# Patient Record
Sex: Female | Born: 1945 | Race: White | Hispanic: No | Marital: Married | Smoking: Former smoker
Health system: Southern US, Community
[De-identification: ages and names within clinical notes are randomized; demographics above are authoritative.]

## PROBLEM LIST (undated history)

## (undated) DIAGNOSIS — R079 Chest pain, unspecified: Secondary | ICD-10-CM

## (undated) DIAGNOSIS — Z86010 Personal history of colonic polyps: Secondary | ICD-10-CM

## (undated) DIAGNOSIS — G629 Polyneuropathy, unspecified: Secondary | ICD-10-CM

## (undated) DIAGNOSIS — E7800 Pure hypercholesterolemia, unspecified: Secondary | ICD-10-CM

## (undated) DIAGNOSIS — R0609 Other forms of dyspnea: Secondary | ICD-10-CM

## (undated) DIAGNOSIS — R0989 Other specified symptoms and signs involving the circulatory and respiratory systems: Secondary | ICD-10-CM

## (undated) DIAGNOSIS — H9319 Tinnitus, unspecified ear: Secondary | ICD-10-CM

## (undated) DIAGNOSIS — K5790 Diverticulosis of intestine, part unspecified, without perforation or abscess without bleeding: Secondary | ICD-10-CM

## (undated) DIAGNOSIS — N959 Unspecified menopausal and perimenopausal disorder: Secondary | ICD-10-CM

## (undated) DIAGNOSIS — G9009 Other idiopathic peripheral autonomic neuropathy: Secondary | ICD-10-CM

## (undated) HISTORY — DX: Other forms of dyspnea: R06.09

## (undated) HISTORY — PX: ABDOMINAL HYSTERECTOMY: SHX81

## (undated) HISTORY — PX: LUMBAR LAMINECTOMY: SHX95

## (undated) HISTORY — DX: Diverticulosis of intestine, part unspecified, without perforation or abscess without bleeding: K57.90

## (undated) HISTORY — DX: Pure hypercholesterolemia, unspecified: E78.00

## (undated) HISTORY — DX: Chest pain, unspecified: R07.9

## (undated) HISTORY — PX: NASAL SINUS SURGERY: SHX719

## (undated) HISTORY — DX: Other specified symptoms and signs involving the circulatory and respiratory systems: R09.89

## (undated) HISTORY — DX: Unspecified menopausal and perimenopausal disorder: N95.9

## (undated) HISTORY — DX: Tinnitus, unspecified ear: H93.19

## (undated) HISTORY — DX: Personal history of colonic polyps: Z86.010

## (undated) HISTORY — PX: SHOULDER SURGERY: SHX246

## (undated) HISTORY — DX: Other idiopathic peripheral autonomic neuropathy: G90.09

## (undated) HISTORY — DX: Polyneuropathy, unspecified: G62.9

---

## 1999-12-13 ENCOUNTER — Encounter: Payer: Self-pay | Admitting: Neurosurgery

## 1999-12-13 ENCOUNTER — Ambulatory Visit (HOSPITAL_COMMUNITY): Admission: RE | Admit: 1999-12-13 | Discharge: 1999-12-13 | Payer: Self-pay | Admitting: Neurosurgery

## 2000-01-03 ENCOUNTER — Encounter: Payer: Self-pay | Admitting: Neurosurgery

## 2000-01-07 ENCOUNTER — Encounter: Payer: Self-pay | Admitting: Neurosurgery

## 2000-01-07 ENCOUNTER — Inpatient Hospital Stay (HOSPITAL_COMMUNITY): Admission: RE | Admit: 2000-01-07 | Discharge: 2000-01-08 | Payer: Self-pay | Admitting: Neurosurgery

## 2005-01-02 ENCOUNTER — Ambulatory Visit: Payer: Self-pay | Admitting: Internal Medicine

## 2005-01-31 ENCOUNTER — Ambulatory Visit: Payer: Self-pay | Admitting: Internal Medicine

## 2005-08-01 ENCOUNTER — Ambulatory Visit: Payer: Self-pay | Admitting: Internal Medicine

## 2006-02-05 ENCOUNTER — Ambulatory Visit: Payer: Self-pay | Admitting: Internal Medicine

## 2006-11-20 ENCOUNTER — Ambulatory Visit: Payer: Self-pay | Admitting: Internal Medicine

## 2006-11-20 LAB — CONVERTED CEMR LAB
ALT: 19 units/L (ref 0–40)
AST: 23 units/L (ref 0–37)
Albumin: 3.6 g/dL (ref 3.5–5.2)
Alkaline Phosphatase: 59 units/L (ref 39–117)
BUN: 16 mg/dL (ref 6–23)
Basophils Absolute: 0.1 10*3/uL (ref 0.0–0.1)
Basophils Relative: 1.2 % — ABNORMAL HIGH (ref 0.0–1.0)
Bilirubin, Direct: 0.1 mg/dL (ref 0.0–0.3)
CO2: 28 meq/L (ref 19–32)
Calcium: 10.9 mg/dL — ABNORMAL HIGH (ref 8.4–10.5)
Chloride: 104 meq/L (ref 96–112)
Cholesterol: 241 mg/dL (ref 0–200)
Creatinine, Ser: 0.7 mg/dL (ref 0.4–1.2)
Direct LDL: 152.7 mg/dL
Eosinophils Absolute: 0.2 10*3/uL (ref 0.0–0.6)
Eosinophils Relative: 3.6 % (ref 0.0–5.0)
GFR calc Af Amer: 109 mL/min
GFR calc non Af Amer: 90 mL/min
Glucose, Bld: 98 mg/dL (ref 70–99)
HCT: 34.9 % — ABNORMAL LOW (ref 36.0–46.0)
HDL: 59.4 mg/dL (ref >39.0)
Hemoglobin: 12.2 g/dL (ref 12.0–15.0)
Lymphocytes Relative: 36.5 % (ref 12.0–46.0)
MCHC: 34.9 g/dL (ref 30.0–36.0)
MCV: 91.1 fL (ref 78.0–100.0)
Monocytes Absolute: 0.6 10*3/uL (ref 0.2–0.7)
Monocytes Relative: 9.8 % (ref 3.0–11.0)
Neutro Abs: 3.2 10*3/uL (ref 1.4–7.7)
Neutrophils Relative %: 48.9 % (ref 43.0–77.0)
Platelets: 235 10*3/uL (ref 150–400)
Potassium: 4.8 meq/L (ref 3.5–5.1)
RBC: 3.83 M/uL — ABNORMAL LOW (ref 3.87–5.11)
RDW: 12.9 % (ref 11.5–14.6)
Sodium: 144 meq/L (ref 135–145)
TSH: 3.59 microintl units/mL (ref 0.35–5.50)
Total Bilirubin: 1.1 mg/dL (ref 0.3–1.2)
Total CHOL/HDL Ratio: 4.1
Total Protein: 7.1 g/dL (ref 6.0–8.3)
Triglycerides: 213 mg/dL (ref 0–149)
VLDL: 43 mg/dL — ABNORMAL HIGH (ref 0–40)
WBC: 6.4 10*3/uL (ref 4.5–10.5)

## 2006-11-27 ENCOUNTER — Ambulatory Visit: Payer: Self-pay | Admitting: Internal Medicine

## 2006-12-15 ENCOUNTER — Ambulatory Visit: Payer: Self-pay | Admitting: Internal Medicine

## 2006-12-15 LAB — CONVERTED CEMR LAB
Gamma Globulin: 15.9 % (ref 11.1–18.8)
Hgb A1c MFr Bld: 5.8 % (ref 4.6–6.0)
Vitamin B-12: 528 pg/mL (ref 211–911)

## 2006-12-17 ENCOUNTER — Encounter: Payer: Self-pay | Admitting: Internal Medicine

## 2007-01-15 ENCOUNTER — Ambulatory Visit: Payer: Self-pay | Admitting: Internal Medicine

## 2007-05-24 DIAGNOSIS — Z860100 Personal history of colon polyps, unspecified: Secondary | ICD-10-CM | POA: Insufficient documentation

## 2007-05-24 HISTORY — DX: Personal history of colonic polyps: Z86.010

## 2007-05-24 HISTORY — DX: Personal history of colon polyps, unspecified: Z86.0100

## 2007-06-06 ENCOUNTER — Emergency Department (HOSPITAL_COMMUNITY): Admission: EM | Admit: 2007-06-06 | Discharge: 2007-06-06 | Payer: Self-pay | Admitting: Family Medicine

## 2007-06-21 ENCOUNTER — Telehealth: Payer: Self-pay | Admitting: Internal Medicine

## 2007-06-28 ENCOUNTER — Telehealth: Payer: Self-pay | Admitting: Internal Medicine

## 2007-08-27 ENCOUNTER — Telehealth: Payer: Self-pay | Admitting: Internal Medicine

## 2007-11-08 ENCOUNTER — Telehealth: Payer: Self-pay | Admitting: Internal Medicine

## 2007-11-17 ENCOUNTER — Ambulatory Visit: Payer: Self-pay | Admitting: Internal Medicine

## 2007-11-17 ENCOUNTER — Telehealth: Payer: Self-pay | Admitting: Internal Medicine

## 2007-11-17 DIAGNOSIS — G9009 Other idiopathic peripheral autonomic neuropathy: Secondary | ICD-10-CM | POA: Insufficient documentation

## 2007-11-17 HISTORY — DX: Other idiopathic peripheral autonomic neuropathy: G90.09

## 2007-12-24 ENCOUNTER — Ambulatory Visit: Payer: Self-pay | Admitting: Internal Medicine

## 2008-04-12 ENCOUNTER — Ambulatory Visit: Payer: Self-pay | Admitting: Internal Medicine

## 2008-04-12 DIAGNOSIS — N959 Unspecified menopausal and perimenopausal disorder: Secondary | ICD-10-CM | POA: Insufficient documentation

## 2008-04-12 HISTORY — DX: Unspecified menopausal and perimenopausal disorder: N95.9

## 2008-04-13 ENCOUNTER — Telehealth: Payer: Self-pay | Admitting: Internal Medicine

## 2008-06-22 ENCOUNTER — Telehealth: Payer: Self-pay | Admitting: Internal Medicine

## 2008-09-06 ENCOUNTER — Telehealth: Payer: Self-pay | Admitting: Internal Medicine

## 2008-09-18 ENCOUNTER — Ambulatory Visit: Payer: Self-pay | Admitting: Internal Medicine

## 2008-09-18 DIAGNOSIS — E7800 Pure hypercholesterolemia, unspecified: Secondary | ICD-10-CM | POA: Insufficient documentation

## 2008-09-18 HISTORY — DX: Pure hypercholesterolemia, unspecified: E78.00

## 2008-09-18 LAB — CONVERTED CEMR LAB: AST: 21 units/L (ref 0–37)

## 2008-11-16 ENCOUNTER — Emergency Department (HOSPITAL_COMMUNITY): Admission: EM | Admit: 2008-11-16 | Discharge: 2008-11-16 | Payer: Self-pay | Admitting: Emergency Medicine

## 2008-11-20 ENCOUNTER — Ambulatory Visit: Payer: Self-pay | Admitting: Internal Medicine

## 2008-11-20 DIAGNOSIS — R079 Chest pain, unspecified: Secondary | ICD-10-CM | POA: Insufficient documentation

## 2008-11-20 HISTORY — DX: Chest pain, unspecified: R07.9

## 2008-11-22 ENCOUNTER — Telehealth: Payer: Self-pay | Admitting: Internal Medicine

## 2008-11-23 ENCOUNTER — Ambulatory Visit: Payer: Self-pay | Admitting: Internal Medicine

## 2008-11-27 ENCOUNTER — Telehealth: Payer: Self-pay | Admitting: Internal Medicine

## 2008-11-30 ENCOUNTER — Ambulatory Visit: Payer: Self-pay | Admitting: Internal Medicine

## 2008-11-30 DIAGNOSIS — H9319 Tinnitus, unspecified ear: Secondary | ICD-10-CM | POA: Insufficient documentation

## 2008-11-30 HISTORY — DX: Tinnitus, unspecified ear: H93.19

## 2008-12-06 ENCOUNTER — Telehealth: Payer: Self-pay | Admitting: Internal Medicine

## 2008-12-11 ENCOUNTER — Telehealth: Payer: Self-pay | Admitting: Internal Medicine

## 2008-12-12 ENCOUNTER — Telehealth (INDEPENDENT_AMBULATORY_CARE_PROVIDER_SITE_OTHER): Payer: Self-pay | Admitting: *Deleted

## 2008-12-19 ENCOUNTER — Ambulatory Visit: Payer: Self-pay | Admitting: Internal Medicine

## 2008-12-19 DIAGNOSIS — R0609 Other forms of dyspnea: Secondary | ICD-10-CM

## 2008-12-19 DIAGNOSIS — R0600 Dyspnea, unspecified: Secondary | ICD-10-CM | POA: Insufficient documentation

## 2008-12-19 DIAGNOSIS — R0989 Other specified symptoms and signs involving the circulatory and respiratory systems: Secondary | ICD-10-CM

## 2008-12-19 DIAGNOSIS — R0689 Other abnormalities of breathing: Secondary | ICD-10-CM | POA: Insufficient documentation

## 2008-12-19 HISTORY — DX: Other specified symptoms and signs involving the circulatory and respiratory systems: R09.89

## 2008-12-19 HISTORY — DX: Other specified symptoms and signs involving the circulatory and respiratory systems: R06.09

## 2008-12-20 ENCOUNTER — Encounter: Payer: Self-pay | Admitting: Internal Medicine

## 2009-01-05 ENCOUNTER — Encounter: Payer: Self-pay | Admitting: Internal Medicine

## 2009-01-05 ENCOUNTER — Telehealth: Payer: Self-pay | Admitting: Internal Medicine

## 2009-01-08 ENCOUNTER — Telehealth: Payer: Self-pay | Admitting: Internal Medicine

## 2009-01-16 ENCOUNTER — Encounter: Payer: Self-pay | Admitting: Internal Medicine

## 2009-02-14 ENCOUNTER — Telehealth: Payer: Self-pay | Admitting: Internal Medicine

## 2009-02-16 ENCOUNTER — Encounter: Payer: Self-pay | Admitting: Internal Medicine

## 2009-03-08 ENCOUNTER — Encounter: Payer: Self-pay | Admitting: Internal Medicine

## 2009-04-03 ENCOUNTER — Encounter: Admission: RE | Admit: 2009-04-03 | Discharge: 2009-04-03 | Payer: Self-pay | Admitting: Neurology

## 2009-04-10 ENCOUNTER — Ambulatory Visit: Payer: Self-pay | Admitting: Internal Medicine

## 2009-04-10 LAB — CONVERTED CEMR LAB
ALT: 23 units/L (ref 0–35)
AST: 25 units/L (ref 0–37)
Alkaline Phosphatase: 71 units/L (ref 39–117)
Basophils Relative: 0.9 % (ref 0.0–3.0)
Bilirubin, Direct: 0.2 mg/dL (ref 0.0–0.3)
Calcium: 10.5 mg/dL (ref 8.4–10.5)
Creatinine, Ser: 0.7 mg/dL (ref 0.4–1.2)
Eosinophils Relative: 5.7 % — ABNORMAL HIGH (ref 0.0–5.0)
HDL: 51.9 mg/dL (ref >39.00)
Hemoglobin: 12 g/dL (ref 12.0–15.0)
LDL Cholesterol: 96 mg/dL (ref 0–99)
Lymphocytes Relative: 32.3 % (ref 12.0–46.0)
Monocytes Relative: 14 % — ABNORMAL HIGH (ref 3.0–12.0)
Neutro Abs: 2.6 10*3/uL (ref 1.4–7.7)
Neutrophils Relative %: 47.1 % (ref 43.0–77.0)
RBC: 3.82 M/uL — ABNORMAL LOW (ref 3.87–5.11)
Sodium: 145 meq/L (ref 135–145)
Total CHOL/HDL Ratio: 3
Total Protein: 6.9 g/dL (ref 6.0–8.3)
Triglycerides: 117 mg/dL (ref 0.0–149.0)
WBC: 5.6 10*3/uL (ref 4.5–10.5)

## 2009-04-18 ENCOUNTER — Encounter: Payer: Self-pay | Admitting: Internal Medicine

## 2009-04-19 ENCOUNTER — Ambulatory Visit: Payer: Self-pay | Admitting: Internal Medicine

## 2009-05-23 ENCOUNTER — Telehealth (INDEPENDENT_AMBULATORY_CARE_PROVIDER_SITE_OTHER): Payer: Self-pay | Admitting: *Deleted

## 2009-05-25 ENCOUNTER — Telehealth: Payer: Self-pay | Admitting: Internal Medicine

## 2009-07-30 ENCOUNTER — Telehealth: Payer: Self-pay | Admitting: Internal Medicine

## 2009-08-20 ENCOUNTER — Telehealth: Payer: Self-pay | Admitting: Internal Medicine

## 2009-11-02 ENCOUNTER — Ambulatory Visit: Payer: Self-pay | Admitting: Internal Medicine

## 2009-11-02 DIAGNOSIS — J069 Acute upper respiratory infection, unspecified: Secondary | ICD-10-CM | POA: Insufficient documentation

## 2010-02-26 ENCOUNTER — Encounter (INDEPENDENT_AMBULATORY_CARE_PROVIDER_SITE_OTHER): Payer: Self-pay | Admitting: *Deleted

## 2010-03-29 ENCOUNTER — Encounter (INDEPENDENT_AMBULATORY_CARE_PROVIDER_SITE_OTHER): Payer: Self-pay | Admitting: *Deleted

## 2010-04-02 ENCOUNTER — Ambulatory Visit: Payer: Self-pay | Admitting: Gastroenterology

## 2010-04-23 ENCOUNTER — Ambulatory Visit: Payer: Self-pay | Admitting: Gastroenterology

## 2010-04-24 ENCOUNTER — Encounter: Payer: Self-pay | Admitting: Gastroenterology

## 2010-05-02 LAB — HM COLONOSCOPY

## 2010-05-17 ENCOUNTER — Ambulatory Visit: Payer: Self-pay | Admitting: Internal Medicine

## 2010-05-17 DIAGNOSIS — T6391XA Toxic effect of contact with unspecified venomous animal, accidental (unintentional), initial encounter: Secondary | ICD-10-CM | POA: Insufficient documentation

## 2010-06-13 ENCOUNTER — Ambulatory Visit: Payer: Self-pay | Admitting: Internal Medicine

## 2010-06-13 LAB — CONVERTED CEMR LAB
ALT: 16 units/L (ref 0–35)
Alkaline Phosphatase: 58 units/L (ref 39–117)
Basophils Relative: 1.4 % (ref 0.0–3.0)
Bilirubin, Direct: 0.2 mg/dL (ref 0.0–0.3)
Chloride: 104 meq/L (ref 96–112)
Creatinine, Ser: 0.8 mg/dL (ref 0.4–1.2)
Eosinophils Relative: 3.4 % (ref 0.0–5.0)
Hemoglobin: 12.4 g/dL (ref 12.0–15.0)
LDL Cholesterol: 99 mg/dL (ref 0–99)
Lymphocytes Relative: 39.9 % (ref 12.0–46.0)
MCV: 91.2 fL (ref 78.0–100.0)
Monocytes Absolute: 0.5 10*3/uL (ref 0.1–1.0)
Neutrophils Relative %: 45.3 % (ref 43.0–77.0)
RBC: 3.98 M/uL (ref 3.87–5.11)
Sodium: 141 meq/L (ref 135–145)
Total CHOL/HDL Ratio: 3
Total Protein: 7.1 g/dL (ref 6.0–8.3)
Triglycerides: 168 mg/dL — ABNORMAL HIGH (ref 0.0–149.0)
WBC: 5.1 10*3/uL (ref 4.5–10.5)

## 2010-07-01 ENCOUNTER — Telehealth: Payer: Self-pay | Admitting: Internal Medicine

## 2010-07-04 ENCOUNTER — Encounter: Payer: Self-pay | Admitting: Internal Medicine

## 2010-07-09 ENCOUNTER — Encounter: Payer: Self-pay | Admitting: Internal Medicine

## 2010-07-10 ENCOUNTER — Encounter: Payer: Self-pay | Admitting: Internal Medicine

## 2010-07-10 ENCOUNTER — Telehealth: Payer: Self-pay | Admitting: Internal Medicine

## 2010-11-10 LAB — CONVERTED CEMR LAB
ALT: 22 units/L (ref 0–35)
Albumin: 3.6 g/dL (ref 3.5–5.2)
Alkaline Phosphatase: 47 units/L (ref 39–117)
BUN: 9 mg/dL (ref 6–23)
Bilirubin, Direct: 0.1 mg/dL (ref 0.0–0.3)
CO2: 30 meq/L (ref 19–32)
Calcium: 10.2 mg/dL (ref 8.4–10.5)
Cholesterol: 253 mg/dL (ref 0–200)
Eosinophils Relative: 3.5 % (ref 0.0–5.0)
GFR calc Af Amer: 109 mL/min
Glucose, Bld: 94 mg/dL (ref 70–99)
HDL: 51 mg/dL (ref >39.0)
MCHC: 34.6 g/dL (ref 30.0–36.0)
MCV: 92.9 fL (ref 78.0–100.0)
Monocytes Absolute: 0.6 10*3/uL (ref 0.1–1.0)
Neutro Abs: 2.7 10*3/uL (ref 1.4–7.7)
Neutrophils Relative %: 45.9 % (ref 43.0–77.0)
Platelets: 221 10*3/uL (ref 150–400)
RDW: 12.6 % (ref 11.5–14.6)
Sodium: 141 meq/L (ref 135–145)
TSH: 2.22 microintl units/mL (ref 0.35–5.50)
Total Protein: 7 g/dL (ref 6.0–8.3)
Triglycerides: 202 mg/dL (ref 0–149)

## 2010-11-12 NOTE — Assessment & Plan Note (Signed)
Summary: cpx/cjr/pt req to come in fasting/cjr   Vital Signs:  Patient profile:   65 year old female Height:      62 inches Weight:      154 pounds BMI:     28.27 Temp:     98.1 degrees F oral BP sitting:   110 / 70  (right arm) Cuff size:   regular  Vitals Entered By: Duard Brady LPN (June 13, 2010 8:43 AM) CC: cpx - doing well Is Patient Diabetic? No   CC:  cpx - doing well.  History of Present Illness: 65 year old patient who is seen today for a wellness exam.  She has a history of complex diverticular disease and is status post recent colonoscopy.  She has a history menopausal syndrome and also a history of idiopathic peripheral autonomic neuropathy.  She has chronic tinnitus.  Here for Medicare AWV:  1.   Risk factors based on Past M, S, F history:  risk factors include hypercholesterolemia.  She denies any cardiopulmonary complaints 2.   Physical Activities: remains active, walks daily,does  outdoor gardening 3.   Depression/mood: no history of depression or mood disorder 4.   Hearing: mild impairment.  She states this is being fitted for hearing aids.  She does have chronic tinnitus 5.   ADL's: independent in all aspects of daily living 6.   Fall Risk: low 7.   Home Safety: no problems identified 8.   Height, weight, &visual acuity:height and weight are stable with glasses, and no change in her visual acuity 9.   Counseling: exercise discussed and encouraged 10.   Labs ordered based on risk factors: laboratory  profile, including lipid panel will be reviewed 11.           Referral Coordination- mammogram, encouraged 12.           Care Plan- will continue calcium and vitamin D supplementation, more regular exercise regimen encouraged 13.            Cognitive Assessment- alert and oriented, with normal affect.  No difficulty handling all executive functions.  No memory deficit   Preventive Screening-Counseling & Management  Alcohol-Tobacco     Smoking  Status: never  Allergies: 1)  ! Sulfa  Past History:  Past Medical History: Reviewed history from 11/02/2009 and no changes required. Colonic polyps, hx of Mild Hypercholesterolemia complex diverticular disease peripheral neuropathy Tinnitus remote history of asthma  Past Surgical History: Hysterectomy 96 Lumbar laminectomy 01 Nasal sx Right shoulder sx Colonoscopy-09/07/2004, 2011 1996 history of diverticular abscess  Family History: Reviewed history from 04/19/2009 and no changes required. Fam hx MI Family History Diabetes 1st degree relative Family History Lung cancer Fam hx COPD  father died of an MI at age 88.  History of COPD mother died of lung cancer at 94  One brother, COPD, bladder  Ca (deceased)  Three sisters, one died at 3 weeks of age one sister died at 30 complications of diabetes  Social History: Reviewed history from 04/12/2008 and no changes required. Married discontinue smoking 13 years agoSmoking Status:  never  Review of Systems  The patient denies anorexia, fever, weight loss, weight gain, vision loss, decreased hearing, hoarseness, chest pain, syncope, dyspnea on exertion, peripheral edema, prolonged cough, headaches, hemoptysis, abdominal pain, melena, hematochezia, severe indigestion/heartburn, hematuria, incontinence, genital sores, muscle weakness, suspicious skin lesions, transient blindness, difficulty walking, depression, unusual weight change, abnormal bleeding, enlarged lymph nodes, angioedema, and breast masses.    Physical Exam  General:  Well-developed,well-nourished,in no acute distress; alert,appropriate and cooperative throughout examination Head:  Normocephalic and atraumatic without obvious abnormalities. No apparent alopecia or balding. Eyes:  No corneal or conjunctival inflammation noted. EOMI. Perrla. Funduscopic exam benign, without hemorrhages, exudates or papilledema. Vision grossly normal. Ears:  External ear exam  shows no significant lesions or deformities.  Otoscopic examination reveals clear canals, tympanic membranes are intact bilaterally without bulging, retraction, inflammation or discharge. Hearing is grossly normal bilaterally. Nose:  External nasal examination shows no deformity or inflammation. Nasal mucosa are pink and moist without lesions or exudates. Mouth:  Oral mucosa and oropharynx without lesions or exudates.  Teeth in good repair. Neck:  No deformities, masses, or tenderness noted. Chest Wall:  No deformities, masses, or tenderness noted. Breasts:  No mass, nodules, thickening, tenderness, bulging, retraction, inflamation, nipple discharge or skin changes noted.   Lungs:  Normal respiratory effort, chest expands symmetrically. Lungs are clear to auscultation, no crackles or wheezes. Heart:  Normal rate and regular rhythm. S1 and S2 normal without gallop, murmur, click, rub or other extra sounds. Abdomen:  Bowel sounds positive,abdomen soft and non-tender without masses, organomegaly or hernias noted. Msk:  No deformity or scoliosis noted of thoracic or lumbar spine.   Pulses:  dorsalis pedis pulses, full, posterior tibia pulses faint Extremities:  No clubbing, cyanosis, edema, or deformity noted with normal full range of motion of all joints.   Neurologic:  No cranial nerve deficits noted. Station and gait are normal. Plantar reflexes are down-going bilaterally. DTRs are symmetrical throughout. Sensory, motor and coordinative functions appear intact. Skin:  Intact without suspicious lesions or rashes Cervical Nodes:  No lymphadenopathy noted Axillary Nodes:  No palpable lymphadenopathy Inguinal Nodes:  No significant adenopathy Psych:  Cognition and judgment appear intact. Alert and cooperative with normal attention span and concentration. No apparent delusions, illusions, hallucinations   Impression & Recommendations:  Problem # 1:  PREVENTIVE HEALTH CARE  (ICD-V70.0)  Orders: Medicare -1st Annual Wellness Visit [phone]) TLB-BMP (Basic Metabolic Panel-BMET) (80048-METABOL) TLB-CBC Platelet - w/Differential (85025-CBCD) TLB-Hepatic/Liver Function Pnl (80076-HEPATIC) Specimen Handling (95621)  Complete Medication List: 1)  Simvastatin 40 Mg Tabs (Simvastatin) .Marland Kitchen.. 1 once daily 2)  Tramadol Hcl 50 Mg Tabs (Tramadol hcl) .... One every 8 hours as needed for pain 3)  Ventolin Hfa 108 (90 Base) Mcg/act Aers (Albuterol sulfate) .... Two puffs every 6 hours as needed for wheezing or shortness of breath 4)  Caltrate 600+d 600-400 Mg-unit Tabs (Calcium carbonate-vitamin d) .... 2 by mouth qd  Other Orders: Venipuncture (30865) TLB-Lipid Panel (80061-LIPID) TLB-TSH (Thyroid Stimulating Hormone) (78469-GEX)  Patient Instructions: 1)  Please schedule a follow-up appointment in 1 year. 2)  Limit your Sodium (Salt) to less than 2 grams a day(slightly less than 1/2 a teaspoon) to prevent fluid retention, swelling, or worsening of symptoms. 3)  It is important that you exercise regularly at least 20 minutes 5 times a week. If you develop chest pain, have severe difficulty breathing, or feel very tired , stop exercising immediately and seek medical attention. Prescriptions: VENTOLIN HFA 108 (90 BASE) MCG/ACT AERS (ALBUTEROL SULFATE) two puffs every 6 hours as needed for wheezing or shortness of breath  #1 x 4   Entered and Authorized by:   Gordy Savers  MD   Signed by:   Gordy Savers  MD on 06/13/2010   Method used:   Electronically to        [facility] [phone]* (retail)  [address]       [place], [place]  [address]       Ph: [phone]       Fax: [phone]   RxID:   [id number] TRAMADOL HCL 50 MG TABS (TRAMADOL HCL) one every 8 hours as needed for pain  #180 x 6   Entered and Authorized by:   Gordy Savers  MD   Signed by:   Gordy Savers  MD on 06/13/2010   Method used:   Electronically to         [facility] [phone]* (retail)       [address]       [place], [place]  [address]       Ph: [phone]       Fax: [phone]   RxID:   [id number] SIMVASTATIN 40 MG  TABS (SIMVASTATIN) 1 once daily  #90 x 6   Entered and Authorized by:   Gordy Savers  MD   Signed by:   Gordy Savers  MD on 06/13/2010   Method used:   Electronically to        [facility] [phone]* (retail)       [address]       [place], [place]  [address]       Ph: [phone]       Fax: [phone]   RxID:   [id number]

## 2010-11-12 NOTE — Procedures (Signed)
Summary: Colonoscopy  Patient: [name] Note: All result statuses are Final unless otherwise noted.  Tests: (1) Colonoscopy (COL)   COL Colonoscopy           DONE     [facility]     [address]     [place], [place]  [address]           COLONOSCOPY PROCEDURE REPORT           PATIENT:  Susan, Collins  MR#:  [id number]     BIRTHDATE:  1946-10-05, 63 yrs. old  GENDER:  female     ENDOSCOPIST:  Judie Petit T. Russella Dar, MD, [facility]           PROCEDURE DATE:  04/23/2010     PROCEDURE:  Colonoscopy with snare polypectomy     ASA CLASS:  Class II     INDICATIONS:  1) Routine Risk Screening     MEDICATIONS:   Fentanyl 100 mcg IV, Versed 10 mg IV     DESCRIPTION OF PROCEDURE:   After the risks benefits and     alternatives of the procedure were thoroughly explained, informed     consent was obtained.  Digital rectal exam was performed and     revealed moderate external hemorrhoids. The LB [id number]-[id number]     endoscope was introduced through the anus and advanced to the     cecum, which was identified by both the appendix and ileocecal     valve, without limitations.  The quality of the prep was     excellent, using MoviPrep.  The instrument was then slowly     withdrawn as the colon was fully examined.     <<PROCEDUREIMAGES>>     FINDINGS:  A sessile polyp was found in the sigmoid colon. It was     4 mm in size. Polyp was snared without cautery. Retrieval was     successful.  A normal appearing cecum, ileocecal valve, and     appendiceal orifice were identified. The ascending, hepatic     flexure, transverse, splenic flexure, descending colon, and rectum     appeared unremarkable. Retroflexed views in the rectum revealed no     abnormalities. The time to cecum =  8.75  minutes. The scope was     then withdrawn (time =  9.67  min) from the patient and the     procedure completed.           COMPLICATIONS:  None           ENDOSCOPIC IMPRESSION:     1) 4 mm sessile polyp in the  sigmoid colon     2) External hemorrhoids           RECOMMENDATIONS:     1) Await pathology results     2) If the polyp removed today is adenomatous (pre-cancerous),     you will need a repeat colonoscopy in 5 years. Otherwise you     should continue to follow colorectal cancer screening guidelines     for "routine risk" patients with colonoscopy in 10 years.     Susan Lick. Russella Dar, MD, [facility]           CC: Gordy Savers, MD           n.     Susan DoctorVenita Lick. Susan Collins at 04/23/2010 12:16 PM           Jaymes Graff, [id number]  Note: An exclamation  mark (!) indicates a result that was not dispersed into the flowsheet. Document Creation Date: 04/23/2010 12:18 PM _______________________________________________________________________  (1) Order result status: Final Collection or observation date-time: 04/23/2010 12:13 Requested date-time:  Receipt date-time:  Reported date-time:  Referring Physician:   Ordering Physician: Claudette Head [phone]) Specimen Source:  Source: Launa Grill Order Number: [id number] Lab site:   Appended Document: Colonoscopy     Procedures Next Due Date:    Colonoscopy: 04/2020

## 2010-11-12 NOTE — Assessment & Plan Note (Signed)
Summary: chest congestion/cough/dm   Vital Signs:  Patient profile:   65 year old female Weight:      152 pounds O2 Sat:      96 % on Room air Temp:     97.7 degrees F oral Pulse rate:   80 / minute Pulse rhythm:   regular BP sitting:   110 / 60  (left arm) Cuff size:   regular  Vitals Entered By: Raechel Ache, RN (November 02, 2009 3:52 PM)  O2 Flow:  Room air CC: C/o SOB, wheezing, headache and dry cough   CC:  C/o SOB, wheezing, and headache and dry cough.  History of Present Illness: 65 year old patient has remote history of asthma.  For the past few days, she has developed URI symptoms of increasing shortness of breath and wheezing.  She has noted a dry, nonproductive cough.  Denies any fever or sputum production.  She has been using albuterol metered-dose inhaler with some benefit.  She has noted some palpitations associated with its use.  Allergies: No Known Drug Allergies  Past History:  Past Medical History: Colonic polyps, hx of Mild Hypercholesterolemia complex diverticular disease peripheral neuropathy Tinnitus remote history of asthma  Review of Systems       The patient complains of anorexia, hoarseness, and prolonged cough.  The patient denies fever, weight loss, weight gain, vision loss, decreased hearing, chest pain, syncope, dyspnea on exertion, peripheral edema, headaches, hemoptysis, abdominal pain, melena, hematochezia, severe indigestion/heartburn, hematuria, incontinence, genital sores, muscle weakness, suspicious skin lesions, transient blindness, difficulty walking, depression, unusual weight change, abnormal bleeding, enlarged lymph nodes, angioedema, and breast masses.    Physical Exam  General:  Well-developed,well-nourished,in no acute distress; alert,appropriate and cooperative throughout examination Head:  Normocephalic and atraumatic without obvious abnormalities. No apparent alopecia or balding. Eyes:  No corneal or conjunctival  inflammation noted. EOMI. Perrla. Funduscopic exam benign, without hemorrhages, exudates or papilledema. Vision grossly normal. Mouth:  Oral mucosa and oropharynx without lesions or exudates.  Teeth in good repair. Neck:  No deformities, masses, or tenderness noted. Lungs:  faint expiratory wheezesnormal respiratory effort, no intercostal retractions, and no accessory muscle use. oxygen  saturation 96%normal respiratory effort, no intercostal retractions, and no accessory muscle use.   Heart:  Normal rate and regular rhythm. S1 and S2 normal without gallop, murmur, click, rub or other extra sounds.  tachycardia Abdomen:  Bowel sounds positive,abdomen soft and non-tender without masses, organomegaly or hernias noted. Extremities:  no edema   Impression & Recommendations:  Problem # 1:  URI (ICD-465.9)  The following medications were removed from the medication list:    Celebrex 200 Mg Caps (Celecoxib) ..... One capsule once or twice daily Her updated medication list for this problem includes:    Meloxicam 15 Mg Tabs (Meloxicam) ..... One daily    Hydrocodone-homatropine 5-1.5 Mg/36ml Syrp (Hydrocodone-homatropine) .Marland Kitchen... 1 teaspoon every 6 hours as needed for cough patient has viral associated asthmatic bronchitis and will treat with a nebulizer treatment and refill her Ventolin metered-dose inhaler.  He was given an injection of Depo-Medrol.  Will place on expectorant and clinically observed  Orders: Depo- Medrol 80mg  (J1040) Admin of Therapeutic Inj  intramuscular or subcutaneous ([address])  Problem # 2:  DYSPNEA ON EXERTION (ICD-786.09)  Her updated medication list for this problem includes:    Ventolin Hfa 108 (90 Base) Mcg/act Aers (Albuterol sulfate) .Marland Kitchen..Marland Kitchen Two puffs every 6 hours as needed for wheezing or shortness of breath  Her updated medication  list for this problem includes:    Ventolin Hfa 108 (90 Base) Mcg/act Aers (Albuterol sulfate) .Marland Kitchen..Marland Kitchen Two puffs every 6 hours as needed for  wheezing or shortness of breath  Complete Medication List: 1)  Meloxicam 15 Mg Tabs (Meloxicam) .... One daily 2)  Simvastatin 40 Mg Tabs (Simvastatin) .Marland Kitchen.. 1 once daily 3)  Tramadol Hcl 50 Mg Tabs (Tramadol hcl) .... One every 8 hours as needed for pain 4)  Ventolin Hfa 108 (90 Base) Mcg/act Aers (Albuterol sulfate) .... Two puffs every 6 hours as needed for wheezing or shortness of breath 5)  Hydrocodone-homatropine 5-1.5 Mg/14ml Syrp (Hydrocodone-homatropine) .Marland Kitchen.. 1 teaspoon every 6 hours as needed for cough  Patient Instructions: 1)  Get plenty of rest, drink lots of clear liquids, and use  Mucinex twice daily 2)  ventolin 2 puffs every 6 hours 3)  Please schedule a follow-up appointment as needed. Prescriptions: HYDROCODONE-HOMATROPINE 5-1.5 MG/5ML SYRP (HYDROCODONE-HOMATROPINE) 1 teaspoon every 6 hours as needed for cough  #6 oz x 0   Entered and Authorized by:   Gordy Savers  MD   Signed by:   Gordy Savers  MD on 11/02/2009   Method used:   Print then Give to Patient   RxID:   [id number] VENTOLIN HFA 108 (90 BASE) MCG/ACT AERS (ALBUTEROL SULFATE) two puffs every 6 hours as needed for wheezing or shortness of breath  #1 x 4   Entered and Authorized by:   Gordy Savers  MD   Signed by:   Gordy Savers  MD on 11/02/2009   Method used:   Print then Give to Patient   RxID:   [id number]    Medication Administration  Injection # 1:    Medication: Depo- Medrol 80mg     Diagnosis: URI (ICD-465.9)    Route: IM    Site: L deltoid    Exp Date: 10/11    Lot #: [id number]    Mfr: teva    Patient tolerated injection without complications    Given by: Raechel Ache, RN (November 02, 2009 4:53 PM)  Orders Added: 1)  Est. Patient Level III [284[address]] 2)  Depo- Medrol 80mg  [J1040] 3)  Admin of Therapeutic Inj  intramuscular or subcutaneous [[address]]

## 2010-11-12 NOTE — Assessment & Plan Note (Signed)
Summary: INFECTED BUG STING/PS   Vital Signs:  Patient profile:   65 year old female Weight:      154 pounds Temp:     98.0 degrees F oral BP sitting:   120 / 64  (left arm) Cuff size:   regular  Vitals Entered By: Duard Brady LPN (May 17, 2010 2:24 PM) CC: insect bite to (R) upper arm - red and swelling Is Patient Diabetic? No   CC:  insect bite to (R) upper arm - red and swelling.  History of Present Illness: 65 year old patient who is seen 24 hours after an apparent bee sting involving her right upper arm.  There is been some persistent pain and localized soft tissue swelling.  There's been no fever, shortness of breath, wheezing, or any other systemic complaints. she has a history of treated hypercholesterolemia, which has been stable.  She has been on simvastatin 40 mg daily, which she continues to tolerate well  Allergies: 1)  ! Sulfa  Physical Exam  General:  Well-developed,well-nourished,in no acute distress; alert,appropriate and cooperative throughout examination Lungs:  Normal respiratory effort, chest expands symmetrically. Lungs are clear to auscultation, no crackles or wheezes. Skin:  small puncture wound involving the right upper inner arm with some surrounding erythema and soft tissue swelling   Impression & Recommendations:  Problem # 1:  BEE STING REACTION, LOCAL (ICD-989.5)  Problem # 2:  HYPERCHOLESTEROLEMIA (ICD-272.0)  Her updated medication list for this problem includes:    Simvastatin 40 Mg Tabs (Simvastatin) .Marland Kitchen... 1 once daily  Complete Medication List: 1)  Simvastatin 40 Mg Tabs (Simvastatin) .Marland Kitchen.. 1 once daily 2)  Tramadol Hcl 50 Mg Tabs (Tramadol hcl) .... One every 8 hours as needed for pain 3)  Ventolin Hfa 108 (90 Base) Mcg/act Aers (Albuterol sulfate) .... Two puffs every 6 hours as needed for wheezing or shortness of breath  Patient Instructions: 1)  apply ice to the area until bedtime tonight 2)  Take 400-600mg  of Ibuprofen  (Advil, Motrin) with food every 4-6 hours as needed for relief of pain or comfort of fever. 3)  tramadol  okay to take for the discomfort

## 2010-11-12 NOTE — Letter (Signed)
Summary: Patient Notice-Hyperplastic Polyps  [facility]  [address] [place], [place] [address]   Phone: [phone]  Fax: [phone]        April 24, 2010 MRN: [id number]    [facility] [address] [place], [place]  [address]    Dear Susan Collins,  I am pleased to inform you that the colon polyp removed during your recent colonoscopy was (were) found to be hyperplastic. These types of polyps are NOT pre-cancerous.  It is therefore my recommendation that you have a repeat colonoscopy examination in 10 years for routine colorectal cancer screening.  Should you develop new or worsening symptoms of abdominal pain, bowel habit changes or bleeding from the rectum or bowels, please schedule an evaluation with either your primary care physician or with me.  Continue treatment plan as outlined the day of your exam.  Please call us if you are having persistent problems or have questions about your condition that have not been fully answered at this time.  Sincerely,  Meryl Dare MD [facility]  This letter has been electronically signed by your physician.  Appended Document: Patient Notice-Hyperplastic Polyps letter mailed.

## 2010-11-12 NOTE — Miscellaneous (Signed)
Summary: REC COL...AS.  Clinical Lists Changes  Medications: Added new medication of MOVIPREP 100 GM  SOLR (PEG-KCL-NACL-NASULF-NA ASC-C) As per prep instructions. - Signed Rx of MOVIPREP 100 GM  SOLR (PEG-KCL-NACL-NASULF-NA ASC-C) As per prep instructions.;  #1 x 0;  Signed;  Entered by: Sherren Kerns RN;  Authorized by: Meryl Dare MD [facility];  Method used: Electronically to [facility] [phone]*, [address] [place], [place]  [address], Ph: [phone], Fax: [phone] Allergies: Added new allergy or adverse reaction of SULFA Observations: Added new observation of NKA: F (04/02/2010 8:50)    Prescriptions: MOVIPREP 100 GM  SOLR (PEG-KCL-NACL-NASULF-NA ASC-C) As per prep instructions.  #1 x 0   Entered by:   Sherren Kerns RN   Authorized by:   Meryl Dare MD [facility]   Signed by:   Sherren Kerns RN on 04/02/2010   Method used:   Electronically to        [facility] [phone]* (retail)       [address]       [place], [place]  [address]       Ph: [phone]       Fax: [phone]   RxID:   [id number]

## 2010-11-12 NOTE — Progress Notes (Signed)
Summary: wants Dr. Kirtland Bouchard to call her  Call back at Home Phone [phone]   Caller: vm Reason for Call: Talk to Nurse Summary of Call: Wants to Dr. Kirtland Bouchard to call her tomorrow about breast mammogram.   LMTCB with her question.  Rudy Jew, RN  July 10, 2010 4:49 PM Patient called back and said she'd like to talk to Dr. Kirtland Bouchard about mammogram she had today on her left breast.  Rudy Jew, RN  July 10, 2010 5:05 PM   Initial call taken by: Rudy Jew, RN,  July 10, 2010 1:26 PM    called

## 2010-11-12 NOTE — Letter (Signed)
Summary: [facility] Instructions  [facility]  [address] [place], [place] [address]   Phone: [phone]  Fax: [phone]       VIRGINA DEAKINS    1945-12-17    MRN: [id number]        Procedure Day Dorna Bloom: Jake Shark  04/23/10     Arrival Time:  10:30am     Procedure Time:  11:30am     Location of Procedure:                    _ X_  [facility] (4th Floor) _                       PREPARATION FOR COLONOSCOPY WITH MOVIPREP   Starting 5 days prior to your procedure  THURSDAY 07/07 do not eat nuts, seeds, popcorn, corn, beans, peas,  salads, or any raw vegetables.  Do not take any fiber supplements (e.g. Metamucil, Citrucel, and Benefiber).  THE DAY BEFORE YOUR PROCEDURE         DATE:  MONDAY  07/11  1.  Drink clear liquids the entire day-NO SOLID FOOD  2.  Do not drink anything colored red or purple.  Avoid juices with pulp.  No orange juice.  3.  Drink at least 64 oz. (8 glasses) of fluid/clear liquids during the day to prevent dehydration and help the prep work efficiently.  CLEAR LIQUIDS INCLUDE: Water Jello Ice Popsicles Tea (sugar ok, no milk/cream) Powdered fruit flavored drinks Coffee (sugar ok, no milk/cream) Gatorade Juice: apple, white grape, white cranberry  Lemonade Clear bullion, consomm, broth Carbonated beverages (any kind) Strained chicken noodle soup Hard Candy                             4.  In the morning, mix first dose of MoviPrep solution:    Empty 1 Pouch A and 1 Pouch B into the disposable container    Add lukewarm drinking water to the top line of the container. Mix to dissolve    Refrigerate (mixed solution should be used within 24 hrs)  5.  Begin drinking the prep at 5:00 p.m. The MoviPrep container is divided by 4 marks.   Every 15 minutes drink the solution down to the next mark (approximately 8 oz) until the full liter is complete.   6.  Follow completed prep with 16 oz of clear liquid of your choice  (Nothing red or purple).  Continue to drink clear liquids until bedtime.  7.  Before going to bed, mix second dose of MoviPrep solution:    Empty 1 Pouch A and 1 Pouch B into the disposable container    Add lukewarm drinking water to the top line of the container. Mix to dissolve    Refrigerate  THE DAY OF YOUR PROCEDURE      DATE:  TUESDAY  07/12  Beginning at  6:30 a.m. (5 hours before procedure):         1. Every 15 minutes, drink the solution down to the next mark (approx 8 oz) until the full liter is complete.  2. Follow completed prep with 16 oz. of clear liquid of your choice.    3. You may drink clear liquids until  9:30am (2 HOURS BEFORE PROCEDURE).   MEDICATION INSTRUCTIONS  Unless otherwise instructed, you should take regular prescription medications with a small sip of water   as early  as possible the morning of your procedure.   Additional medication instructions: n/a         OTHER INSTRUCTIONS  You will need a responsible adult at least 67 years of age to accompany you and drive you home.   This person must remain in the waiting room during your procedure.  Wear loose fitting clothing that is easily removed.  Leave jewelry and other valuables at home.  However, you may wish to bring a book to read or  an iPod/MP3 player to listen to music as you wait for your procedure to start.  Remove all body piercing jewelry and leave at home.  Total time from sign-in until discharge is approximately 2-3 hours.  You should go home directly after your procedure and rest.  You can resume normal activities the  day after your procedure.  The day of your procedure you should not:   Drive   Make legal decisions   Operate machinery   Drink alcohol   Return to work  You will receive specific instructions about eating, activities and medications before you leave.    The above instructions have been reviewed and explained to me by   Sherren Kerns RN  April 02, 2010 9:39 AM     I fully understand and can verbalize these instructions _____________________________ Date _________

## 2010-11-12 NOTE — Progress Notes (Signed)
Summary: simvastatin  Phone Note Call from Patient Call back at [facility] Phone [phone]   Summary of Call: Does not want to take Vytorin.  Does not like the possible side effects of joint pain muscle pain, depression.  Wants to stay on simvastatin.  [employer] [facility].   Initial call taken by: Rudy Jew, RN,  July 01, 2010 8:07 AM  Follow-up for Phone Call        continue simvastatin Follow-up by: Gordy Savers  MD,  July 01, 2010 12:40 PM  Additional Follow-up for Phone Call Additional follow up Details #1::        LMTCB.  Husband says everything is squared away on this.   Additional Follow-up by: Rudy Jew, RN,  July 01, 2010 3:18 PM

## 2010-11-12 NOTE — Letter (Signed)
Summary: Colonoscopy Letter  [facility]  [address] [place], [place] [address]   Phone: [phone]  Fax: [phone]      Feb 26, 2010 MRN: [id number]   [facility] [address] [place], [place]  [address]   Dear Ms. DEITER,   According to your medical record, it is time for you to schedule a Colonoscopy. The American Cancer Society recommends this procedure as a method to detect early colon cancer. Patients with a family history of colon cancer, or a personal history of colon polyps or inflammatory bowel disease are at increased risk.  This letter has beeen generated based on the recommendations made at the time of your procedure. If you feel that in your particular situation this may no longer apply, please contact our office.  Please call our office at [phone] to schedule this appointment or to update your records at your earliest convenience.  Thank you for cooperating with [place] to provide you with the very best care possible.   Sincerely,  Judie Petit T. Russella Dar, M.D.  [facility] [phone]

## 2011-01-15 ENCOUNTER — Encounter: Payer: Self-pay | Admitting: Internal Medicine

## 2011-02-26 ENCOUNTER — Telehealth: Payer: Self-pay | Admitting: *Deleted

## 2011-02-26 NOTE — Telephone Encounter (Signed)
Pt. Has been advised to have cataract surgery, and does not want to do this.  Is asking if she could take vitamins that would help such as Vit E and Vit C.?

## 2011-02-27 NOTE — Telephone Encounter (Signed)
Voice mail is full  

## 2011-02-27 NOTE — Telephone Encounter (Signed)
Vitamins OK but no data to suggest any benefit;  OK to postpone surgery until patient feels that she needs it

## 2011-02-28 NOTE — Assessment & Plan Note (Signed)
[facility] OFFICE NOTE   Susan Collins, Susan Collins                        MRN:          [id number]  DATE:11/27/2006                            DOB:          01-Dec-1945    DATE OF VISIT:  November 27, 2006   HISTORY AND REASON FOR VISIT:  The patient is a 65 year old female seen  today for an annual exam.  She has a history of mild  hypercholesterolemia, diet controlled, colonic polyps.  She has had  complicated diverticular disease in 1996, status post surgery for  diverticular abscess with total hysterectomy at that time.  She has had  nasal and shoulder surgery as well as remote laminectomy.  She is a  gravida 4, para 4, abortus 0.  Complaints today include a greater than  one year history of tingling and diminished sensation in her lower  extremities.   REVIEW OF SYSTEMS:  Otherwise negative.  Her last colonoscopy was in  2001.   FAMILY HISTORY:  Unchanged.   PHYSICAL EXAMINATION:  GENERAL APPEARANCE:  Exam reveals a well-  developed female in no acute distress.  VITAL SIGNS:  Blood pressure is well controlled.  SKIN:  Negative.  HEENT:  Fundi, ears, nose and throat are clear.  Patient is edentulous.  NECK:  Reveals a soft right carotid bruit.  CHEST:  Clear.  BREASTS:  Negative.  CARDIOVASCULAR:  Normal heart sounds, no murmurs.  ABDOMEN:  Benign.  Lower midline scar.  PELVIC:  Exam reveals absent uterus.  No masses. Stool was Hemoccult  negative.  EXTREMITIES:  Reveal intact peripheral pulses.  NEUROLOGICAL:  Did reveal some diminished vibratory sensation and also  to monofilament testing on the left.   IMPRESSION:  1. Colonic polyps.  2. Menopausal syndrome.  3. Mild hypercholesterolemia.  4. Rule out peripheral neuropathy.   DISPOSITION:  Will set up for nerve conduction studies and a  colonoscopy.  Medical regimen unchanged.    Gordy Savers, MD  Electronically Signed   PFK/MedQ  DD:  11/27/2006  DT: 11/27/2006  Job #: [id number]

## 2011-02-28 NOTE — H&P (Signed)
[place]  Patient:    Susan Collins, Susan Collins                          MRN: [id number] Adm. Date:  01/07/00 Attending:  Danae Orleans. Venetia Maxon, M.D.                         History and Physical  CHIEF COMPLAINT:  Herniated lumbar disk and right S1 radiculopathy.  HISTORY OF PRESENT ILLNESS:  Ms. 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 Lacretia Nicks. Frederico Hamman, Montez Hageman. for a neurosurgical consultation regarding low back and right lower extremity pain.  I initially saw Ms. Susan Collins on November 11, 1999. She says she has had low back pain all her life.  She said that at times she has had severe flares of back pain, including one which occurred last Easter.  She lso notes pain into her right leg, which she describes as stinging, tingling, and burning.  She says it is quite aggravating now.  She notes tingling into her foot, including the bottom of her right foot in the second and third toes, as well as the calf.  She describes a tight feeling in her leg and foot.  She does not feel that she has any weakness.  She denies any bowel or bladder dysfunction, other than ome fecal urgency, secondary to multiple hemorrhoid surgeries.  She denies any left  lower extremity pain.  She has noted some improvement with Vioxx.  She says that at times she cries when the pain keeps her up.  She says that she wants to exercise, but has been unable to, because of her pain.  She says it is worse with the cold, and at times she has to prop herself up because it is hurting so badly in her leg. She underwent three epidural steroid injections.  She said these did not help her a lot.  She did not initially undergo any physical therapy.  REVIEW OF SYSTEMS:  A detailed review of systems was reviewed with the patient.  The pertinent positives are musculoskeletal:  She notes back pain, leg  pain, and arthritis, otherwise all other systems are negative.  PAST MEDICAL HISTORY:  Current medical condition, a history of asthma.  PAST SURGICAL HISTORY/HOSPITALIZATIONS: 1. In 1993, surgery on her right arm by Dr. Madelon Lips. 2. In 1996, a complete hysterectomy by Dr. Janeece Riggers. [place]. 3. In 1997, surgery for hemorrhoids by Dr. Zigmund Daniel, and again    later in 1997. 4. She has had three epidural steroid injections by Dr. Oneita Kras.  CURRENT MEDICATIONS: 1. Premarin once q.d. 2. Calcium once q.d.  ALLERGIES:  No known drug allergies.  Height is 5 feet 4 inches, weight 167 pounds.  FAMILY HISTORY:  Father died at age 11 of a heart attack, with a history of emphysema.  Mother died at age 38 of lung cancer.  There is a family history of  diabetes mellitus and heart disease.  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  She has been restricted from work because of her back and leg pain, and would like to be able to return to work.  She is a nonsmoker, nondrinker.  DIAGNOSTIC STUDIES:  Ms. Susan Collins presents with a lumbar MRI which was obtained on March 29, 1999, at [facility], which shows lumbar facet degenerative arthropathy with degenerative disk disease at L5-S1, with a small right paracentral disk herniation at L5-S1 with some mass affect on the right S1 nerve root.  PHYSICAL EXAMINATION:  GENERAL:  Ms. Susan Collins is a pleasant cooperative middle-aged woman, in mild discomfort.  VITAL SIGNS:  Pulse 80, blood pressure 140/80.  HEENT:  She is edentulous.  Normocephalic, atraumatic.  Pupils equal, round, reactive to light.  Extraocular movements intact.  Sclerae white.  Conjunctivae  pink.  Oropharynx benign.  Uvula midline.  NECK:  No masses, meningismus, deformities, tracheal deviation, jugular venous distention, or carotid bruits.  There is a normal cervical range of motion. Spurlings test is negative without reproducible radicular  pain on turning the patients head to either side.  Lhermittes sign is not present with axial compression.  LUNGS:  Normal respiratory effort with good intercostal function.  The lungs are clear to auscultation.  There are no rales, rhonchi, or wheezes.  CARDIOVASCULAR:  The heart has a regular rate and rhythm to auscultation.  No murmurs are appreciated.  EXTREMITIES:  No cyanosis, clubbing, or edema.  There are palpable pedal pulses.  ABDOMEN:  Soft, nontender.  No hepatosplenomegaly appreciated or masses.  There are active bowel sounds.  No guarding or rebound.  MUSCULOSKELETAL:  Ms. Susan Collins has tenderness at the lumbosacral junction to palpation with some right-sided sciatic notch discomfort.  She walks with an antalgic gait, favoring her right lower extremity, which she is able to heel and toe walk.  She is able to bend to touch her toes.  She has 20 degrees of extension and 15 degrees of lateral bending to each side.  NEUROLOGIC:  The patient is oriented to time, person, and place.  She has good recall of both recent and remote memory, with normal attention span and concentration.  The patient speaks with clear and fluent speech and exhibits normal language function and appropriate fund of knowledge.  Cranial nerve examination: Pupils equal, round, reactive to light.  Extraocular movements are full.  The visual fields are full to confrontational testing.  The facial sensation and facial motor are intact and symmetric.  Hearing is intact to finger rub.  Palate is upgoing.  Shoulder shrug is symmetric.  Tongue protrudes in the midline.  Motor  examination:  The motor strength is 5/5 in the bilateral deltoids, biceps, triceps, hand grips, wrist extensors, and interosseous.  In the lower extremities the motor strength is 5/5 in the hip flexion, extension, quadriceps, hamstrings, plantar flexion, dorsiflexion, and EHL.  Sensory examination:  Ms. Susan Collins notes tingling  in her right foot.  The S1 and to a slight degree the L5 distribution. Hypesthesia to pinprick in the right lower extremity.  The deep tendon reflexes are 2 in the  biceps bilaterally and triceps and brachial radialis, 2 at the knees, absent at  both ankles.  Toes are downgoing to plantar stimulation.  No Hoffmans sign. Straight leg raise is positive at 45 degrees on the right, negative on the left. Negative Patricks test.  Cerebellar examination:  Normal coordination in the upper and lower extremities.  Normal rapid alternating movements.  Romberg test is negative.  IMPRESSION:  Ms. Susan Collins is a 65 year old woman with low back and right lower extremity pain, with a degenerated disk at the L5-S1 level, with a right paracentral disk herniation.  She had epidural steroid injections which were not terribly helpful to her.  She had initially tried Vioxx and Neurontin.  Did not get a great deal of relief with this.  She went ahead with some physical therapy, but said that she had increased severity of her pain.  We tried additional medications, but she said her pain was much worse.  She subsequently returned to see me on December 20, 1999.  She was continuing to have right leg pain.  A repeat magnetic resonance imaging was obtained.  This demonstrates a free fragment disk herniation at L5-S1 on the right, with a fragment which is lodged behind the S1 body, directly beneath the S1 nerve root.  Of note, she has a persistent S1-2 disk space, so the effected level is the second to the lowest disk space.  RECOMMENDATIONS:  I have recommended that based on her severe pain and failure o improve with any conservative measures, that she undergo surgery, as she is continuing to limp, and says that her leg is numb and her foot is numb.  She continues to have significant pain.  She wished to discuss this with her husband, and then get back to me about a date when she wished to proceed.  I  reviewed the studies with the patient and went over her physical examination.  I reviewed the surgical models and discussed the typical hospital course and operative and postoperative course, and the potential risks and benefits of surgery.  The risks of surgery were discussed in detail, and these include, but are not limited to, the risks of anesthesia, blood loss, possibility of hemorrhage, infection, damage to nerves, damage to blood vessels, injury to the lumbar nerve roots, causing either temporary or permanent leg pain, numbness, or weakness.  There is potential for  spinal fluid leak from dural tear.  There is potential for post-laminectomy spondylolisthesis, recurrent disk herniation quoted as 10%, failure to relieve he pain, worsening of the pain, and need for further surgery. DD:  01/07/00 TD:  01/07/00 Job: [id number]

## 2011-02-28 NOTE — Assessment & Plan Note (Signed)
[facility] OFFICE NOTE   Susan Collins, Susan Collins                        MRN:          [id number]  DATE:01/15/2007                            DOB:          1946-03-20    The patient is seen today for followup of her peripheral neuropathy.  She was given a trial last visit of Lyrica.  She stopped after she felt  this was aggravating headaches.  She was evaluated with a sedimentation  rate that was borderline elevated at 31.  Serum protein electrophoresis,  ANA, B12 leve, and hemoglobin A1c were all normal.  A TSH was also  normal.  The patient states that she is very reluctant to consider  antidepressants, and does not wish to try amitriptyline or Cymbalta.  Today, she states her neuropathy is much better with only some mild foot  pain.   DISPOSITION:  Options were discussed.  She has elected to re-challenge  with the Lyrica.  If this is not effective or not well tolerated, we  will try Neurontin.  If further trials are needed, will refer to  neurology.     Gordy Savers, MD  Electronically Signed    PFK/MedQ  DD: 01/15/2007  DT: 01/15/2007  Job #: [id number]

## 2011-02-28 NOTE — Discharge Summary (Signed)
[place]  Patient:    Susan Collins, Susan Collins                        MRN: [id number] Adm. Date:  [id number] Disc. Date: [id number] Attending:  Josie Saunders                           Discharge Summary  REASON FOR ADMISSION. Lumbar disk herniation with lumbosacral degenerative disk disease and lumbosacral spondylosis.  HISTORY OF PRESENT ILLNESS: Susan Collins is a 65 year old woman with an S1 radiculopathy on the right, with a herniated lumbar disk at L5-S1 on the right.  She did not improve with conservative management including steroid injections and complained of significant persistent right lower extremity pain.  It was therefore elected to take her to surgery for lumbar microdiskectomy.  HOSPITAL COURSE: The patient was admitted on a same-day procedure basis and underwent right L5-S1 microdiskectomy with removal of free fragment on the right.  She postoperatively had full lower extremity strength and was doing well on January 08, 2000, and was discharged home in stable and satisfactory condition, having tolerated the operation and hospitalization well.  DISCHARGE MEDICATIONS:  1. Hydrocodone 5/500 1 or 2 q.4h to q.6h as-needed for pain.  2. Stool softener as-needed.  DISCHARGE ACTIVITY: No lifting, bending, twisting, or driving.  WOUND CARE: Okay to shower, but not to soak her incision.  FOLLOW-UP: To follow up in two weeks with Dr. Venetia Maxon.  Call today for an appointment. DD:  01/28/00 TD:  01/28/00 Job: [id number]

## 2011-02-28 NOTE — Telephone Encounter (Signed)
Voice mail is full  

## 2011-07-03 ENCOUNTER — Telehealth: Payer: Self-pay | Admitting: Internal Medicine

## 2011-07-03 MED ORDER — LORAZEPAM 0.5 MG PO TABS: 0.5000 mg | ORAL_TABLET | Freq: Two times a day (BID) | ORAL | Status: DC | PRN

## 2011-07-03 NOTE — Telephone Encounter (Signed)
Spoke with pt - aware of new rx called in

## 2011-07-03 NOTE — Telephone Encounter (Signed)
Pt called and said that they have had 4 deaths in their family in 6 wks. Pt is req to get a mild anti anxiety med for 1 week. Pt is very distraught. Pls call in to [facility] on [address]

## 2011-07-03 NOTE — Telephone Encounter (Signed)
Lorazepam 0.5 #50 one twice daily as needed

## 2011-07-03 NOTE — Telephone Encounter (Signed)
Please advise 

## 2011-07-08 ENCOUNTER — Other Ambulatory Visit: Payer: Self-pay | Admitting: Internal Medicine

## 2011-07-09 LAB — HM MAMMOGRAPHY: HM Mammogram: NEGATIVE

## 2011-07-10 ENCOUNTER — Encounter: Payer: Self-pay | Admitting: Internal Medicine

## 2011-08-15 ENCOUNTER — Encounter: Payer: Self-pay | Admitting: Internal Medicine

## 2011-08-22 ENCOUNTER — Encounter: Payer: Self-pay | Admitting: Internal Medicine

## 2011-08-22 ENCOUNTER — Ambulatory Visit (INDEPENDENT_AMBULATORY_CARE_PROVIDER_SITE_OTHER): Payer: 59 | Admitting: Internal Medicine

## 2011-08-22 DIAGNOSIS — J45909 Unspecified asthma, uncomplicated: Secondary | ICD-10-CM

## 2011-08-22 DIAGNOSIS — R0609 Other forms of dyspnea: Secondary | ICD-10-CM

## 2011-08-22 DIAGNOSIS — F411 Generalized anxiety disorder: Secondary | ICD-10-CM

## 2011-08-22 DIAGNOSIS — E7800 Pure hypercholesterolemia, unspecified: Secondary | ICD-10-CM

## 2011-08-22 DIAGNOSIS — R0989 Other specified symptoms and signs involving the circulatory and respiratory systems: Secondary | ICD-10-CM

## 2011-08-22 DIAGNOSIS — F419 Anxiety disorder, unspecified: Secondary | ICD-10-CM

## 2011-08-22 MED ORDER — METHYLPREDNISOLONE ACETATE 80 MG/ML IJ SUSP
80.0000 mg | Freq: Once | INTRAMUSCULAR | Status: AC
  Administered 2011-08-22: 80 mg via INTRAMUSCULAR

## 2011-08-22 MED ORDER — LORAZEPAM 0.5 MG PO TABS: 0.5000 mg | ORAL_TABLET | Freq: Two times a day (BID) | ORAL | Status: DC | PRN

## 2011-08-22 MED ORDER — MOMETASONE FURO-FORMOTEROL FUM 200-5 MCG/ACT IN AERO: INHALATION_SPRAY | RESPIRATORY_TRACT | Status: DC

## 2011-08-22 MED ORDER — ALBUTEROL SULFATE HFA 108 (90 BASE) MCG/ACT IN AERS: 2.0000 | INHALATION_SPRAY | Freq: Four times a day (QID) | RESPIRATORY_TRACT | Status: DC | PRN

## 2011-08-22 NOTE — Patient Instructions (Signed)
Return in 3 weeks for follow up.

## 2011-08-22 NOTE — Progress Notes (Signed)
  Subjective:    [name], female    DOB: [birth year], 65 y.o.   MRN: [id number]  HPI  65 year old patient who has remote history of asthma. 25 years ago she received immunotherapy on a regular basis for asthma. At the present time she is on albuterol when necessary only. She presents today with a chief complaint of worsening dyspnea on exertion since July. She describes a wheezing symptoms are relieved by her albuterol MDI. She denies any chest pain. She has been under considerable situational stress since July before family deaths over the past 3 months. She has been placed on anxiolytic medication with benefit    Review of Systems  Constitutional: Negative.   HENT: Negative for hearing loss, congestion, sore throat, rhinorrhea, dental problem, sinus pressure and tinnitus.   Eyes: Negative for pain, discharge and visual disturbance.  Respiratory: Positive for shortness of breath. Negative for cough.   Cardiovascular: Negative for chest pain, palpitations and leg swelling.  Gastrointestinal: Negative for nausea, vomiting, abdominal pain, diarrhea, constipation, blood in stool and abdominal distention.  Genitourinary: Negative for dysuria, urgency, frequency, hematuria, flank pain, vaginal bleeding, vaginal discharge, difficulty urinating, vaginal pain and pelvic pain.  Musculoskeletal: Negative for joint swelling, arthralgias and gait problem.  Skin: Negative for rash.  Neurological: Negative for dizziness, syncope, speech difficulty, weakness, numbness and headaches.  Hematological: Negative for adenopathy.  Psychiatric/Behavioral: Negative for behavioral problems, dysphoric mood and agitation. The patient is nervous/anxious.        Objective:   Physical Exam  Constitutional: She is oriented to person, place, and time. She appears well-developed and well-nourished. No distress.       Anxious. Blood pressure 124/70  HENT:  Head: Normocephalic.  Right Ear: External ear  normal.  Left Ear: External ear normal.  Mouth/Throat: Oropharynx is clear and moist.  Eyes: Conjunctivae and EOM are normal. Pupils are equal, round, and reactive to light.  Neck: Normal range of motion. Neck supple. No thyromegaly present.  Cardiovascular: Normal rate, regular rhythm, normal heart sounds and intact distal pulses.   Pulmonary/Chest: Effort normal. She has wheezes.       A few scattered wheezes noted anteriorly O2 saturation 97% Pulse rate 90  Abdominal: Soft. Bowel sounds are normal. She exhibits no mass. There is no tenderness.  Musculoskeletal: Normal range of motion.  Lymphadenopathy:    She has no cervical adenopathy.  Neurological: She is alert and oriented to person, place, and time.  Skin: Skin is warm and dry. No rash noted.  Psychiatric: She has a normal mood and affect. Her behavior is normal.          Assessment & Plan:   Dyspnea on exertion. It appears related to bronchospastic disease. We'll refill albuterol which has been quite helpful will also be placed on maintenance Dulera 1 puff twice daily. She will return when necessary if not improved and for routine followup in 3 weeks  Situational anxiety. We'll continue lorazepam

## 2011-09-10 ENCOUNTER — Other Ambulatory Visit: Payer: Self-pay | Admitting: Internal Medicine

## 2011-11-28 ENCOUNTER — Telehealth: Payer: Self-pay | Admitting: *Deleted

## 2011-11-28 MED ORDER — HYDROCODONE-HOMATROPINE 5-1.5 MG/5ML PO SYRP: 5.0000 mL | ORAL_SOLUTION | Freq: Three times a day (TID) | ORAL | Status: AC | PRN

## 2011-11-28 NOTE — Telephone Encounter (Signed)
Wrong number.

## 2011-11-28 NOTE — Telephone Encounter (Signed)
Hydromet 6 ounces 1 teaspoon  every 6 hours as needed for cough and congestion 

## 2011-11-28 NOTE — Telephone Encounter (Signed)
Pt is asking for Rx for runny nose and cough. No fever. Non Productive cough x several days.

## 2012-02-10 ENCOUNTER — Other Ambulatory Visit: Payer: Self-pay | Admitting: Internal Medicine

## 2012-02-10 MED ORDER — SIMVASTATIN 40 MG PO TABS: 40.0000 mg | ORAL_TABLET | Freq: Every day | ORAL | Status: DC

## 2012-02-10 NOTE — Telephone Encounter (Signed)
Pt requesting refill on simvastatin (ZOCOR) 40 MG tablet  [facility] - [place] ([place] [place] - [address]  Pt requesting to be contacted when rx is called in

## 2012-07-12 ENCOUNTER — Encounter: Payer: Self-pay | Admitting: Internal Medicine

## 2012-07-12 ENCOUNTER — Ambulatory Visit (INDEPENDENT_AMBULATORY_CARE_PROVIDER_SITE_OTHER): Payer: Medicare Other | Admitting: Internal Medicine

## 2012-07-12 VITALS — BP 118/80 | HR 74 | Temp 98.2°F | Resp 18 | Ht 63.0 in | Wt 157.0 lb

## 2012-07-12 DIAGNOSIS — Z0000 Encounter for general adult medical examination without abnormal findings: Secondary | ICD-10-CM

## 2012-07-12 DIAGNOSIS — E7800 Pure hypercholesterolemia, unspecified: Secondary | ICD-10-CM

## 2012-07-12 DIAGNOSIS — J45909 Unspecified asthma, uncomplicated: Secondary | ICD-10-CM

## 2012-07-12 DIAGNOSIS — Z86010 Personal history of colonic polyps: Secondary | ICD-10-CM

## 2012-07-12 DIAGNOSIS — H9319 Tinnitus, unspecified ear: Secondary | ICD-10-CM

## 2012-07-12 LAB — CBC WITH DIFFERENTIAL/PLATELET
Basophils Absolute: 0.1 10*3/uL (ref 0.0–0.1)
Basophils Relative: 1 % (ref 0.0–3.0)
Eosinophils Absolute: 0.2 10*3/uL (ref 0.0–0.7)
Lymphocytes Relative: 34.8 % (ref 12.0–46.0)
MCHC: 33.1 g/dL (ref 30.0–36.0)
MCV: 91.2 fl (ref 78.0–100.0)
Monocytes Absolute: 0.7 10*3/uL (ref 0.1–1.0)
Neutrophils Relative %: 50.5 % (ref 43.0–77.0)
Platelets: 215 10*3/uL (ref 150.0–400.0)
RBC: 4.28 Mil/uL (ref 3.87–5.11)
RDW: 13.8 % (ref 11.5–14.6)

## 2012-07-12 LAB — COMPREHENSIVE METABOLIC PANEL
ALT: 17 U/L (ref 0–35)
AST: 18 U/L (ref 0–37)
Albumin: 4.1 g/dL (ref 3.5–5.2)
Alkaline Phosphatase: 62 U/L (ref 39–117)
BUN: 16 mg/dL (ref 6–23)
Calcium: 10.6 mg/dL — ABNORMAL HIGH (ref 8.4–10.5)
Chloride: 104 mEq/L (ref 96–112)
Potassium: 4.7 mEq/L (ref 3.5–5.1)
Sodium: 139 mEq/L (ref 135–145)
Total Protein: 7.5 g/dL (ref 6.0–8.3)

## 2012-07-12 LAB — LDL CHOLESTEROL, DIRECT: Direct LDL: 117.5 mg/dL

## 2012-07-12 LAB — LIPID PANEL
HDL: 53.9 mg/dL (ref >39.00)
Total CHOL/HDL Ratio: 4
VLDL: 31.4 mg/dL (ref 0.0–40.0)

## 2012-07-12 LAB — TSH: TSH: 1.77 u[IU]/mL (ref 0.35–5.50)

## 2012-07-12 MED ORDER — LORAZEPAM 0.5 MG PO TABS: 0.5000 mg | ORAL_TABLET | Freq: Two times a day (BID) | ORAL | Status: AC | PRN

## 2012-07-12 MED ORDER — TRAMADOL HCL 50 MG PO TABS: 50.0000 mg | ORAL_TABLET | Freq: Four times a day (QID) | ORAL | Status: DC | PRN

## 2012-07-12 MED ORDER — SIMVASTATIN 40 MG PO TABS: 40.0000 mg | ORAL_TABLET | Freq: Every day | ORAL | Status: DC

## 2012-07-12 MED ORDER — ALBUTEROL SULFATE HFA 108 (90 BASE) MCG/ACT IN AERS: 2.0000 | INHALATION_SPRAY | Freq: Four times a day (QID) | RESPIRATORY_TRACT | Status: DC | PRN

## 2012-07-12 NOTE — Patient Instructions (Signed)
Limit your sodium (Salt) intake    It is important that you exercise regularly, at least 20 minutes 3 to 4 times per week.  If you develop chest pain or shortness of breath seek  medical attention.  Return in one year for follow-up  Take a calcium supplement, plus 800-1200 units of vitamin D 
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
## 2012-07-26 ENCOUNTER — Encounter: Payer: Self-pay | Admitting: Internal Medicine

## 2012-10-20 ENCOUNTER — Other Ambulatory Visit: Payer: Self-pay | Admitting: Internal Medicine

## 2012-11-02 ENCOUNTER — Ambulatory Visit: Payer: Self-pay | Admitting: Internal Medicine

## 2012-11-02 ENCOUNTER — Telehealth: Payer: Self-pay | Admitting: Internal Medicine

## 2012-11-02 NOTE — Telephone Encounter (Signed)
Patient Information:  Caller Name: Zayden  Phone: [phone]  Patient: Susan Collins, Susan Collins  Gender: Female  DOB: 1945-12-07  Age: 67 Years  PCP: Eleonore Chiquito [facility])  Office Follow Up:  Does the office need to follow up with this patient?: No  Instructions For The Office: N/A  RN Note:  pt is also reporting some shortness of breath  Symptoms  Reason For Call & Symptoms: chest cold symptoms, head congestion, ear stopped up  Reviewed Health History In EMR: Yes  Reviewed Medications In EMR: Yes  Reviewed Allergies In EMR: Yes  Reviewed Surgeries / Procedures: Yes  Date of Onset of Symptoms: 10/31/2012  Treatments Tried: Tylenol Cold and Flu, Robitussin  Treatments Tried Worked: No  Guideline(s) Used:  Colds  Disposition Per Guideline:   See Today or Tomorrow in Office  Reason For Disposition Reached:   Patient wants to be seen  Advice Given:  Call Back If:  Difficulty breathing occurs  You become worse  Appointment Scheduled:  11/02/2012 14:30:00 Appointment Scheduled Provider:  Eleonore Chiquito (Family Practice)

## 2012-11-09 ENCOUNTER — Telehealth: Payer: Self-pay | Admitting: Internal Medicine

## 2012-11-09 NOTE — Telephone Encounter (Signed)
Patient Information:  Caller Name: Demetra  Phone: [phone]  Patient: Susan Collins, Susan Collins  Gender: Female  DOB: [birth year]  Age: 67 Years  PCP: Eleonore Chiquito [facility])  Office Follow Up:  Does the office need to follow up with this patient?: No  Instructions For The Office: N/A   Symptoms  Reason For Call & Symptoms: Started with runny nose then cough, congestion longer than a week.  Dry cough worse in the mornings and during the night  Reviewed Health History In EMR: Yes  Reviewed Medications In EMR: Yes  Reviewed Allergies In EMR: Yes  Reviewed Surgeries / Procedures: Yes  Date of Onset of Symptoms: 10/30/2012  Guideline(s) Used:  Cough  Disposition Per Guideline:   See Within 3 Days in Office  Reason For Disposition Reached:   Cough has been present for > 10 days  Advice Given:  Reassurance  You can get a dry hacking cough after a chest cold. Sometimes this type of cough can last 1-3 weeks, and be worse at night.  Cough Medicines:  OTC Cough Syrups: The most common cough suppressant in OTC cough medications is dextromethorphan. Often the letters "DM" appear in the name.  OTC Cough Drops: Cough drops can help a lot, especially for mild coughs. They reduce coughing by soothing your irritated throat and removing that tickle sensation in the back of the throat. Cough drops also have the advantage of portability - you can carry them with you.  Coughing Spasms:  Drink warm fluids. Inhale warm mist (Reason: both relax the airway and loosen up the phlegm).  Suck on cough drops or hard candy to coat the irritated throat.  Prevent Dehydration:  Drink adequate liquids.  Expected Course:   Nasal discharge 7-14 days  Cough up to 2-3 weeks.  Call Back If:  Difficulty breathing occurs  Cough lasts more than 3 weeks  You become worse  Just calling to see if something needs to be called in

## 2013-01-18 ENCOUNTER — Other Ambulatory Visit: Payer: Self-pay | Admitting: Internal Medicine

## 2013-04-12 ENCOUNTER — Telehealth: Payer: Self-pay | Admitting: Internal Medicine

## 2013-04-12 NOTE — Telephone Encounter (Signed)
Pt would like to increase her tramadol 50 mg to twice a day as needed for pain  Pt only request #150 for 90 day supply call into [facility] [address]

## 2013-04-12 NOTE — Telephone Encounter (Signed)
See message, Molli Knock to increase Tramadol?

## 2013-04-12 NOTE — Telephone Encounter (Signed)
ok 

## 2013-04-13 MED ORDER — TRAMADOL HCL 50 MG PO TABS: 50.0000 mg | ORAL_TABLET | Freq: Two times a day (BID) | ORAL | Status: DC | PRN

## 2013-04-13 NOTE — Telephone Encounter (Signed)
Tried to contact pt number is disconnected, will try again later. Rx sent to pharmacy.

## 2013-07-20 ENCOUNTER — Ambulatory Visit (INDEPENDENT_AMBULATORY_CARE_PROVIDER_SITE_OTHER): Payer: No Typology Code available for payment source | Admitting: Internal Medicine

## 2013-07-20 ENCOUNTER — Encounter: Payer: Self-pay | Admitting: Internal Medicine

## 2013-07-20 VITALS — BP 130/60 | HR 69 | Temp 97.9°F | Resp 20 | Ht 62.25 in | Wt 154.0 lb

## 2013-07-20 DIAGNOSIS — Z86010 Personal history of colonic polyps: Secondary | ICD-10-CM

## 2013-07-20 DIAGNOSIS — Z23 Encounter for immunization: Secondary | ICD-10-CM

## 2013-07-20 DIAGNOSIS — Z0000 Encounter for general adult medical examination without abnormal findings: Secondary | ICD-10-CM

## 2013-07-20 DIAGNOSIS — E7800 Pure hypercholesterolemia, unspecified: Secondary | ICD-10-CM

## 2013-07-20 DIAGNOSIS — J45909 Unspecified asthma, uncomplicated: Secondary | ICD-10-CM

## 2013-07-20 LAB — COMPREHENSIVE METABOLIC PANEL
ALT: 18 U/L (ref 0–35)
AST: 19 U/L (ref 0–37)
Chloride: 103 mEq/L (ref 96–112)
Creatinine, Ser: 0.8 mg/dL (ref 0.4–1.2)
Sodium: 142 mEq/L (ref 135–145)
Total Bilirubin: 1.1 mg/dL (ref 0.3–1.2)
Total Protein: 7.6 g/dL (ref 6.0–8.3)

## 2013-07-20 LAB — CBC WITH DIFFERENTIAL/PLATELET
Basophils Absolute: 0.1 10*3/uL (ref 0.0–0.1)
Eosinophils Absolute: 0.3 10*3/uL (ref 0.0–0.7)
HCT: 38 % (ref 36.0–46.0)
Hemoglobin: 12.7 g/dL (ref 12.0–15.0)
Lymphs Abs: 2.6 10*3/uL (ref 0.7–4.0)
MCHC: 33.4 g/dL (ref 30.0–36.0)
Monocytes Absolute: 0.7 10*3/uL (ref 0.1–1.0)
Neutro Abs: 4.9 10*3/uL (ref 1.4–7.7)
Platelets: 224 10*3/uL (ref 150.0–400.0)
RDW: 13.5 % (ref 11.5–14.6)

## 2013-07-20 LAB — LIPID PANEL
Cholesterol: 191 mg/dL (ref 0–200)
Total CHOL/HDL Ratio: 3
Triglycerides: 205 mg/dL — ABNORMAL HIGH (ref 0.0–149.0)
VLDL: 41 mg/dL — ABNORMAL HIGH (ref 0.0–40.0)

## 2013-07-20 LAB — LDL CHOLESTEROL, DIRECT: Direct LDL: 106.5 mg/dL

## 2013-07-20 MED ORDER — ALBUTEROL SULFATE HFA 108 (90 BASE) MCG/ACT IN AERS: 2.0000 | INHALATION_SPRAY | Freq: Four times a day (QID) | RESPIRATORY_TRACT | Status: DC | PRN

## 2013-07-20 MED ORDER — SIMVASTATIN 40 MG PO TABS: ORAL_TABLET | ORAL | Status: DC

## 2013-07-20 MED ORDER — TRAMADOL HCL 50 MG PO TABS: 50.0000 mg | ORAL_TABLET | Freq: Two times a day (BID) | ORAL | Status: DC | PRN

## 2013-07-20 NOTE — Progress Notes (Signed)
[name], female   DOB: 04/24/1946, 67 y.o.   MRN: [id number]  Subjective:    [name], female    DOB: 02-26-1946, 67 y.o.   MRN: [id number]  HPI  CC: cpx - doing well.   History of Present Illness:   67  year-old patient who is seen today for a wellness exam. She has a history of complex diverticular disease and is status post  Colonoscopy in 2011. She has a history menopausal syndrome and also a history of idiopathic peripheral autonomic neuropathy. She has chronic tinnitus.  She has a history of mild asthma as well as dyslipidemia.   Here for Medicare AWV:  1. Risk factors based on Past M, S, F history: risk factors include hypercholesterolemia. She denies any cardiopulmonary complaints  2. Physical Activities: remains active, walks daily,does outdoor gardening  3. Depression/mood: no history of depression or mood disorder  4. Hearing: mild impairment. She states this is being fitted for hearing aids. She does have chronic tinnitus  5. ADL's: independent in all aspects of daily living  6. Fall Risk: low  7. Home Safety: no problems identified  8. Height, weight, &visual acuity:height and weight are stable with glasses, and no change in her visual acuity  9. Counseling: exercise discussed and encouraged  10. Labs ordered based on risk factors: laboratory profile, including lipid panel will be reviewed  11. Referral Coordination- mammogram, encouraged  12. Care Plan- will continue calcium and vitamin D supplementation, more regular exercise regimen encouraged  13. Cognitive Assessment- alert and oriented, with normal affect. No difficulty handling all executive functions. No memory deficit   Preventive Screening-Counseling & Management  Alcohol-Tobacco  Smoking Status: never   Allergies:  1) ! Sulfa   Past History:  Past Medical History:   Colonic polyps, hx of  Mild Hypercholesterolemia  complex diverticular disease  peripheral neuropathy   Tinnitus  remote history of asthma   Past Surgical History:  Hysterectomy 96  Lumbar laminectomy 01  Nasal sx  Right shoulder sx  Colonoscopy-09/07/2004, 2011  1996 history of diverticular abscess   Family History:   Fam hx MI  Family History Diabetes 1st degree relative  Family History Lung cancer  Fam hx COPD  father died of an MI at age 72. History of COPD  mother died of lung cancer at 65  One brother, COPD, bladder Ca (deceased)  Three sisters, one died at 49 weeks of age  one sister died at 97 complications of diabetes   Social History:   Married  discontinue smoking 14 years ago Son died age 74 massive MI  Past Medical History  Diagnosis Date  . CHEST PAIN 11/20/2008  . COLONIC POLYPS, HX OF 05/24/2007  . DYSPNEA ON EXERTION 12/19/2008  . HYPERCHOLESTEROLEMIA 09/18/2008  . 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 POSTMENOPAUSAL SYNDROME 04/12/2008  . 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 Diverticular disease   . Peripheral neuropathy     History   Social History  . Marital Status: Married    Spouse Name: N/A    Number of Children: N/A  . Years of Education: N/A   Occupational History  . Not on file.   Social History Main Topics  . Smoking status: Former Smoker    Quit date: 10/13/1996  . Smokeless tobacco: Never Used  . Alcohol Use: No  . Drug Use: No  . Sexual Activity: Not on file   Other Topics Concern  . Not on  file   Social History Narrative  . No narrative on file    Past Surgical History  Procedure Laterality Date  . Abdominal hysterectomy    . Lumbar laminectomy    . Nasal sinus surgery    . Shoulder surgery      Family History  Problem Relation Age of Onset  . Cancer Mother     died of lung Ca  . COPD Father   . Heart disease Father     died of MI  . Cancer Brother     bladder Ca  . COPD Brother     Allergies  Allergen Reactions  . Sulfonamide Derivatives     REACTION: headache    Current Outpatient  Prescriptions on File Prior to Visit  Medication Sig Dispense Refill  . albuterol (PROVENTIL HFA;VENTOLIN HFA) 108 (90 BASE) MCG/ACT inhaler Inhale 2 puffs into the lungs every 6 (six) hours as needed.  1 Inhaler  6  . Calcium Carbonate-Vitamin D (CALTRATE 600+D) 600-400 MG-UNIT per tablet Take 2 tablets by mouth daily.        . simvastatin (ZOCOR) 40 MG tablet TAKE ONE TABLET BY MOUTH EVERY DAY  90 tablet  1  . traMADol (ULTRAM) 50 MG tablet Take 1 tablet (50 mg total) by mouth 2 (two) times daily as needed for pain.  150 tablet  1   No current facility-administered medications on file prior to visit.    BP 130/60  Pulse 69  Temp(Src) 97.9 F (36.6 C) (Oral)  Resp 20  Ht 5' 2.25" (1.581 m)  Wt 154 lb (69.854 kg)  BMI 27.95 kg/m2  SpO2 95%     Review of Systems  Constitutional: Negative for fever, appetite change, fatigue and unexpected weight change.  HENT: Positive for tinnitus (chronic right-sided tinnitus). Negative for congestion, dental problem, ear pain, hearing loss ( uses a hearing in frequently), mouth sores, nosebleeds, sinus pressure, sore throat, trouble swallowing and voice change.   Eyes: Negative for photophobia, pain, redness and visual disturbance.  Respiratory: Negative for cough, chest tightness and shortness of breath.   Cardiovascular: Negative for chest pain, palpitations and leg swelling.  Gastrointestinal: Negative for nausea, vomiting, abdominal pain, diarrhea, constipation, blood in stool, abdominal distention and rectal pain.  Genitourinary: Negative for dysuria, urgency, frequency, hematuria, flank pain, vaginal bleeding, vaginal discharge, difficulty urinating, genital sores, vaginal pain, menstrual problem and pelvic pain.  Musculoskeletal: Negative for arthralgias, back pain and neck stiffness.  Skin: Negative for rash.  Neurological: Negative for dizziness, syncope, speech difficulty, weakness, light-headedness, numbness and headaches.   Hematological: Negative for adenopathy. Does not bruise/bleed easily.  Psychiatric/Behavioral: Negative for suicidal ideas, behavioral problems, self-injury, dysphoric mood and agitation. The patient is not nervous/anxious.        Objective:   Physical Exam  Constitutional: She is oriented to person, place, and time. She appears well-developed and well-nourished.  HENT:  Head: Normocephalic and atraumatic.  Right Ear: External ear normal.  Left Ear: External ear normal.  Mouth/Throat: Oropharynx is clear and moist.  Edentulous  Eyes: Conjunctivae and EOM are normal.  Neck: Normal range of motion. Neck supple. No JVD present. No thyromegaly present.  Cardiovascular: Normal rate, regular rhythm, normal heart sounds and intact distal pulses.   No murmur heard. Pulmonary/Chest: Effort normal and breath sounds normal. She has no wheezes. She has no rales.  Abdominal: Soft. Bowel sounds are normal. She exhibits no distension and no mass. There is no tenderness. There is no rebound and  no guarding.  Musculoskeletal: Normal range of motion. She exhibits no edema and no tenderness.  Neurological: She is alert and oriented to person, place, and time. She has normal reflexes. No cranial nerve deficit. She exhibits normal muscle tone. Coordination normal.  Skin: Skin is warm and dry. No rash noted.  Psychiatric: She has a normal mood and affect. Her behavior is normal.          Assessment & Plan:   Preventive health exam. Flu and Pneumovax dispensed Hypercholesterolemia. We'll check a lipid profile  History of colonic polyps. Followup colonoscopy in 2 years Asthma mild and stable. Continue when necessary albuterol Recheck 1 year or as needed Mammogram next month as scheduled

## 2013-07-20 NOTE — Patient Instructions (Signed)
Limit your sodium (Salt) intake    It is important that you exercise regularly, at least 20 minutes 3 to 4 times per week.  If you develop chest pain or shortness of breath seek  medical attention.  Return in one year for follow-up   

## 2013-08-01 LAB — HM MAMMOGRAPHY: HM Mammogram: NEGATIVE

## 2013-08-15 ENCOUNTER — Encounter: Payer: Self-pay | Admitting: Internal Medicine

## 2013-10-14 ENCOUNTER — Telehealth: Payer: Self-pay | Admitting: Internal Medicine

## 2013-10-14 MED ORDER — TRAMADOL HCL 50 MG PO TABS: 50.0000 mg | ORAL_TABLET | Freq: Two times a day (BID) | ORAL | Status: DC | PRN

## 2013-10-14 NOTE — Telephone Encounter (Signed)
Pt requesting refill of traMADol (ULTRAM) 50 MG tablet, she states she has previously been getting #150 but she not needs to get # 120.

## 2013-10-14 NOTE — Telephone Encounter (Signed)
Tried to contact pt, unable to leave voicemail has not been set up yet. Rx called into pharmacy.

## 2013-10-15 NOTE — Telephone Encounter (Signed)
Tried to contact pt again unable to leave message.

## 2014-01-19 ENCOUNTER — Other Ambulatory Visit: Payer: Self-pay | Admitting: Internal Medicine

## 2014-01-19 ENCOUNTER — Telehealth: Payer: Self-pay | Admitting: Internal Medicine

## 2014-01-19 NOTE — Telephone Encounter (Signed)
Spoke to [facility] pharmacist and called Rx in again for Tramadol.

## 2014-01-19 NOTE — Telephone Encounter (Signed)
Pt is needing new rxtraMADol (ULTRAM) 50 MG tablet, send to [facility] [address]

## 2014-01-19 NOTE — Telephone Encounter (Signed)
Pt states the pharm has never received the refill. Can you resend? Spoke w/ pharm and confirmed they do not have.

## 2014-01-19 NOTE — Telephone Encounter (Signed)
Spoke to pt told her she should have refills left on Tramadol, 6 month supply was called in on 10/2013 just need to contact pharmacy. Pt verbalized understanding.

## 2014-01-30 ENCOUNTER — Telehealth: Payer: Self-pay | Admitting: Internal Medicine

## 2014-01-30 MED ORDER — ALBUTEROL SULFATE HFA 108 (90 BASE) MCG/ACT IN AERS: 2.0000 | INHALATION_SPRAY | Freq: Four times a day (QID) | RESPIRATORY_TRACT | Status: DC | PRN

## 2014-01-30 NOTE — Telephone Encounter (Signed)
Spoke to pt told her Rx refill for inhaler sent to pharmacy and need to call back and schedule follow up appointment with Dr.K this week. Pt verbalized understanding. Told pt any increase in SOB or symptoms worsen please go to ED or Urgent care. Pt verbalized understanding.

## 2014-01-30 NOTE — Telephone Encounter (Signed)
Please refill Proventil and schedule return office visit

## 2014-01-30 NOTE — Telephone Encounter (Signed)
Patient Information:  Caller Name: Michille  Phone: [phone]  Patient: Susan Collins, Susan Collins  Gender: Female  DOB: 1946-06-15  Age: 68 Years  PCP: Bluford Kaufmann (Family Practice > 36yrs old)  Office Follow Up:  Does the office need to follow up with this patient?: Yes  Instructions For The Office: Pls see RN note  RN Note:  Asthma Attack w/ SOB and Wheeze, onset 2 weeks.  Pt is out of Proventil.  Pt has audible wheeze at triage, denies Chest Pain, speaking full sentences. All emergent sxs ruled out per Asthma Attack protocol, see today, d/t asthma med is needed more frequently q4 hrs to keep comfortable.  Same day appt and next day appt offered, Pt only wants to see Dr Burnice Logan, afternoon slots on 4-21 were too late for Pt.  Pt is completely out of Proventil.  Consulted w/ Office, ask to send note thru [facility] so RN can dicuss w/ MD and f/u w/ Pt.  Symptoms  Reason For Call & Symptoms: Asthma Attack w/ SOB and Wheeze  Reviewed Health History In EMR: Yes  Reviewed Medications In EMR: Yes  Reviewed Allergies In EMR: Yes  Reviewed Surgeries / Procedures: Yes  Date of Onset of Symptoms: 01/16/2014  Treatments Tried: Proventil  Treatments Tried Worked: No  Guideline(s) Used:  Asthma Attack  Disposition Per Guideline:   See Today in Office  Reason For Disposition Reached:   Asthma medicine (nebulizer or inhaler) is needed more frequently than every 4 hours to keep you comfortable  Advice Given:  N/A  Patient Refused Recommendation:  Patient Requests Prescription  Note sent to RN to discuss w/ MD.

## 2014-01-30 NOTE — Telephone Encounter (Signed)
Dr. Raliegh Ip, please see message and advise where to put pt? Rx refill sent to pharmacy for inhaler.

## 2014-02-01 ENCOUNTER — Encounter: Payer: Self-pay | Admitting: Internal Medicine

## 2014-02-01 ENCOUNTER — Ambulatory Visit (INDEPENDENT_AMBULATORY_CARE_PROVIDER_SITE_OTHER): Payer: Medicare HMO | Admitting: Internal Medicine

## 2014-02-01 VITALS — BP 140/70 | HR 87 | Temp 97.6°F | Resp 20 | Ht 62.25 in | Wt 151.0 lb

## 2014-02-01 DIAGNOSIS — J45909 Unspecified asthma, uncomplicated: Secondary | ICD-10-CM

## 2014-02-01 DIAGNOSIS — R0989 Other specified symptoms and signs involving the circulatory and respiratory systems: Secondary | ICD-10-CM

## 2014-02-01 DIAGNOSIS — E7800 Pure hypercholesterolemia, unspecified: Secondary | ICD-10-CM

## 2014-02-01 DIAGNOSIS — R0609 Other forms of dyspnea: Secondary | ICD-10-CM

## 2014-02-01 MED ORDER — METHYLPREDNISOLONE ACETATE 80 MG/ML IJ SUSP
80.0000 mg | Freq: Once | INTRAMUSCULAR | Status: AC
  Administered 2014-02-01: 40 mg via INTRAMUSCULAR

## 2014-02-01 MED ORDER — ALBUTEROL SULFATE (2.5 MG/3ML) 0.083% IN NEBU
2.5000 mg | INHALATION_SOLUTION | Freq: Once | RESPIRATORY_TRACT | Status: AC
  Administered 2014-02-01: 2.5 mg via RESPIRATORY_TRACT

## 2014-02-01 MED ORDER — PREDNISONE 10 MG PO TABS: ORAL_TABLET | ORAL | Status: DC

## 2014-02-01 NOTE — Progress Notes (Signed)
Pre-visit discussion using our clinic review tool. No additional management support is needed unless otherwise documented below in the visit note.  

## 2014-02-01 NOTE — Progress Notes (Signed)
Subjective:    [name], female    DOB: 08/04/1946, 68 y.o.   MRN: [id number]  HPI  68 year old patient who has a history of mild asthma.  For the past several days.  She has had increasing cough, wheezing, and shortness of breath.  Symptoms are relieved by inhalational albuterol.  No fever or productive cough.  Past Medical History  Diagnosis Date  . CHEST PAIN 11/20/2008  . COLONIC POLYPS, HX OF 05/24/2007  . DYSPNEA ON EXERTION 12/19/2008  . HYPERCHOLESTEROLEMIA 09/18/2008  . 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 POSTMENOPAUSAL SYNDROME 04/12/2008  . 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 Diverticular disease   . Peripheral neuropathy     History   Social History  . Marital Status: Married    Spouse Name: N/A    Number of Children: N/A  . Years of Education: N/A   Occupational History  . Not on file.   Social History Main Topics  . Smoking status: Former Smoker    Quit date: 10/13/1996  . Smokeless tobacco: Never Used  . Alcohol Use: No  . Drug Use: No  . Sexual Activity: Not on file   Other Topics Concern  . Not on file   Social History Narrative  . No narrative on file    Past Surgical History  Procedure Laterality Date  . Abdominal hysterectomy    . Lumbar laminectomy    . Nasal sinus surgery    . Shoulder surgery      Family History  Problem Relation Age of Onset  . Cancer Mother     died of lung Ca  . COPD Father   . Heart disease Father     died of MI  . Cancer Brother     bladder Ca  . COPD Brother     Allergies  Allergen Reactions  . Sulfonamide Derivatives     REACTION: headache    Current Outpatient Prescriptions on File Prior to Visit  Medication Sig Dispense Refill  . albuterol (PROVENTIL HFA;VENTOLIN HFA) 108 (90 BASE) MCG/ACT inhaler Inhale 2 puffs into the lungs every 6 (six) hours as needed.  1 Inhaler  6  . simvastatin (ZOCOR) 40 MG tablet TAKE ONE TABLET BY MOUTH EVERY DAY  90 tablet  3  . traMADol  (ULTRAM) 50 MG tablet TAKE ONE TABLET BY MOUTH TWICE DAILY AS NEEDED FOR PAIN  150 tablet  3   No current facility-administered medications on file prior to visit.    BP 140/70  Pulse 87  Temp(Src) 97.6 F (36.4 C) (Oral)  Resp 20  Ht 5' 2.25" (1.581 m)  Wt 151 lb (68.493 kg)  BMI 27.40 kg/m2  SpO2 95%   It    Review of Systems  Constitutional: Negative.   HENT: Negative for congestion, dental problem, hearing loss, rhinorrhea, sinus pressure, sore throat and tinnitus.   Eyes: Negative for pain, discharge and visual disturbance.  Respiratory: Positive for cough, shortness of breath and wheezing.   Cardiovascular: Negative for chest pain, palpitations and leg swelling.  Gastrointestinal: Negative for nausea, vomiting, abdominal pain, diarrhea, constipation, blood in stool and abdominal distention.  Genitourinary: Negative for dysuria, urgency, frequency, hematuria, flank pain, vaginal bleeding, vaginal discharge, difficulty urinating, vaginal pain and pelvic pain.  Musculoskeletal: Negative for arthralgias, gait problem and joint swelling.  Skin: Negative for rash.  Neurological: Negative for dizziness, syncope, speech difficulty, weakness, numbness and headaches.  Hematological: Negative for adenopathy.  Psychiatric/Behavioral: Negative for behavioral  problems, dysphoric mood and agitation. The patient is not nervous/anxious.        Objective:   Physical Exam  Constitutional: She is oriented to person, place, and time. She appears well-developed and well-nourished.  HENT:  Head: Normocephalic.  Right Ear: External ear normal.  Left Ear: External ear normal.  Mouth/Throat: Oropharynx is clear and moist.  Eyes: Conjunctivae and EOM are normal. Pupils are equal, round, and reactive to light.  Neck: Normal range of motion. Neck supple. No thyromegaly present.  Cardiovascular: Normal rate, regular rhythm, normal heart sounds and intact distal pulses.   No resting tachypnea  or increased work of breathing Mild expiratory wheezing No tachycardia 2 saturation 95%  Pulmonary/Chest: Effort normal. No respiratory distress. She has wheezes.  Abdominal: Soft. Bowel sounds are normal. She exhibits no mass. There is no tenderness.  Musculoskeletal: Normal range of motion.  Lymphadenopathy:    She has no cervical adenopathy.  Neurological: She is alert and oriented to person, place, and time.  Skin: Skin is warm and dry. No rash noted.  Psychiatric: She has a normal mood and affect. Her behavior is normal.          Assessment & Plan:   Exacerbation of asthma.  Will show the nebulizer, albuterol treated with nebulized albuterol and 40 of Depo-Medrol.  We'll place on maintenance bronchodilators through the spring and a prednisone dose pack

## 2014-02-01 NOTE — Patient Instructions (Addendum)
Use albuterol every 6 hours as needed for wheezing or shortness or breath    Take over-the-counter expectorants and cough medications such as  Mucinex DM.  Call if there is no improvement in 5 to 7 days or if he developed worsening cough, fever, or new symptoms, such as shortness of breath or chest pain.    SYMBICORT 2 puffs twice daily

## 2014-04-03 ENCOUNTER — Telehealth: Payer: Self-pay | Admitting: Internal Medicine

## 2014-04-03 MED ORDER — HYDROCODONE-HOMATROPINE 5-1.5 MG/5ML PO SYRP: 5.0000 mL | ORAL_SOLUTION | Freq: Four times a day (QID) | ORAL | Status: DC | PRN

## 2014-04-03 NOTE — Telephone Encounter (Signed)
Patient Information:  Caller Name: Junice  Phone: [phone]  Patient: Susan, Collins  Gender: Female  DOB: 06/19/1946  Age: 68 Years  PCP: Bluford Kaufmann (Family Practice > 20yrs old)  Office Follow Up:  Does the office need to follow up with this patient?: Yes  Instructions For The Office: Please review and Curt Bears will discuss with Dr Burnice Logan since patient refused to see anyone but him. Can reach patient back at  [phone].  RN Note:  Instructed to Use her Ventolin Inhaler, rest quietly until call from office.  Symptoms  Reason For Call & Symptoms: cough, congestion, shortness of breath and wheezing started Thursday 6/18.  Feels her asthma is "acting up" again.  Can hear wheeze as she is speaking but she is able to speak in phrases rather than words.  Says she has used her Symbicort 4-5 times today (thinks she is supposed to use it BID) and her Ventolin Inhaler once. Breathing not so severe if sitting still.   Drinks a lot of ice water.  Reviewed Health History In EMR: Yes  Reviewed Medications In EMR: Yes  Reviewed Allergies In EMR: Yes  Reviewed Surgeries / Procedures: Yes  Date of Onset of Symptoms: 03/30/2014  Treatments Tried: Used inhalers - helped some  Treatments Tried Worked: No  Guideline(s) Used:  Asthma Attack  Disposition Per Guideline:   Go to Office Now  Reason For Disposition Reached:   Moderate asthma attack (e.g., SOB at rest, speaks in phrases, audible wheezes) and not resolved after 2 nebulizer or inhaler treatments   Advice Given:  N/A  Patient Will Follow Care Advice:  YES

## 2014-04-03 NOTE — Telephone Encounter (Signed)
Please see message and advise 

## 2014-04-03 NOTE — Telephone Encounter (Signed)
Spoke with Butch Penny, Dr. Marthann Schiller LPN, Dr. Raliegh Ip is full today and cannot see pt, however he suggests Roxy Cedar, DNP.  I called pt to inform her of appointment options, pt states she is feeling much better and declined appointment with [facility] today.  Pt states she was advised to take 2 pumps of albuterol (PROVENTIL HFA;VENTOLIN HFA) 108 (90 BASE) MCG/ACT inhaler by CAN.  Pt is requesting something for cough and congestion be called in and she would like a refill of albuterol (PROVENTIL HFA;VENTOLIN HFA) 108 (90 BASE) MCG/ACT inhaler.  Please call pt if something can be called in for cough and congestion.

## 2014-04-03 NOTE — Telephone Encounter (Signed)
Spoke to pt told her there is refills on her inhaler just needs to call the pharmacy. Also Rx for cough syrup ready to pickup, will be at the front desk. Pt verbalized understanding. Rx printed and signed.

## 2014-04-03 NOTE — Telephone Encounter (Signed)
Ok for RF Hydromet 6 ounces 1 teaspoon  every 6 hours as needed for cough and congestion

## 2014-04-17 ENCOUNTER — Encounter: Payer: Self-pay | Admitting: Internal Medicine

## 2014-04-17 ENCOUNTER — Ambulatory Visit (INDEPENDENT_AMBULATORY_CARE_PROVIDER_SITE_OTHER): Payer: Medicare HMO | Admitting: Internal Medicine

## 2014-04-17 ENCOUNTER — Telehealth: Payer: Self-pay | Admitting: Internal Medicine

## 2014-04-17 VITALS — BP 130/70 | HR 81 | Temp 98.0°F | Resp 20 | Ht 62.25 in | Wt 151.0 lb

## 2014-04-17 DIAGNOSIS — J441 Chronic obstructive pulmonary disease with (acute) exacerbation: Secondary | ICD-10-CM

## 2014-04-17 MED ORDER — FLUTICASONE FUROATE-VILANTEROL 100-25 MCG/INH IN AEPB: 1.0000 | INHALATION_SPRAY | Freq: Every day | RESPIRATORY_TRACT | Status: DC

## 2014-04-17 MED ORDER — METHYLPREDNISOLONE ACETATE 80 MG/ML IJ SUSP
80.0000 mg | Freq: Once | INTRAMUSCULAR | Status: AC
  Administered 2014-04-17: 80 mg via INTRAMUSCULAR

## 2014-04-17 MED ORDER — TRAMADOL HCL 50 MG PO TABS: ORAL_TABLET | ORAL | Status: DC

## 2014-04-17 NOTE — Patient Instructions (Signed)
Take over-the-counter expectorants and cough medications such as  Mucinex DM.  Call if there is no improvement in 5 to 7 days or if  you develop worsening cough, fever, or new symptoms, such as shortness of breath or chest pain. 

## 2014-04-17 NOTE — Progress Notes (Signed)
Subjective:    [name], female    DOB: September 08, 1946, 68 y.o.   MRN: [id number]  HPI  68 year old patient who has a long history of tobacco use.  She has been absent for approximately 19 years, but smoked for greater than 30 years.  For the past 2 and half months.  She has had increasing dyspnea on exertion and intermittent wheezing.  She has a long history of asthma and has used her rescue albuterol with benefit.  She describes chest congestion and cough.  Past Medical History  Diagnosis Date  . CHEST PAIN 11/20/2008  . COLONIC POLYPS, HX OF 05/24/2007  . DYSPNEA ON EXERTION 12/19/2008  . HYPERCHOLESTEROLEMIA 09/18/2008  . 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 POSTMENOPAUSAL SYNDROME 04/12/2008  . 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 Diverticular disease   . Peripheral neuropathy     History   Social History  . Marital Status: Married    Spouse Name: N/A    Number of Children: N/A  . Years of Education: N/A   Occupational History  . Not on file.   Social History Main Topics  . Smoking status: Former Smoker    Quit date: 10/13/1996  . Smokeless tobacco: Never Used  . Alcohol Use: No  . Drug Use: No  . Sexual Activity: Not on file   Other Topics Concern  . Not on file   Social History Narrative  . No narrative on file    Past Surgical History  Procedure Laterality Date  . Abdominal hysterectomy    . Lumbar laminectomy    . Nasal sinus surgery    . Shoulder surgery      Family History  Problem Relation Age of Onset  . Cancer Mother     died of lung Ca  . COPD Father   . Heart disease Father     died of MI  . Cancer Brother     bladder Ca  . COPD Brother     Allergies  Allergen Reactions  . Sulfonamide Derivatives     REACTION: headache    Current Outpatient Prescriptions on File Prior to Visit  Medication Sig Dispense Refill  . albuterol (PROVENTIL HFA;VENTOLIN HFA) 108 (90 BASE) MCG/ACT inhaler Inhale 2 puffs into the  lungs every 6 (six) hours as needed.  1 Inhaler  6  . HYDROcodone-homatropine (HYCODAN) 5-1.5 MG/5ML syrup Take 5 mLs by mouth every 6 (six) hours as needed for cough.  120 mL  0  . simvastatin (ZOCOR) 40 MG tablet TAKE ONE TABLET BY MOUTH EVERY DAY  90 tablet  3  . traMADol (ULTRAM) 50 MG tablet TAKE ONE TABLET BY MOUTH TWICE DAILY AS NEEDED FOR PAIN  120 tablet  5   No current facility-administered medications on file prior to visit.    BP 130/70  Pulse 81  Temp(Src) 98 F (36.7 C) (Oral)  Resp 20  Ht 5' 2.25" (1.581 m)  Wt 151 lb (68.493 kg)  BMI 27.40 kg/m2  SpO2 95%       Review of Systems  Constitutional: Negative.   HENT: Negative for congestion, dental problem, hearing loss, rhinorrhea, sinus pressure, sore throat and tinnitus.   Eyes: Negative for pain, discharge and visual disturbance.  Respiratory: Positive for cough, shortness of breath and wheezing.   Cardiovascular: Negative for chest pain, palpitations and leg swelling.  Gastrointestinal: Negative for nausea, vomiting, abdominal pain, diarrhea, constipation, blood in stool and abdominal distention.  Genitourinary: Negative for  dysuria, urgency, frequency, hematuria, flank pain, vaginal bleeding, vaginal discharge, difficulty urinating, vaginal pain and pelvic pain.  Musculoskeletal: Negative for arthralgias, gait problem and joint swelling.  Skin: Negative for rash.  Neurological: Negative for dizziness, syncope, speech difficulty, weakness, numbness and headaches.  Hematological: Negative for adenopathy.  Psychiatric/Behavioral: Negative for behavioral problems, dysphoric mood and agitation. The patient is not nervous/anxious.        Objective:   Physical Exam  Constitutional: She is oriented to person, place, and time. She appears well-developed and well-nourished.  HENT:  Head: Normocephalic.  Right Ear: External ear normal.  Left Ear: External ear normal.  Mouth/Throat: Oropharynx is clear and moist.    Eyes: Conjunctivae and EOM are normal. Pupils are equal, round, and reactive to light.  Neck: Normal range of motion. Neck supple. No thyromegaly present.  Cardiovascular: Normal rate, regular rhythm, normal heart sounds and intact distal pulses.   Pulmonary/Chest: Effort normal. She has wheezes.  Abdominal: Soft. Bowel sounds are normal. She exhibits no mass. There is no tenderness.  Musculoskeletal: Normal range of motion.  Lymphadenopathy:    She has no cervical adenopathy.  Neurological: She is alert and oriented to person, place, and time.  Skin: Skin is warm and dry. No rash noted.  Psychiatric: She has a normal mood and affect. Her behavior is normal.          Assessment & Plan:   COPD exacerbation.  We'll continue albuterol rescue.  We'll treat with Depo-Medrol 80.  We'll place on maintenance medication.  Recheck 6 weeks

## 2014-04-17 NOTE — Telephone Encounter (Signed)
Pt requesting refill of traMADol (ULTRAM) 50 MG tablet, asking for quantity of 120, instead of 150 that she was previosly getting.  Pharmacy: [facility].

## 2014-04-17 NOTE — Progress Notes (Signed)
Pre visit review using our clinic review tool, if applicable. No additional management support is needed unless otherwise documented below in the visit note. 

## 2014-04-17 NOTE — Telephone Encounter (Signed)
Pt notified Rx called into pharmacy for her.

## 2014-04-18 ENCOUNTER — Ambulatory Visit: Payer: Medicare HMO | Admitting: Internal Medicine

## 2014-05-29 ENCOUNTER — Encounter: Payer: Self-pay | Admitting: Internal Medicine

## 2014-05-29 ENCOUNTER — Ambulatory Visit (INDEPENDENT_AMBULATORY_CARE_PROVIDER_SITE_OTHER): Payer: Medicare HMO | Admitting: Internal Medicine

## 2014-05-29 VITALS — BP 120/60 | HR 68 | Temp 98.4°F | Resp 18 | Ht 62.25 in | Wt 151.0 lb

## 2014-05-29 DIAGNOSIS — E7800 Pure hypercholesterolemia, unspecified: Secondary | ICD-10-CM

## 2014-05-29 DIAGNOSIS — G9009 Other idiopathic peripheral autonomic neuropathy: Secondary | ICD-10-CM

## 2014-05-29 DIAGNOSIS — J441 Chronic obstructive pulmonary disease with (acute) exacerbation: Secondary | ICD-10-CM

## 2014-05-29 NOTE — Patient Instructions (Signed)
Return in 3 months for follow-up  

## 2014-05-29 NOTE — Progress Notes (Signed)
Pre visit review using our clinic review tool, if applicable. No additional management support is needed unless otherwise documented below in the visit note. 

## 2014-05-29 NOTE — Progress Notes (Signed)
Subjective:    [name], female    DOB: 14-Mar-1946, 68 y.o.   MRN: [id number]  HPI 68 year old patient who was treated approximately 6 weeks ago for exacerbation of COPD.  She has done quite well and presently is symptom-free.  She has dyslipidemia which has been stable No albuterol use  Past Medical History  Diagnosis Date  . CHEST PAIN 11/20/2008  . COLONIC POLYPS, HX OF 05/24/2007  . DYSPNEA ON EXERTION 12/19/2008  . HYPERCHOLESTEROLEMIA 09/18/2008  . 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 POSTMENOPAUSAL SYNDROME 04/12/2008  . 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 Diverticular disease   . Peripheral neuropathy     History   Social History  . Marital Status: Married    Spouse Name: N/A    Number of Children: N/A  . Years of Education: N/A   Occupational History  . Not on file.   Social History Main Topics  . Smoking status: Former Smoker    Quit date: 10/13/1996  . Smokeless tobacco: Never Used  . Alcohol Use: No  . Drug Use: No  . Sexual Activity: Not on file   Other Topics Concern  . Not on file   Social History Narrative  . No narrative on file    Past Surgical History  Procedure Laterality Date  . Abdominal hysterectomy    . Lumbar laminectomy    . Nasal sinus surgery    . Shoulder surgery      Family History  Problem Relation Age of Onset  . Cancer Mother     died of lung Ca  . COPD Father   . Heart disease Father     died of MI  . Cancer Brother     bladder Ca  . COPD Brother     Allergies  Allergen Reactions  . Sulfonamide Derivatives     REACTION: headache    Current Outpatient Prescriptions on File Prior to Visit  Medication Sig Dispense Refill  . albuterol (PROVENTIL HFA;VENTOLIN HFA) 108 (90 BASE) MCG/ACT inhaler Inhale 2 puffs into the lungs every 6 (six) hours as needed.  1 Inhaler  6  . Fluticasone Furoate-Vilanterol (BREO ELLIPTA) 100-25 MCG/INH AEPB Inhale 1 puff into the lungs daily.  28 each  6  .  simvastatin (ZOCOR) 40 MG tablet TAKE ONE TABLET BY MOUTH EVERY DAY  90 tablet  3  . traMADol (ULTRAM) 50 MG tablet TAKE ONE TABLET BY MOUTH TWICE DAILY AS NEEDED FOR PAIN  120 tablet  5   No current facility-administered medications on file prior to visit.    BP 120/60  Pulse 68  Temp(Src) 98.4 F (36.9 C) (Oral)  Resp 18  Ht 5' 2.25" (1.581 m)  Wt 151 lb (68.493 kg)  BMI 27.40 kg/m2  SpO2 97%     Review of Systems  Constitutional: Negative.   HENT: Negative for congestion, dental problem, hearing loss, rhinorrhea, sinus pressure, sore throat and tinnitus.   Eyes: Negative for pain, discharge and visual disturbance.  Respiratory: Negative for cough and shortness of breath.   Cardiovascular: Negative for chest pain, palpitations and leg swelling.  Gastrointestinal: Negative for nausea, vomiting, abdominal pain, diarrhea, constipation, blood in stool and abdominal distention.  Genitourinary: Negative for dysuria, urgency, frequency, hematuria, flank pain, vaginal bleeding, vaginal discharge, difficulty urinating, vaginal pain and pelvic pain.  Musculoskeletal: Negative for arthralgias, gait problem and joint swelling.  Skin: Negative for rash.  Neurological: Negative for dizziness, syncope, speech difficulty, weakness, numbness  and headaches.  Hematological: Negative for adenopathy.  Psychiatric/Behavioral: Negative for behavioral problems, dysphoric mood and agitation. The patient is not nervous/anxious.        Objective:   Physical Exam  Constitutional: She is oriented to person, place, and time. She appears well-developed and well-nourished.  HENT:  Head: Normocephalic.  Right Ear: External ear normal.  Left Ear: External ear normal.  Mouth/Throat: Oropharynx is clear and moist.  Eyes: Conjunctivae and EOM are normal. Pupils are equal, round, and reactive to light.  Neck: Normal range of motion. Neck supple. No thyromegaly present.  Cardiovascular: Normal rate, regular  rhythm, normal heart sounds and intact distal pulses.   Pulmonary/Chest: Effort normal and breath sounds normal. No respiratory distress. She has no wheezes. She has no rales.  Abdominal: Soft. Bowel sounds are normal. She exhibits no mass. There is no tenderness.  Musculoskeletal: Normal range of motion.  Lymphadenopathy:    She has no cervical adenopathy.  Neurological: She is alert and oriented to person, place, and time.  Skin: Skin is warm and dry. No rash noted.  Psychiatric: She has a normal mood and affect. Her behavior is normal.          Assessment & Plan:    COPD exacerbation.  Resolved Dyslipidemia  Continue when necessary albuterol Schedule CPX

## 2014-07-10 ENCOUNTER — Other Ambulatory Visit: Payer: Self-pay | Admitting: Internal Medicine

## 2014-07-18 ENCOUNTER — Telehealth: Payer: Self-pay | Admitting: Internal Medicine

## 2014-07-18 MED ORDER — HYDROCODONE-HOMATROPINE 5-1.5 MG/5ML PO SYRP: 5.0000 mL | ORAL_SOLUTION | Freq: Four times a day (QID) | ORAL | Status: DC | PRN

## 2014-07-18 NOTE — Telephone Encounter (Signed)
ok 

## 2014-07-18 NOTE — Telephone Encounter (Signed)
Pt was last seen on 8-17 and would like a new rx hydrocod-hom cough syrup. Pt has a cough

## 2014-07-18 NOTE — Telephone Encounter (Signed)
Please advise 

## 2014-07-18 NOTE — Telephone Encounter (Signed)
Pt notified Rx ready for pickup. Rx printed and signed.  

## 2014-08-02 LAB — HM MAMMOGRAPHY

## 2014-08-08 ENCOUNTER — Encounter: Payer: Self-pay | Admitting: *Deleted

## 2014-08-08 ENCOUNTER — Ambulatory Visit (INDEPENDENT_AMBULATORY_CARE_PROVIDER_SITE_OTHER): Payer: Medicare HMO | Admitting: Internal Medicine

## 2014-08-08 ENCOUNTER — Encounter: Payer: Self-pay | Admitting: Internal Medicine

## 2014-08-08 VITALS — BP 118/70 | HR 67 | Temp 97.9°F | Resp 18 | Ht 62.5 in | Wt 149.0 lb

## 2014-08-08 DIAGNOSIS — Z0000 Encounter for general adult medical examination without abnormal findings: Secondary | ICD-10-CM

## 2014-08-08 DIAGNOSIS — E7800 Pure hypercholesterolemia, unspecified: Secondary | ICD-10-CM

## 2014-08-08 DIAGNOSIS — G9009 Other idiopathic peripheral autonomic neuropathy: Secondary | ICD-10-CM

## 2014-08-08 DIAGNOSIS — Z23 Encounter for immunization: Secondary | ICD-10-CM

## 2014-08-08 DIAGNOSIS — J4520 Mild intermittent asthma, uncomplicated: Secondary | ICD-10-CM

## 2014-08-08 LAB — CBC WITH DIFFERENTIAL/PLATELET
BASOS ABS: 0.1 10*3/uL (ref 0.0–0.1)
Basophils Relative: 0.8 % (ref 0.0–3.0)
Eosinophils Absolute: 0.4 10*3/uL (ref 0.0–0.7)
Eosinophils Relative: 5.2 % — ABNORMAL HIGH (ref 0.0–5.0)
HCT: 38.7 % (ref 36.0–46.0)
HEMOGLOBIN: 12.7 g/dL (ref 12.0–15.0)
Lymphocytes Relative: 35 % (ref 12.0–46.0)
Lymphs Abs: 2.5 10*3/uL (ref 0.7–4.0)
MCHC: 32.7 g/dL (ref 30.0–36.0)
MCV: 91.7 fl (ref 78.0–100.0)
MONOS PCT: 8.8 % (ref 3.0–12.0)
Monocytes Absolute: 0.6 10*3/uL (ref 0.1–1.0)
NEUTROS ABS: 3.6 10*3/uL (ref 1.4–7.7)
Neutrophils Relative %: 50.2 % (ref 43.0–77.0)
Platelets: 227 10*3/uL (ref 150.0–400.0)
RBC: 4.22 Mil/uL (ref 3.87–5.11)
RDW: 14.1 % (ref 11.5–15.5)
WBC: 7.1 10*3/uL (ref 4.0–10.5)

## 2014-08-08 LAB — COMPREHENSIVE METABOLIC PANEL
ALT: 14 U/L (ref 0–35)
AST: 18 U/L (ref 0–37)
Albumin: 3.6 g/dL (ref 3.5–5.2)
Alkaline Phosphatase: 65 U/L (ref 39–117)
BUN: 11 mg/dL (ref 6–23)
CHLORIDE: 104 meq/L (ref 96–112)
CO2: 27 meq/L (ref 19–32)
CREATININE: 0.8 mg/dL (ref 0.4–1.2)
Calcium: 10.4 mg/dL (ref 8.4–10.5)
GFR: 74.26 mL/min (ref >60.00)
Glucose, Bld: 89 mg/dL (ref 70–99)
Potassium: 4.1 mEq/L (ref 3.5–5.1)
Sodium: 139 mEq/L (ref 135–145)
Total Bilirubin: 1.1 mg/dL (ref 0.2–1.2)
Total Protein: 7.6 g/dL (ref 6.0–8.3)

## 2014-08-08 LAB — TSH: TSH: 1.05 u[IU]/mL (ref 0.35–4.50)

## 2014-08-08 LAB — LIPID PANEL
Cholesterol: 173 mg/dL (ref 0–200)
HDL: 53.1 mg/dL (ref >39.00)
LDL Cholesterol: 86 mg/dL (ref 0–99)
NonHDL: 119.9
TRIGLYCERIDES: 169 mg/dL — AB (ref 0.0–149.0)
Total CHOL/HDL Ratio: 3
VLDL: 33.8 mg/dL (ref 0.0–40.0)

## 2014-08-08 MED ORDER — BECLOMETHASONE DIPROPIONATE 80 MCG/ACT IN AERS: 2.0000 | INHALATION_SPRAY | Freq: Every day | RESPIRATORY_TRACT | Status: DC

## 2014-08-08 NOTE — Patient Instructions (Signed)
It is important that you exercise regularly, at least 20 minutes 3 to 4 times per week.  If you develop chest pain or shortness of breath seek  medical attention.  Return in one year for follow-up  Take a calcium supplement, plus [phone] units of vitamin DHealth Maintenance Adopting a healthy lifestyle and getting preventive care can go a long way to promote health and wellness. Talk with your health care provider about what schedule of regular examinations is right for you. This is a good chance for you to check in with your provider about disease prevention and staying healthy. In between checkups, there are plenty of things you can do on your own. Experts have done a lot of research about which lifestyle changes and preventive measures are most likely to keep you healthy. Ask your health care provider for more information. WEIGHT AND DIET  Eat a healthy diet  Be sure to include plenty of vegetables, fruits, low-fat dairy products, and lean protein.  Do not eat a lot of foods high in solid fats, added sugars, or salt.  Get regular exercise. This is one of the most important things you can do for your health.  Most adults should exercise for at least 150 minutes each week. The exercise should increase your heart rate and make you sweat (moderate-intensity exercise).  Most adults should also do strengthening exercises at least twice a week. This is in addition to the moderate-intensity exercise.  Maintain a healthy weight  Body mass index (BMI) is a measurement that can be used to identify possible weight problems. It estimates body fat based on height and weight. Your health care provider can help determine your BMI and help you achieve or maintain a healthy weight.  For females 65 years of age and older:   A BMI below 18.5 is considered underweight.  A BMI of 18.5 to 24.9 is normal.  A BMI of 25 to 29.9 is considered overweight.  A BMI of 30 and above is considered obese.   Watch levels of cholesterol and blood lipids  You should start having your blood tested for lipids and cholesterol at 70 years of age, then have this test every 5 years.  You may need to have your cholesterol levels checked more often if:  Your lipid or cholesterol levels are high.  You are older than 70 years of age.  You are at high risk for heart disease.  CANCER SCREENING   Lung Cancer  Lung cancer screening is recommended for adults 36-58 years old who are at high risk for lung cancer because of a history of smoking.  A yearly low-dose CT scan of the lungs is recommended for people who:  Currently smoke.  Have quit within the past 15 years.  Have at least a 30-pack-year history of smoking. A pack year is smoking an average of one pack of cigarettes a day for 1 year.  Yearly screening should continue until it has been 15 years since you quit.  Yearly screening should stop if you develop a health problem that would prevent you from having lung cancer treatment.  Breast Cancer  Practice breast self-awareness. This means understanding how your breasts normally appear and feel.  It also means doing regular breast self-exams. Let your health care provider know about any changes, no matter how small.  If you are in your 20s or 30s, you should have a clinical breast exam (CBE) by a health care provider every 1-3 years as part of  a regular health exam.  If you are 45 or older, have a CBE every year. Also consider having a breast X-ray (mammogram) every year.  If you have a family history of breast cancer, talk to your health care provider about genetic screening.  If you are at high risk for breast cancer, talk to your health care provider about having an MRI and a mammogram every year.  Breast cancer gene (BRCA) assessment is recommended for women who have family members with BRCA-related cancers. BRCA-related cancers  include:  Breast.  Ovarian.  Tubal.  Peritoneal cancers.  Results of the assessment will determine the need for genetic counseling and BRCA1 and BRCA2 testing. Cervical Cancer Routine pelvic examinations to screen for cervical cancer are no longer recommended for nonpregnant women who are considered low risk for cancer of the pelvic organs (ovaries, uterus, and vagina) and who do not have symptoms. A pelvic examination may be necessary if you have symptoms including those associated with pelvic infections. Ask your health care provider if a screening pelvic exam is right for you.   The Pap test is the screening test for cervical cancer for women who are considered at risk.  If you had a hysterectomy for a problem that was not cancer or a condition that could lead to cancer, then you no longer need Pap tests.  If you are older than 65 years, and you have had normal Pap tests for the past 10 years, you no longer need to have Pap tests.  If you have had past treatment for cervical cancer or a condition that could lead to cancer, you need Pap tests and screening for cancer for at least 20 years after your treatment.  If you no longer get a Pap test, assess your risk factors if they change (such as having a new sexual partner). This can affect whether you should start being screened again.  Some women have medical problems that increase their chance of getting cervical cancer. If this is the case for you, your health care provider may recommend more frequent screening and Pap tests.  The human papillomavirus (HPV) test is another test that may be used for cervical cancer screening. The HPV test looks for the virus that can cause cell changes in the cervix. The cells collected during the Pap test can be tested for HPV.  The HPV test can be used to screen women 17 years of age and older. Getting tested for HPV can extend the interval between normal Pap tests from three to five years.  An HPV  test also should be used to screen women of any age who have unclear Pap test results.  After 70 years of age, women should have HPV testing as often as Pap tests.  Colorectal Cancer  This type of cancer can be detected and often prevented.  Routine colorectal cancer screening usually begins at 70 years of age and continues through 70 years of age.  Your health care provider may recommend screening at an earlier age if you have risk factors for colon cancer.  Your health care provider may also recommend using home test kits to check for hidden blood in the stool.  A small camera at the end of a tube can be used to examine your colon directly (sigmoidoscopy or colonoscopy). This is done to check for the earliest forms of colorectal cancer.  Routine screening usually begins at age 13.  Direct examination of the colon should be repeated every 5-10 years through  70 years of age. However, you may need to be screened more often if early forms of precancerous polyps or small growths are found. Skin Cancer  Check your skin from head to toe regularly.  Tell your health care provider about any new moles or changes in moles, especially if there is a change in a mole's shape or color.  Also tell your health care provider if you have a mole that is larger than the size of a pencil eraser.  Always use sunscreen. Apply sunscreen liberally and repeatedly throughout the day.  Protect yourself by wearing long sleeves, pants, a wide-brimmed hat, and sunglasses whenever you are outside. HEART DISEASE, DIABETES, AND HIGH BLOOD PRESSURE   Have your blood pressure checked at least every 1-2 years. High blood pressure causes heart disease and increases the risk of stroke.  If you are between 34 years and 81 years old, ask your health care provider if you should take aspirin to prevent strokes.  Have regular diabetes screenings. This involves taking a blood sample to check your fasting blood sugar  level.  If you are at a normal weight and have a low risk for diabetes, have this test once every three years after 70 years of age.  If you are overweight and have a high risk for diabetes, consider being tested at a younger age or more often. PREVENTING INFECTION  Hepatitis B  If you have a higher risk for hepatitis B, you should be screened for this virus. You are considered at high risk for hepatitis B if:  You were born in a country where hepatitis B is common. Ask your health care provider which countries are considered high risk.  Your parents were born in a high-risk country, and you have not been immunized against hepatitis B (hepatitis B vaccine).  You have HIV or AIDS.  You use needles to inject street drugs.  You live with someone who has hepatitis B.  You have had sex with someone who has hepatitis B.  You get hemodialysis treatment.  You take certain medicines for conditions, including cancer, organ transplantation, and autoimmune conditions. Hepatitis C  Blood testing is recommended for:  Everyone born from [birth year] through 1967.  Anyone with known risk factors for hepatitis C. Sexually transmitted infections (STIs)  You should be screened for sexually transmitted infections (STIs) including gonorrhea and chlamydia if:  You are sexually active and are younger than 70 years of age.  You are older than 70 years of age and your health care provider tells you that you are at risk for this type of infection.  Your sexual activity has changed since you were last screened and you are at an increased risk for chlamydia or gonorrhea. Ask your health care provider if you are at risk.  If you do not have HIV, but are at risk, it may be recommended that you take a prescription medicine daily to prevent HIV infection. This is called pre-exposure prophylaxis (PrEP). You are considered at risk if:  You are sexually active and do not regularly use condoms or know the HIV status  of your partner(s).  You take drugs by injection.  You are sexually active with a partner who has HIV. Talk with your health care provider about whether you are at high risk of being infected with HIV. If you choose to begin PrEP, you should first be tested for HIV. You should then be tested every 3 months for as long as you are taking PrEP.  PREGNANCY   If you are premenopausal and you may become pregnant, ask your health care provider about preconception counseling.  If you may become pregnant, take 400 to 800 micrograms (mcg) of folic acid every day.  If you want to prevent pregnancy, talk to your health care provider about birth control (contraception). OSTEOPOROSIS AND MENOPAUSE   Osteoporosis is a disease in which the bones lose minerals and strength with aging. This can result in serious bone fractures. Your risk for osteoporosis can be identified using a bone density scan.  If you are 17 years of age or older, or if you are at risk for osteoporosis and fractures, ask your health care provider if you should be screened.  Ask your health care provider whether you should take a calcium or vitamin D supplement to lower your risk for osteoporosis.  Menopause may have certain physical symptoms and risks.  Hormone replacement therapy may reduce some of these symptoms and risks. Talk to your health care provider about whether hormone replacement therapy is right for you.  HOME CARE INSTRUCTIONS   Schedule regular health, dental, and eye exams.  Stay current with your immunizations.   Do not use any tobacco products including cigarettes, chewing tobacco, or electronic cigarettes.  If you are pregnant, do not drink alcohol.  If you are breastfeeding, limit how much and how often you drink alcohol.  Limit alcohol intake to no more than 1 drink per day for nonpregnant women. One drink equals 12 ounces of beer, 5 ounces of wine, or 1 ounces of hard liquor.  Do not use street  drugs.  Do not share needles.  Ask your health care provider for help if you need support or information about quitting drugs.  Tell your health care provider if you often feel depressed.  Tell your health care provider if you have ever been abused or do not feel safe at home. Document Released: 04/14/2011 Document Revised: 02/13/2014 Document Reviewed: 08/31/2013 [facility] Patient Information 2015 [place], [place]. This information is not intended to replace advice given to you by your health care provider. Make sure you discuss any questions you have with your health care provider.

## 2014-08-08 NOTE — Progress Notes (Signed)
Pre visit review using our clinic review tool, if applicable. No additional management support is needed unless otherwise documented below in the visit note. 

## 2014-08-08 NOTE — Progress Notes (Signed)
[name], female   DOB: Dec 18, 1945, 68 y.o.   MRN: [id number]  Subjective:    [name], female    DOB: 05/19/1946, 68 y.o.   MRN: [id number]  HPI   CC: cpx - doing well.   History of Present Illness:   68  year-old patient who is seen today for a wellness exam.  She has a history of complex diverticular disease and is status post  Colonoscopy in 2011. She has a history menopausal syndrome and also a history of idiopathic peripheral autonomic neuropathy. She has chronic tinnitus.  She has a history of mild asthma as well as dyslipidemia. She states that she requires albuterol use several times per week.  Mammogram obtained last week   Here for Medicare AWV:  1. Risk factors based on Past M, S, F history: risk factors include hypercholesterolemia. She denies any cardiopulmonary complaints  2. Physical Activities: remains active, walks daily,does outdoor gardening  3. Depression/mood: no history of depression or mood disorder  4. Hearing: mild impairment. She states this is being fitted for hearing aids. She does have chronic tinnitus  5. ADL's: independent in all aspects of daily living  6. Fall Risk: Moderate due to PN, but no history of falls 7. Home Safety: no problems identified  8. Height, weight, &visual acuity:height and weight are stable with glasses, and no change in her visual acuity  9. Counseling: exercise discussed and encouraged  10. Labs ordered based on risk factors: laboratory profile, including lipid panel will be reviewed  11. Referral Coordination- mammogram, encouraged  12. Care Plan- will continue calcium and vitamin D supplementation, more regular exercise regimen encouraged  13. Cognitive Assessment- alert and oriented, with normal affect. No difficulty handling all executive functions. No memory deficit  14.  Preventive services include annual health examination with mammogram and screening lab. Patient was provided with a written  and personalized care plan.  Will need follow-up colonoscopy next year 15.  Provider list includes primary care radiology and GI.  Preventive Screening-Counseling & Management  Alcohol-Tobacco  Smoking Status: never   Allergies:  1) ! Sulfa   Past History:  Past Medical History:   Colonic polyps, hx of  Mild Hypercholesterolemia  complex diverticular disease  peripheral neuropathy  Tinnitus  remote history of asthma   Past Surgical History:  Hysterectomy 96  Lumbar laminectomy 01  Nasal sx  Right shoulder sx  Colonoscopy-09/07/2004, 2011  1996 history of diverticular abscess   Family History:   Fam hx MI  Family History Diabetes 1st degree relative  Family History Lung cancer  Fam hx COPD  father died of an MI at age 16. History of COPD  mother died of lung cancer at 90  One brother, COPD, bladder Ca (deceased)  Three sisters, one died at 12 weeks of age  one sister died at 53 complications of diabetes   Social History:   Married  discontinue smoking 85 years ago Son died age 33 massive MI  Past Medical History  Diagnosis Date  . CHEST PAIN 11/20/2008  . COLONIC POLYPS, HX OF 05/24/2007  . DYSPNEA ON EXERTION 12/19/2008  . HYPERCHOLESTEROLEMIA 09/18/2008  . 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 POSTMENOPAUSAL SYNDROME 04/12/2008  . 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 Diverticular disease   . Peripheral neuropathy     History   Social History  . Marital Status: Married    Spouse Name: N/A    Number of Children: N/A  .  Years of Education: N/A   Occupational History  . Not on file.   Social History Main Topics  . Smoking status: Former Smoker    Quit date: 10/13/1996  . Smokeless tobacco: Never Used  . Alcohol Use: No  . Drug Use: No  . Sexual Activity: Not on file   Other Topics Concern  . Not on file   Social History Narrative  . No narrative on file    Past Surgical History  Procedure Laterality Date  . Abdominal  hysterectomy    . Lumbar laminectomy    . Nasal sinus surgery    . Shoulder surgery      Family History  Problem Relation Age of Onset  . Cancer Mother     died of lung Ca  . COPD Father   . Heart disease Father     died of MI  . Cancer Brother     bladder Ca  . COPD Brother     Allergies  Allergen Reactions  . Sulfonamide Derivatives     REACTION: headache    Current Outpatient Prescriptions on File Prior to Visit  Medication Sig Dispense Refill  . albuterol (PROVENTIL HFA;VENTOLIN HFA) 108 (90 BASE) MCG/ACT inhaler Inhale 2 puffs into the lungs every 6 (six) hours as needed.  1 Inhaler  6  . HYDROcodone-homatropine (HYCODAN) 5-1.5 MG/5ML syrup Take 5 mLs by mouth every 6 (six) hours as needed for cough.  120 mL  0  . simvastatin (ZOCOR) 40 MG tablet TAKE ONE TABLET BY MOUTH ONCE DAILY  90 tablet  3  . traMADol (ULTRAM) 50 MG tablet TAKE ONE TABLET BY MOUTH TWICE DAILY AS NEEDED FOR PAIN  120 tablet  5   No current facility-administered medications on file prior to visit.    BP 118/70  Pulse 67  Temp(Src) 97.9 F (36.6 C) (Oral)  Resp 18  Ht 5' 2.5" (1.588 m)  Wt 149 lb (67.586 kg)  BMI 26.80 kg/m2  SpO2 95%     Review of Systems  Constitutional: Negative for fever, appetite change, fatigue and unexpected weight change.  HENT: Positive for tinnitus (chronic right-sided tinnitus). Negative for congestion, dental problem, ear pain, hearing loss ( uses a hearing in frequently), mouth sores, nosebleeds, sinus pressure, sore throat, trouble swallowing and voice change.   Eyes: Negative for photophobia, pain, redness and visual disturbance.  Respiratory: Negative for cough, chest tightness and shortness of breath.   Cardiovascular: Negative for chest pain, palpitations and leg swelling.  Gastrointestinal: Negative for nausea, vomiting, abdominal pain, diarrhea, constipation, blood in stool, abdominal distention and rectal pain.  Genitourinary: Negative for dysuria,  urgency, frequency, hematuria, flank pain, vaginal bleeding, vaginal discharge, difficulty urinating, genital sores, vaginal pain, menstrual problem and pelvic pain.  Musculoskeletal: Negative for arthralgias, back pain and neck stiffness.  Skin: Negative for rash.  Neurological: Negative for dizziness, syncope, speech difficulty, weakness, light-headedness, numbness and headaches.  Hematological: Negative for adenopathy. Does not bruise/bleed easily.  Psychiatric/Behavioral: Negative for suicidal ideas, behavioral problems, self-injury, dysphoric mood and agitation. The patient is not nervous/anxious.        Objective:   Physical Exam  Constitutional: She is oriented to person, place, and time. She appears well-developed and well-nourished.  HENT:  Head: Normocephalic and atraumatic.  Right Ear: External ear normal.  Left Ear: External ear normal.  Mouth/Throat: Oropharynx is clear and moist.  Edentulous  Eyes: Conjunctivae and EOM are normal.  Neck: Normal range of motion. Neck supple. No JVD  present. No thyromegaly present.  Cardiovascular: Normal rate, regular rhythm, normal heart sounds and intact distal pulses.   No murmur heard. Pedal pulses faint  Pulmonary/Chest: Effort normal and breath sounds normal. She has no wheezes. She has no rales.  Abdominal: Soft. Bowel sounds are normal. She exhibits no distension and no mass. There is no tenderness. There is no rebound and no guarding.  Musculoskeletal: Normal range of motion. She exhibits no edema and no tenderness.  Neurological: She is alert and oriented to person, place, and time. She has normal reflexes. No cranial nerve deficit. She exhibits normal muscle tone. Coordination normal.  Skin: Skin is warm and dry. No rash noted.  Psychiatric: She has a normal mood and affect. Her behavior is normal.          Assessment & Plan:   Preventive health exam.  Pneumovax dispensed; flu vaccine, declined Hypercholesterolemia. We'll  check a lipid profile  History of colonic polyps. Followup colonoscopy in 2016 Asthma mild and stable. Continue when necessary albuterol.  Add Pulmicort Recheck 1 year or as needed

## 2014-08-14 ENCOUNTER — Telehealth: Payer: Self-pay | Admitting: Internal Medicine

## 2014-08-14 DIAGNOSIS — J45909 Unspecified asthma, uncomplicated: Secondary | ICD-10-CM

## 2014-08-14 NOTE — Telephone Encounter (Signed)
ok 

## 2014-08-14 NOTE — Telephone Encounter (Signed)
Left message will call back.

## 2014-08-14 NOTE — Telephone Encounter (Signed)
Pt called back told referral to Pulmonary sent and someone will contact her.

## 2014-08-14 NOTE — Telephone Encounter (Signed)
Patient Information:  Caller Name: Merrilee Jansky  Phone: [phone]  Patient: Susan Collins, Susan Collins  Gender: Female  DOB: August 16, 1946  Age: 68 Years  PCP: Bluford Kaufmann (Family Practice > 61yrs old)  Office Follow Up:  Does the office need to follow up with this patient?: Yes  Instructions For The Office: ASthma plan not working  RN Note:  Patient is calling requesting consideration of a referral to asthma specialist.  States "Current asthma treatment plan not working."  Using Qvar QD,  with rescue Albuterol inhaler q 6 PRN, for a month now, and does not feel like she is getting good relief. Requesting a call back.  Symptoms  Reason For Call & Symptoms: breathing treatments not working  Reviewed Health History In EMR: Yes  Reviewed Medications In EMR: Yes  Reviewed Allergies In EMR: Yes  Reviewed Surgeries / Procedures: Yes  Date of Onset of Symptoms: 08/12/2014  Guideline(s) Used:  Asthma Attack  Disposition Per Guideline:   See Today or Tomorrow in Office  Reason For Disposition Reached:   Mild asthma attack (e.g., no SOB at rest, mild SOB with walking, speaks normally in sentences, mild wheezing) and persists > 24 hours on appropriate treatment  Advice Given:  Call Back If:  You become worse.  Patient Refused Recommendation:  Patient Refused Care Advice  REquesting referral

## 2014-08-14 NOTE — Telephone Encounter (Signed)
Dr. Raliegh Ip, okay to do Pulmonary referral for pt for Asthma.

## 2014-08-17 ENCOUNTER — Encounter: Payer: Self-pay | Admitting: Internal Medicine

## 2014-08-18 ENCOUNTER — Telehealth: Payer: Self-pay | Admitting: Internal Medicine

## 2014-08-18 NOTE — Telephone Encounter (Signed)
Called pt, she if feeling better.  She does not seem SOB.  She took the inhaler and now sitting and watching TV.  She stated she did not need to come in.  Told pt to cal back if symptoms worse or go directly to the emergency room.  Pt verbalized understanding and had no questions

## 2014-08-18 NOTE — Telephone Encounter (Signed)
Patient Information:  Caller Name: Beatris  Phone: [phone]  Patient: Susan Collins, Susan Collins  Gender: Female  DOB: 1946/10/10  Age: 68 Years  PCP: Bluford Kaufmann (Family Practice > 74yrs old)  Office Follow Up:  Does the office need to follow up with this patient?: Yes  Instructions For The Office: She has not been taking her Albuterol (she though Dr. Raliegh Ip said to stop it and take Qvar instead). Advised to take Albuterol 2 puffs 20 min apart and then q 4 hour for Asthma (speaking in phrases and short of breath). Go to office now disposition. PLEASE CALL HER - SHE MAY NEED TO GO TO ED IF SHE CANNOT BE SEEN IN OFFICE TODAY. REQUESTING REFILL ON HYCODAN. MAY NEED ORAL STEROID UNTIL SHE CAN BE CHECKED. Denies chest pain or dizziness.   Symptoms  Reason For Call & Symptoms: Having shortness of breath for past few weeks. Seen in the office on 08/08/14 and started on Qvar and she thought Dr. Raliegh Ip wanted her to stop taking her Albuterol. She is speaking in phrases and is short of breath at rest. She is almost out of Hycodan-cough med. Afebrile.  Reviewed Health History In EMR: Yes  Reviewed Medications In EMR: Yes  Reviewed Allergies In EMR: Yes  Reviewed Surgeries / Procedures: Yes  Date of Onset of Symptoms: 07/27/2014  Treatments Tried: humidifier, Hycodan  Treatments Tried Worked: No  Guideline(s) Used:  Asthma Attack  Disposition Per Guideline:   Go to Office Now  Reason For Disposition Reached:   Moderate asthma attack (e.g., SOB at rest, speaks in phrases, audible wheezes) and not resolved after 2 nebulizer or inhaler treatments given 20 minutes apart  Advice Given:  Quick-Relief Asthma Medicine:   Start your quick-relief medicine (e.g., albuterol, salbutamol) at the first sign of any coughing or shortness of breath (don't wait for wheezing). Use your inhaler (2 puffs each time) or nebulizer every 4 hours. Continue the quick-relief medicine until you have not wheezed or coughed for 48  hours.  The best "cough medicine" for an adult with asthma is always the asthma medicine (Note: Don't use cough suppressants, but cough drops may help a tickly cough).  Humidifier:   If the air is dry, use a cool mist humidifier to prevent drying of the upper airway.  Drinking Liquids:  Try to drink normal amount of liquids (e.g., water). Being adequately hydrated makes it easier to cough up the sticky lung mucus.  Expected Course:  If treatment is started early, most asthma attacks are quickly brought under control. All wheezing should be gone by 5 days.  Call Back If:  Inhaled asthma medicine (nebulizer or inhaler) is needed more often than every 4 hours  Wheezing has not completely cleared after 5 days  You become worse.  Patient Will Follow Care Advice:  YES

## 2014-08-22 ENCOUNTER — Telehealth: Payer: Self-pay | Admitting: Internal Medicine

## 2014-08-22 ENCOUNTER — Ambulatory Visit (INDEPENDENT_AMBULATORY_CARE_PROVIDER_SITE_OTHER): Payer: Medicare HMO | Admitting: Family Medicine

## 2014-08-22 ENCOUNTER — Encounter: Payer: Self-pay | Admitting: Family Medicine

## 2014-08-22 VITALS — BP 140/69 | HR 82 | Temp 98.2°F | Ht 62.5 in | Wt 150.0 lb

## 2014-08-22 DIAGNOSIS — J441 Chronic obstructive pulmonary disease with (acute) exacerbation: Secondary | ICD-10-CM

## 2014-08-22 DIAGNOSIS — J209 Acute bronchitis, unspecified: Secondary | ICD-10-CM

## 2014-08-22 MED ORDER — AZITHROMYCIN 250 MG PO TABS: ORAL_TABLET | ORAL | Status: DC

## 2014-08-22 MED ORDER — METHYLPREDNISOLONE ACETATE 80 MG/ML IJ SUSP
120.0000 mg | Freq: Once | INTRAMUSCULAR | Status: AC
  Administered 2014-08-22: 120 mg via INTRAMUSCULAR

## 2014-08-22 MED ORDER — ALBUTEROL SULFATE (2.5 MG/3ML) 0.083% IN NEBU
2.5000 mg | INHALATION_SOLUTION | RESPIRATORY_TRACT | Status: AC
  Administered 2014-08-22: 2.5 mg via RESPIRATORY_TRACT

## 2014-08-22 NOTE — Progress Notes (Signed)
   Subjective:    [name], female    DOB: 03-10-1946, 68 y.o.   MRN: [id number]  HPI Here for 2 days of increased wheezing, SOB, and a dry cough. No chest pain or fever. Using her Qvar and albuterol inhalers. Using Mucinex.    Review of Systems  Constitutional: Negative.   HENT: Negative.   Eyes: Negative.   Respiratory: Positive for cough, chest tightness, shortness of breath and wheezing.   Cardiovascular: Negative.        Objective:   Physical Exam  Constitutional:  Mildly SOB, she has trouble talking, audible wheezing  HENT:  Right Ear: External ear normal.  Left Ear: External ear normal.  Nose: Nose normal.  Mouth/Throat: Oropharynx is clear and moist.  Eyes: Conjunctivae are normal.  Cardiovascular: Normal rate, regular rhythm, normal heart sounds and intact distal pulses.   Pulmonary/Chest: Effort normal. She has no rales.  Scattered rhonchi and wheezes   Lymphadenopathy:    She has no cervical adenopathy.          Assessment & Plan:  Given an albuterol nebulization and a steroid shot. Given a Zpack.

## 2014-08-22 NOTE — Telephone Encounter (Signed)
Patient Information:  Caller Name: Tye Maryland  Phone: [phone]  Patient: Susan, Collins  Gender: Female  DOB: 23-Oct-1945  Age: 67 Years  PCP: Bluford Kaufmann (Family Practice > 81yrs old)  Office Follow Up:  Does the office need to follow up with this patient?: No  Instructions For The Office: N/A  RN Note: Unable to speak in complete sentences.  Chronic shortness of breath with multiple calls since last office visit 08/08/14.  Shortness of breath present since got up this morning with wheezing.  Chest feels tight. Does not have a peak flow meter.  Last used Albuterol MDI at 0600.  Instructed to use MDI again at 1000.  Has not used Hycodan today.  Ambulatory; cooked breakfast. Hydrate and humidify.  Drink cup of warm water now. Take a shower now for steam to loosen lung mucus.  Advised to see MD now (per MD protocol for ED disposition when office is open) for pt sounds weak to triager per Asthma.  Symptoms  Reason For Call & Symptoms: Trasnferred from [place]/office for shortness of breath; history of asthma.  Consult with Dr Sharlot Gowda, asthma specialist, was rescheduled for 08/28/14  Reviewed Health History In EMR: Yes  Reviewed Medications In EMR: Yes  Reviewed Allergies In EMR: Yes  Reviewed Surgeries / Procedures: Yes  Date of Onset of Symptoms: 08/08/2014  Treatments Tried: Albuterol, Qvar  Treatments Tried Worked: No  Guideline(s) Used:  Breathing Difficulty  Asthma Attack  Disposition Per Guideline:   Go to ED Now (or to Office with PCP Approval)  Reason For Disposition Reached:   Patient sounds very sick or weak to the triager  Advice Given:  Quick-Relief Asthma Medicine:   Start your quick-relief medicine (e.g., albuterol, salbutamol) at the first sign of any coughing or shortness of breath (don't wait for wheezing). Use your inhaler (2 puffs each time) or nebulizer every 4 hours. Continue the quick-relief medicine until you have not wheezed or coughed for 48 hours.  The  best "cough medicine" for an adult with asthma is always the asthma medicine (Note: Don't use cough suppressants, but cough drops may help a tickly cough).  Long-Term-Control Asthma Medicine:  If you are using a controller medicine (e.g., inhaled steroids or cromolyn), continue to take it as directed.  Drinking Liquids:  Try to drink normal amount of liquids (e.g., water). Being adequately hydrated makes it easier to cough up the sticky lung mucus.  Humidifier:   If the air is dry, use a cool mist humidifier to prevent drying of the upper airway.  Hay Fever  : If you have nasal symptoms from hay fever, it's OK to take antihistamines (Reasons: poor control of allergic rhinitis makes asthma worse whereas antihistamines don't make asthma worse).  Expected Course:  If treatment is started early, most asthma attacks are quickly brought under control. All wheezing should be gone by 5 days.  Call Back If:  Inhaled asthma medicine (nebulizer or inhaler) is needed more often than every 4 hours  Wheezing has not completely cleared after 5 days  You become worse.  Patient Will Follow Care Advice:  YES  Appointment Scheduled:  08/22/2014 10:45:00 Appointment Scheduled Provider:  Garret Reddish

## 2014-08-22 NOTE — Progress Notes (Signed)
Pre visit review using our clinic review tool, if applicable. No additional management support is needed unless otherwise documented below in the visit note. 

## 2014-08-23 ENCOUNTER — Institutional Professional Consult (permissible substitution): Payer: Medicare HMO | Admitting: Internal Medicine

## 2014-08-28 ENCOUNTER — Institutional Professional Consult (permissible substitution): Payer: Medicare HMO | Admitting: Internal Medicine

## 2014-10-24 ENCOUNTER — Telehealth: Payer: Self-pay | Admitting: Internal Medicine

## 2014-10-24 MED ORDER — HYDROCODONE-HOMATROPINE 5-1.5 MG/5ML PO SYRP: 5.0000 mL | ORAL_SOLUTION | Freq: Four times a day (QID) | ORAL | Status: DC | PRN

## 2014-10-24 NOTE — Telephone Encounter (Signed)
Please advise if okay to refill. 

## 2014-10-24 NOTE — Telephone Encounter (Signed)
Pt notified Rx ready for pickup. Rx printed and signed.  

## 2014-10-24 NOTE — Telephone Encounter (Signed)
Ok 6 oz

## 2014-10-24 NOTE — Telephone Encounter (Signed)
Pt cough has return and requesting hydrocodone cough syrup. Please advise

## 2015-01-08 ENCOUNTER — Telehealth: Payer: Self-pay | Admitting: Internal Medicine

## 2015-01-08 DIAGNOSIS — J45909 Unspecified asthma, uncomplicated: Secondary | ICD-10-CM

## 2015-01-08 MED ORDER — TRAMADOL HCL 50 MG PO TABS: ORAL_TABLET | ORAL | Status: DC

## 2015-01-08 NOTE — Telephone Encounter (Signed)
Pt would like a referral to dr young for her asthma issues. If dr young not avail, any dr w/in [place] will do. Pt having a hard time doing much of anything, runs out of breath.

## 2015-01-08 NOTE — Telephone Encounter (Signed)
Okay to do referral to Pulmonary? 

## 2015-01-08 NOTE — Telephone Encounter (Signed)
Pt notified Rx called into pharmacy 

## 2015-01-08 NOTE — Telephone Encounter (Signed)
Okay for referral but return office visit here for more intensive treatment of her asthma a better option

## 2015-01-08 NOTE — Telephone Encounter (Addendum)
Pt request refill traMADol (ULTRAM) 50 MG tablet BUT wants only 90 tabs  [facility]

## 2015-01-08 NOTE — Telephone Encounter (Signed)
Spoke to pt, told her order  for referral to Pulmonary done and someone will contact you regarding an appointment. Pt verbalized understanding.

## 2015-01-26 ENCOUNTER — Institutional Professional Consult (permissible substitution): Payer: Medicare HMO | Admitting: Internal Medicine

## 2015-02-20 ENCOUNTER — Ambulatory Visit (INDEPENDENT_AMBULATORY_CARE_PROVIDER_SITE_OTHER): Payer: Medicare HMO | Admitting: Internal Medicine

## 2015-02-20 ENCOUNTER — Encounter: Payer: Self-pay | Admitting: Internal Medicine

## 2015-02-20 ENCOUNTER — Ambulatory Visit (INDEPENDENT_AMBULATORY_CARE_PROVIDER_SITE_OTHER)
Admission: RE | Admit: 2015-02-20 | Discharge: 2015-02-20 | Disposition: A | Discharge status: Home / Self Care | Payer: Medicare HMO | Source: Ambulatory Visit | Attending: Internal Medicine | Admitting: Internal Medicine

## 2015-02-20 VITALS — BP 130/70 | HR 85 | Temp 97.9°F | Resp 24 | Ht 62.5 in | Wt 148.0 lb

## 2015-02-20 DIAGNOSIS — R0600 Dyspnea, unspecified: Secondary | ICD-10-CM | POA: Diagnosis not present

## 2015-02-20 DIAGNOSIS — R0689 Other abnormalities of breathing: Secondary | ICD-10-CM | POA: Diagnosis not present

## 2015-02-20 DIAGNOSIS — J441 Chronic obstructive pulmonary disease with (acute) exacerbation: Secondary | ICD-10-CM

## 2015-02-20 DIAGNOSIS — E7800 Pure hypercholesterolemia, unspecified: Secondary | ICD-10-CM

## 2015-02-20 MED ORDER — HYDROCODONE-HOMATROPINE 5-1.5 MG/5ML PO SYRP: 5.0000 mL | ORAL_SOLUTION | Freq: Four times a day (QID) | ORAL | Status: DC | PRN

## 2015-02-20 MED ORDER — AZITHROMYCIN 250 MG PO TABS: ORAL_TABLET | ORAL | Status: DC

## 2015-02-20 MED ORDER — PREDNISONE 10 MG PO TABS: ORAL_TABLET | ORAL | Status: DC

## 2015-02-20 NOTE — Progress Notes (Signed)
Pre visit review using our clinic review tool, if applicable. No additional management support is needed unless otherwise documented below in the visit note. 

## 2015-02-20 NOTE — Patient Instructions (Signed)
Take over-the-counter expectorants and cough medications such as  Mucinex DM.  Call if there is no improvement in 5 to 7 days or if  you develop worsening cough, fever, or new symptoms, such as shortness of breath or chest pain.  CXR as discussed  Take your antibiotic as prescribed until ALL of it is gone, but stop if you develop a rash, swelling, or any side effects of the medication.  Contact our office as soon as possible if  there are side effects of the medication.

## 2015-02-20 NOTE — Progress Notes (Signed)
Subjective:    [name], female    DOB: Jun 22, 1946, 69 y.o.   MRN: [id number]  HPI   69 year old patient who has a history of asthma and has been on inhalational medications for some time. Discontinued tobacco products 19 years ago.  Chest x-ray in the past has suggested emphysematous changes.  She was last seen in November of last year and treated for exacerbation of COPD.  She states that she continues to do poorly with wheezing, cough and shortness of breath.  She has a history of dyslipidemia and history of colonic polyps  Past Medical History  Diagnosis Date  . CHEST PAIN 11/20/2008  . COLONIC POLYPS, HX OF 05/24/2007  . DYSPNEA ON EXERTION 12/19/2008  . HYPERCHOLESTEROLEMIA 09/18/2008  . 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 POSTMENOPAUSAL SYNDROME 04/12/2008  . 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 Diverticular disease   . Peripheral neuropathy     History   Social History  . Marital Status: Married    Spouse Name: N/A  . Number of Children: N/A  . Years of Education: N/A   Occupational History  . Not on file.   Social History Main Topics  . Smoking status: Former Smoker    Quit date: 10/13/1996  . Smokeless tobacco: Never Used  . Alcohol Use: No  . Drug Use: No  . Sexual Activity: Not on file   Other Topics Concern  . Not on file   Social History Narrative    Past Surgical History  Procedure Laterality Date  . Abdominal hysterectomy    . Lumbar laminectomy    . Nasal sinus surgery    . Shoulder surgery      Family History  Problem Relation Age of Onset  . Cancer Mother     died of lung Ca  . COPD Father   . Heart disease Father     died of MI  . Cancer Brother     bladder Ca  . COPD Brother     Allergies  Allergen Reactions  . Sulfonamide Derivatives     REACTION: headache    Current Outpatient Prescriptions on File Prior to Visit  Medication Sig Dispense Refill  . albuterol (PROVENTIL HFA;VENTOLIN HFA) 108 (90  BASE) MCG/ACT inhaler Inhale 2 puffs into the lungs every 6 (six) hours as needed. 1 Inhaler 6  . beclomethasone (QVAR) 80 MCG/ACT inhaler Inhale 2 puffs into the lungs daily. 1 Inhaler 12  . HYDROcodone-homatropine (HYCODAN) 5-1.5 MG/5ML syrup Take 5 mLs by mouth every 6 (six) hours as needed for cough. 120 mL 0  . simvastatin (ZOCOR) 40 MG tablet TAKE ONE TABLET BY MOUTH ONCE DAILY 90 tablet 3  . traMADol (ULTRAM) 50 MG tablet TAKE ONE TABLET BY MOUTH TWICE DAILY AS NEEDED FOR PAIN 90 tablet 2   No current facility-administered medications on file prior to visit.    BP 130/70 mmHg  Pulse 85  Temp(Src) 97.9 F (36.6 C) (Oral)  Resp 24  Ht 5' 2.5" (1.588 m)  Wt 148 lb (67.132 kg)  BMI 26.62 kg/m2  SpO2 93%     Review of Systems  Constitutional: Positive for activity change, appetite change and fatigue.  HENT: Negative for congestion, dental problem, hearing loss, rhinorrhea, sinus pressure, sore throat and tinnitus.   Eyes: Negative for pain, discharge and visual disturbance.  Respiratory: Positive for cough, shortness of breath and wheezing.   Cardiovascular: Negative for chest pain, palpitations and leg swelling.  Gastrointestinal: Negative  for nausea, vomiting, abdominal pain, diarrhea, constipation, blood in stool and abdominal distention.  Genitourinary: Negative for dysuria, urgency, frequency, hematuria, flank pain, vaginal bleeding, vaginal discharge, difficulty urinating, vaginal pain and pelvic pain.  Musculoskeletal: Negative for joint swelling, arthralgias and gait problem.  Skin: Negative for rash.  Neurological: Negative for dizziness, syncope, speech difficulty, weakness, numbness and headaches.  Hematological: Negative for adenopathy.  Psychiatric/Behavioral: Negative for behavioral problems, dysphoric mood and agitation. The patient is not nervous/anxious.        Objective:   Physical Exam  Constitutional: She is oriented to person, place, and time. She  appears well-developed and well-nourished.  HENT:  Head: Normocephalic.  Right Ear: External ear normal.  Left Ear: External ear normal.  Mouth/Throat: Oropharynx is clear and moist.  Eyes: Conjunctivae and EOM are normal. Pupils are equal, round, and reactive to light.  Neck: Normal range of motion. Neck supple. No thyromegaly present.  Cardiovascular: Normal rate, regular rhythm, normal heart sounds and intact distal pulses.   Pulmonary/Chest: Effort normal.  Coarse diffuse rhonchi, most marked in expiration No active wheezing O2 saturation 95 Pulse rate 80  Abdominal: Soft. Bowel sounds are normal. She exhibits no mass. There is no tenderness.  Musculoskeletal: Normal range of motion.  Lymphadenopathy:    She has no cervical adenopathy.  Neurological: She is alert and oriented to person, place, and time.  Skin: Skin is warm and dry. No rash noted.  Psychiatric: She has a normal mood and affect. Her behavior is normal.          Assessment & Plan:   Exacerbation COPD.  Will treat with azithromycin expectorants and brief course of steroids.  We'll continue albuterol and maintenance betamethasone Will review chest x-ray Recheck 4 weeks.  History of colonic polyps Dyslipidemia

## 2015-02-28 ENCOUNTER — Telehealth: Payer: Self-pay | Admitting: Internal Medicine

## 2015-02-28 ENCOUNTER — Ambulatory Visit (INDEPENDENT_AMBULATORY_CARE_PROVIDER_SITE_OTHER): Payer: Medicare HMO | Admitting: Internal Medicine

## 2015-02-28 ENCOUNTER — Encounter: Payer: Self-pay | Admitting: Internal Medicine

## 2015-02-28 DIAGNOSIS — G9009 Other idiopathic peripheral autonomic neuropathy: Secondary | ICD-10-CM

## 2015-02-28 DIAGNOSIS — J4540 Moderate persistent asthma, uncomplicated: Secondary | ICD-10-CM | POA: Diagnosis not present

## 2015-02-28 DIAGNOSIS — R0602 Shortness of breath: Secondary | ICD-10-CM

## 2015-02-28 DIAGNOSIS — J441 Chronic obstructive pulmonary disease with (acute) exacerbation: Secondary | ICD-10-CM | POA: Diagnosis not present

## 2015-02-28 MED ORDER — METHYLPREDNISOLONE ACETATE 80 MG/ML IJ SUSP
40.0000 mg | Freq: Once | INTRAMUSCULAR | Status: AC
  Administered 2015-02-28: 40 mg via INTRAMUSCULAR

## 2015-02-28 MED ORDER — FLUTICASONE-SALMETEROL 250-50 MCG/DOSE IN AEPB: 1.0000 | INHALATION_SPRAY | Freq: Two times a day (BID) | RESPIRATORY_TRACT | Status: DC

## 2015-02-28 NOTE — Telephone Encounter (Signed)
Patient Name: Susan Collins  DOB: 02-07-1946    Initial Comment caller states she is having trouble breathing   Nurse Assessment  Nurse: Leilani Merl, RN, Heather Date/Time (Eastern Time): 02/28/2015 9:32:30 AM  Confirm and document reason for call. If symptomatic, describe symptoms. ---caller states that she was seen in the office 8 days ago for diff breathing, she was given medication, but she started with diff breathing again.  Has the patient traveled out of the country within the last 30 days? ---Not Applicable  Does the patient require triage? ---Yes  Related visit to physician within the last 2 weeks? ---Yes  Does the PT have any chronic conditions? (i.e. diabetes, asthma, etc.) ---Yes  List chronic conditions. ---COPD, asthma     Guidelines    Guideline Title Affirmed Question Affirmed Notes  Breathing Difficulty Patient sounds very sick or weak to the triager    Final Disposition User   Go to ED Now (or PCP triage) Leilani Merl, RN, Nira Conn    Comments  Appt made for today at 10:15am with Dr. Burnice Logan.

## 2015-02-28 NOTE — Addendum Note (Signed)
Addended by: Marian Sorrow on: 02/28/2015 11:27 AM   Modules accepted: Orders

## 2015-02-28 NOTE — Progress Notes (Signed)
Pre visit review using our clinic review tool, if applicable. No additional management support is needed unless otherwise documented below in the visit note. 

## 2015-02-28 NOTE — Telephone Encounter (Signed)
Pt said when she go outside is when she has the breathing issues. She said she has been taking allergy relieve [facility] brand

## 2015-02-28 NOTE — Progress Notes (Signed)
Subjective:    [name], female    DOB: [birth year], 69 y.o.   MRN: [id number]  HPI  69 year old patient who has a remote history of tobacco use.  30 years ago.  She was treated by allergy medicine for asthma/allergic rhinitis that included immunotherapy. She was seen 8 days ago complaining of increasing shortness of breath, wheezing and dyspnea.  At that time.  She also had worsening productive cough. She has been on inhalational medications and was placed on low-dose oral prednisone.  She initially improved but over the past 2 days has had increasing shortness of breath especially with exertion.  She has had very little wheezing and her productive cough has improved. A chest x-ray was obtained that revealed no acute changes  Past Medical History  Diagnosis Date  . CHEST PAIN 11/20/2008  . COLONIC POLYPS, HX OF 05/24/2007  . DYSPNEA ON EXERTION 12/19/2008  . HYPERCHOLESTEROLEMIA 09/18/2008  . 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 POSTMENOPAUSAL SYNDROME 04/12/2008  . 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 Diverticular disease   . Peripheral neuropathy     History   Social History  . Marital Status: Married    Spouse Name: N/A  . Number of Children: N/A  . Years of Education: N/A   Occupational History  . Not on file.   Social History Main Topics  . Smoking status: Former Smoker    Quit date: 10/13/1996  . Smokeless tobacco: Never Used  . Alcohol Use: No  . Drug Use: No  . Sexual Activity: Not on file   Other Topics Concern  . Not on file   Social History Narrative    Past Surgical History  Procedure Laterality Date  . Abdominal hysterectomy    . Lumbar laminectomy    . Nasal sinus surgery    . Shoulder surgery      Family History  Problem Relation Age of Onset  . Cancer Mother     died of lung Ca  . COPD Father   . Heart disease Father     died of MI  . Cancer Brother     bladder Ca  . COPD Brother     Allergies  Allergen  Reactions  . Sulfonamide Derivatives     REACTION: headache    Current Outpatient Prescriptions on File Prior to Visit  Medication Sig Dispense Refill  . albuterol (PROVENTIL HFA;VENTOLIN HFA) 108 (90 BASE) MCG/ACT inhaler Inhale 2 puffs into the lungs every 6 (six) hours as needed. 1 Inhaler 6  . beclomethasone (QVAR) 80 MCG/ACT inhaler Inhale 2 puffs into the lungs daily. 1 Inhaler 12  . HYDROcodone-homatropine (HYCODAN) 5-1.5 MG/5ML syrup Take 5 mLs by mouth every 6 (six) hours as needed for cough. 120 mL 0  . predniSONE (DELTASONE) 10 MG tablet 3 tablets twice daily for 2 days, then 2 tablets twice daily for 2 days,  then 1 tablet twice daily for 2 days, then one tablet daily  for 6 days 30 tablet 0  . simvastatin (ZOCOR) 40 MG tablet TAKE ONE TABLET BY MOUTH ONCE DAILY 90 tablet 3  . traMADol (ULTRAM) 50 MG tablet TAKE ONE TABLET BY MOUTH TWICE DAILY AS NEEDED FOR PAIN 90 tablet 2   No current facility-administered medications on file prior to visit.    BP 126/80 mmHg  Pulse 76  Temp(Src) 97.7 F (36.5 C) (Oral)  Resp 24  Ht 5' 2.5" (1.588 m)  Wt 147 lb (66.679 kg)  BMI  26.44 kg/m2  SpO2 96%     Review of Systems  Constitutional: Negative.   HENT: Negative for congestion, dental problem, hearing loss, rhinorrhea, sinus pressure, sore throat and tinnitus.   Eyes: Negative for pain, discharge and visual disturbance.  Respiratory: Positive for cough, shortness of breath and wheezing.   Cardiovascular: Negative for chest pain, palpitations and leg swelling.  Gastrointestinal: Negative for nausea, vomiting, abdominal pain, diarrhea, constipation, blood in stool and abdominal distention.  Genitourinary: Negative for dysuria, urgency, frequency, hematuria, flank pain, vaginal bleeding, vaginal discharge, difficulty urinating, vaginal pain and pelvic pain.  Musculoskeletal: Negative for joint swelling, arthralgias and gait problem.  Skin: Negative for rash.  Neurological:  Negative for dizziness, syncope, speech difficulty, weakness, numbness and headaches.  Hematological: Negative for adenopathy.  Psychiatric/Behavioral: Negative for behavioral problems, dysphoric mood and agitation. The patient is not nervous/anxious.        Objective:   Physical Exam  Constitutional: She is oriented to person, place, and time. She appears well-developed and well-nourished. No distress.  HENT:  Head: Normocephalic.  Right Ear: External ear normal.  Left Ear: External ear normal.  Mouth/Throat: Oropharynx is clear and moist.  Eyes: Conjunctivae and EOM are normal. Pupils are equal, round, and reactive to light.  Neck: Normal range of motion. Neck supple. No thyromegaly present.  Cardiovascular: Normal rate, regular rhythm, normal heart sounds and intact distal pulses.   Pulmonary/Chest: Effort normal. No respiratory distress. She has wheezes.  No tachypnea or tachycardia  Scattered end expiratory wheezes  O2 saturation 96  Abdominal: Soft. Bowel sounds are normal. She exhibits no mass. There is no tenderness.  Musculoskeletal: Normal range of motion.  Lymphadenopathy:    She has no cervical adenopathy.  Neurological: She is alert and oriented to person, place, and time.  Skin: Skin is warm and dry. No rash noted.  Psychiatric: She has a normal mood and affect. Her behavior is normal.          Assessment & Plan:  COPD/asthma.  Will treat with Depo-Medrol 40.  Will discontinue beclomethasone and substitute Advair.  Will set her for pulmonary consultation and consider for a full PFTs We'll check a d-dimer

## 2015-02-28 NOTE — Telephone Encounter (Signed)
Seen by MD Burnice Logan today

## 2015-02-28 NOTE — Patient Instructions (Signed)
Pulmonary consultation as discussed  Continue inhalational medications  Call if you develop any new or worsening symptoms

## 2015-03-01 ENCOUNTER — Ambulatory Visit (INDEPENDENT_AMBULATORY_CARE_PROVIDER_SITE_OTHER): Payer: Medicare HMO | Admitting: Pulmonary Disease

## 2015-03-01 ENCOUNTER — Encounter: Payer: Self-pay | Admitting: Pulmonary Disease

## 2015-03-01 VITALS — BP 132/60 | HR 73 | Ht 62.5 in | Wt 146.0 lb

## 2015-03-01 DIAGNOSIS — R05 Cough: Secondary | ICD-10-CM

## 2015-03-01 DIAGNOSIS — R058 Other specified cough: Secondary | ICD-10-CM

## 2015-03-01 DIAGNOSIS — Z87891 Personal history of nicotine dependence: Secondary | ICD-10-CM

## 2015-03-01 DIAGNOSIS — J45909 Unspecified asthma, uncomplicated: Secondary | ICD-10-CM | POA: Diagnosis not present

## 2015-03-01 DIAGNOSIS — R059 Cough, unspecified: Secondary | ICD-10-CM

## 2015-03-01 LAB — D-DIMER, QUANTITATIVE (NOT AT ARMC): D-Dimer, Quant: 0.41 ug/mL-FEU (ref 0.00–0.48)

## 2015-03-01 MED ORDER — FLUTICASONE PROPIONATE 50 MCG/ACT NA SUSP: 2.0000 | Freq: Every day | NASAL | Status: DC

## 2015-03-01 NOTE — Progress Notes (Signed)
Chief Complaint  Patient presents with  . PULMONARY CONSULT    Referred by Dr Burnice Logan for asthma. Recent cxr.     History of Present Illness: Susan Collins is a 69 y.o. female former smoker evaluation of cough, asthma, and emphysema changes on chest xray.  She has a long history of allergies and asthma.  She used to be on allergy shots with Dr. Caprice Red, but has not had allergy shots for years.  She has not had PFT done for several years either.  She notes that her allergy symptoms are worse in the Spring.  She has most trouble from pollen, dogs and cats.  She also has more trouble in rainy weather.  She developed more of a cough and wheezing earlier this month.  She was also getting clear sputum.  She was changed from Qvar to advair and given prednisone taper.  She has improved from this episode.  She has a few more doses of prednisone left.    Her chest xray from 02/20/15 showed changes of emphysema.  She started smoking in her early 20's, smoked up to 1.5 packs per day, and quit in 1997.  She gets itchy eyes and sinus congestion with runny nose and post nasal drip.  She will then get a cough.  She denies reflux, hemoptysis, skin rash, fever, or gland swelling.    She is from [place].  She used to work as a Scientist, water quality.  She had pneumonia in 1997 after having a hysterectomy.  There is no history of exposure to tuberculosis.  She has been using OTC allergy pill from [facility].  She has also been using mucinex.  Both of these have helped.  She was given script for hydrocodone cough syrup, but she is not needing to use this now.  She had sinus surgery years ago.   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  Susan Collins  has past surgical history that includes Abdominal hysterectomy; Lumbar laminectomy; Nasal sinus surgery; and Shoulder surgery.  Prior to Admission medications   Medication Sig Start Date End Date Taking? Authorizing Provider  albuterol (PROVENTIL HFA;VENTOLIN HFA) 108 (90 BASE) MCG/ACT inhaler Inhale 2 puffs into the lungs every 6 (six) hours as needed. 01/30/14  Yes Marletta Lor, MD  Fluticasone-Salmeterol (ADVAIR) 250-50 MCG/DOSE AEPB Inhale 1 puff into the lungs 2 (two) times daily. 02/28/15  Yes Marletta Lor, MD  HYDROcodone-homatropine [facility]) 5-1.5 MG/5ML syrup Take 5 mLs by mouth every 6 (six) hours as needed for cough. 02/20/15  Yes Marletta Lor, MD  predniSONE (DELTASONE) 10 MG tablet 3 tablets twice daily for 2 days, then 2 tablets twice daily for 2 days,  then 1 tablet twice daily for 2 days, then one tablet daily  for 6 days 02/20/15  Yes Marletta Lor, MD  simvastatin (ZOCOR) 40 MG tablet TAKE ONE TABLET BY MOUTH ONCE DAILY 07/10/14  Yes Marletta Lor, MD  traMADol (ULTRAM) 50 MG tablet TAKE ONE TABLET BY MOUTH TWICE DAILY AS NEEDED FOR PAIN 01/08/15  Yes Marletta Lor, MD    Allergies  Allergen Reactions  . Sulfonamide Derivatives     REACTION: headache    Her family history includes COPD in her brother and father; Cancer in her brother and  mother; Heart disease in her father.  She  reports that she quit smoking about 18 years ago. Her smoking use included Cigarettes. She has a 53 pack-year smoking history. She has never used smokeless tobacco. She reports that she does not drink alcohol or use illicit drugs.  Review of Systems  Constitutional: Negative for fever and unexpected weight change.  HENT: Negative for congestion, dental problem, ear pain, nosebleeds, postnasal drip, rhinorrhea, sinus pressure, sneezing, sore throat and trouble swallowing.   Eyes: Negative for redness and itching.   Respiratory: Positive for shortness of breath. Negative for cough, chest tightness and wheezing.   Cardiovascular: Negative for palpitations and leg swelling.  Gastrointestinal: Negative for nausea and vomiting.  Genitourinary: Negative for dysuria.  Musculoskeletal: Negative for joint swelling.  Skin: Negative for rash.  Neurological: Negative for headaches.  Hematological: Does not bruise/bleed easily.  Psychiatric/Behavioral: Negative for dysphoric mood. The patient is not nervous/anxious.    Physical Exam:  General - No distress ENT - No sinus tenderness, clear nasal discharge, no oral exudate, no LAN, no thyromegaly, TM clear, pupils equal/reactive, raspy voice, wears dentures Cardiac - s1s2 regular, no murmur, pulses symmetric Chest - No wheeze/rales/dullness, good air entry, normal respiratory excursion Back - No focal tenderness Abd - Soft, non-tender, no organomegaly, + bowel sounds Ext - No edema Neuro - Normal strength, cranial nerves intact Skin - No rashes Psych - Normal mood, and behavior   Dg Chest 2 View  02/20/2015   CLINICAL DATA:  Cough and congestion.  EXAM: CHEST  2 VIEW  COMPARISON:  None.  FINDINGS: Mediastinum and hilar structures normal. Lungs are clear. Heart size normal. Lungs are clear. No pleural effusion or pneumothorax. Biapical pleural parenchymal thickening noted consistent with scarring. No acute bony abnormality.  IMPRESSION: No acute cardiopulmonary disease. COPD. Pleural parenchymal scarring .   Electronically Signed   By: Marcello Moores  Register   On: 02/20/2015 16:07    Lab Results  Component Value Date   WBC 7.1 08/08/2014   HGB 12.7 08/08/2014   HCT 38.7 08/08/2014   MCV 91.7 08/08/2014   PLT 227.0 08/08/2014    Lab Results  Component Value Date   CREATININE 0.8 08/08/2014   BUN 11 08/08/2014   NA 139 08/08/2014   K 4.1 08/08/2014   CL 104 08/08/2014   CO2 27 08/08/2014    Lab Results  Component Value Date   ALT 14 08/08/2014   AST  18 08/08/2014   ALKPHOS 65 08/08/2014   BILITOT 1.1 08/08/2014    Lab Results  Component Value Date   TSH 1.05 08/08/2014   Discussion: She has long history of asthma and allergies.  She also has history of tobacco use and recent chest xray showed changes suggestive of emphysema.  She was recently treated for asthmatic bronchitis and cough.  There is concern whether she has asthma +/- COPD.  Assessment/Plan:  Recent asthma exacerbation >> improving. Plan: - complete course of prednisone from PCP - continue advair for now - prn albuterol  Hx of tobacco abuse with changes of emphysema on CXR. Plan: - will arrange for PFT's to further assess presence of COPD  Upper airway cough with allergic rhinitis. Plan: - will have her try nasal irrigation and flonase - she might need additional allergy testing - she can continue OTC antihistamine pill as needed - she can continue mucinex prn - she might benefit from leukotriene inhibitor therapy if her symptoms do not improve further   [employer],  MD [facility] Pager:  [phone]

## 2015-03-01 NOTE — Progress Notes (Deleted)
   Subjective:    [name], female    DOB: [birth year], 69 y.o.   MRN: [id number]  HPI    Review of Systems  Constitutional: Negative for fever and unexpected weight change.  HENT: Negative for congestion, dental problem, ear pain, nosebleeds, postnasal drip, rhinorrhea, sinus pressure, sneezing, sore throat and trouble swallowing.   Eyes: Negative for redness and itching.  Respiratory: Positive for shortness of breath. Negative for cough, chest tightness and wheezing.   Cardiovascular: Negative for palpitations and leg swelling.  Gastrointestinal: Negative for nausea and vomiting.  Genitourinary: Negative for dysuria.  Musculoskeletal: Negative for joint swelling.  Skin: Negative for rash.  Neurological: Negative for headaches.  Hematological: Does not bruise/bleed easily.  Psychiatric/Behavioral: Negative for dysphoric mood. The patient is not nervous/anxious.        Objective:   Physical Exam        Assessment & Plan:

## 2015-03-01 NOTE — Telephone Encounter (Signed)
Spoke to pt, told her to Continue present regimen, you will need to avoid exposure to allergens as much as possible; she will certainly have breakthrough symptoms with the present high pollen count and follow up with Pulmonary/allergy as scheduled. Pt verbalized understanding and stated she has an appointment today with Pulmonary. Told pt okay good and also may take an over-the-counter antihistamine such as Claritin or Allegra but avoidance is the most important factor per Dr. Raliegh Ip. Pt verbalized understanding.

## 2015-03-01 NOTE — Patient Instructions (Addendum)
Nasal irrigation (saline nasal spray) daily  Flonase two sprays each nostril daily Continue advair one puff twice per day and rinse mouth after each use Albuterol two puffs every 6 hours as needed for cough, wheeze or chest congestion Finish prednisone from Dr. Burnice Logan Will schedule pulmonary function test Follow up in 3 weeks with Dr. Halford Chessman or Tammy Parrett

## 2015-03-01 NOTE — Telephone Encounter (Signed)
Please see message and advise 

## 2015-03-01 NOTE — Telephone Encounter (Signed)
Continue present regimen Patient will need to avoid exposure to allergens as much as possible; she will certainly have breakthrough symptoms with the present high pollen count Pulmonary/allergy follow-up as scheduled  May take an over-the-counter antihistamine such as Claritin or Allegra but avoidance is the most important factor

## 2015-03-21 ENCOUNTER — Ambulatory Visit (HOSPITAL_COMMUNITY)
Admission: RE | Admit: 2015-03-21 | Discharge: 2015-03-21 | Disposition: A | Discharge status: Home / Self Care | Payer: Medicare HMO | Source: Ambulatory Visit | Attending: Pulmonary Disease | Admitting: Pulmonary Disease

## 2015-03-21 ENCOUNTER — Ambulatory Visit (INDEPENDENT_AMBULATORY_CARE_PROVIDER_SITE_OTHER): Payer: Medicare HMO | Admitting: Adult Health

## 2015-03-21 ENCOUNTER — Encounter: Payer: Self-pay | Admitting: Adult Health

## 2015-03-21 DIAGNOSIS — J449 Chronic obstructive pulmonary disease, unspecified: Secondary | ICD-10-CM

## 2015-03-21 DIAGNOSIS — R05 Cough: Secondary | ICD-10-CM | POA: Diagnosis not present

## 2015-03-21 DIAGNOSIS — R059 Cough, unspecified: Secondary | ICD-10-CM

## 2015-03-21 LAB — PULMONARY FUNCTION TEST
DL/VA % pred: 93 %
DL/VA: 4.4 ml/min/mmHg/L
DLCO UNC: 12.06 ml/min/mmHg
DLCO unc % pred: 52 %
FEF 25-75 Post: 1.07 L/sec
FEF 25-75 Pre: 0.81 L/sec
FEF2575-%Change-Post: 31 %
FEF2575-%PRED-POST: 58 %
FEF2575-%Pred-Pre: 44 %
FEV1-%Change-Post: 10 %
FEV1-%PRED-PRE: 60 %
FEV1-%Pred-Post: 66 %
FEV1-Post: 1.43 L
FEV1-Pre: 1.29 L
FEV1FVC-%Change-Post: -2 %
FEV1FVC-%PRED-PRE: 88 %
FEV6-%CHANGE-POST: 13 %
FEV6-%PRED-PRE: 70 %
FEV6-%Pred-Post: 79 %
FEV6-POST: 2.17 L
FEV6-PRE: 1.91 L
FEV6FVC-%Change-Post: 0 %
FEV6FVC-%PRED-POST: 104 %
FEV6FVC-%Pred-Pre: 105 %
FVC-%CHANGE-POST: 13 %
FVC-%PRED-POST: 76 %
FVC-%Pred-Pre: 67 %
FVC-Post: 2.18 L
FVC-Pre: 1.92 L
PRE FEV1/FVC RATIO: 67 %
Post FEV1/FVC ratio: 66 %
Post FEV6/FVC ratio: 100 %
Pre FEV6/FVC Ratio: 100 %
RV % PRED: 178 %
RV: 3.84 L
TLC % pred: 113 %
TLC: 5.56 L

## 2015-03-21 MED ORDER — ALBUTEROL SULFATE (2.5 MG/3ML) 0.083% IN NEBU
2.5000 mg | INHALATION_SOLUTION | Freq: Once | RESPIRATORY_TRACT | Status: AC
  Administered 2015-03-21: 2.5 mg via RESPIRATORY_TRACT

## 2015-03-21 NOTE — Patient Instructions (Signed)
Stop QVAR .  Continue on Advair 1 puff Twice daily  , rinse after use.  Begin Spiriva 2 puff daily  follow up Dr. Halford Chessman  In 2 months and As needed   Please contact office for sooner follow up if symptoms do not improve or worsen or seek emergency care

## 2015-03-23 ENCOUNTER — Ambulatory Visit (INDEPENDENT_AMBULATORY_CARE_PROVIDER_SITE_OTHER): Payer: Medicare HMO | Admitting: Internal Medicine

## 2015-03-23 ENCOUNTER — Encounter: Payer: Self-pay | Admitting: Internal Medicine

## 2015-03-23 VITALS — BP 110/68 | HR 68 | Temp 98.2°F | Wt 150.0 lb

## 2015-03-23 DIAGNOSIS — J449 Chronic obstructive pulmonary disease, unspecified: Secondary | ICD-10-CM | POA: Insufficient documentation

## 2015-03-23 DIAGNOSIS — G9009 Other idiopathic peripheral autonomic neuropathy: Secondary | ICD-10-CM

## 2015-03-23 DIAGNOSIS — E7800 Pure hypercholesterolemia, unspecified: Secondary | ICD-10-CM

## 2015-03-23 NOTE — Patient Instructions (Signed)
It is important that you exercise regularly, at least 20 minutes 3 to 4 times per week.  If you develop chest pain or shortness of breath seek  medical attention.    Return in 5 months for your annual exam

## 2015-03-23 NOTE — Progress Notes (Signed)
Subjective:    [name], female    DOB: 1946/02/29, 69 y.o.   MRN: [id number]  HPI  69 year old patient who is seen today for follow-up.  She has COPD with asthma in has done that quite well.  She has had full motor function studies that revealed nice improvement with bronchodilators.  She is quite pleased with her progress. She does have a history of dyslipidemia. She is scheduled for pulmonary follow-up in approximately 2 months  Past Medical History  Diagnosis Date  . CHEST PAIN 11/20/2008  . COLONIC POLYPS, HX OF 05/24/2007  . DYSPNEA ON EXERTION 12/19/2008  . HYPERCHOLESTEROLEMIA 09/18/2008  . 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 POSTMENOPAUSAL SYNDROME 04/12/2008  . 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 Diverticular disease   . Peripheral neuropathy     History   Social History  . Marital Status: Married    Spouse Name: N/A  . Number of Children: N/A  . Years of Education: N/A   Occupational History  . retired    Social History Main Topics  . Smoking status: Former Smoker -- 1.00 packs/day for 53 years    Types: Cigarettes    Quit date: 10/13/1996  . Smokeless tobacco: Never Used     Comment: smoked 1-1.5 PPD   . Alcohol Use: No  . Drug Use: No  . Sexual Activity: Not on file   Other Topics Concern  . Not on file   Social History Narrative    Past Surgical History  Procedure Laterality Date  . Abdominal hysterectomy    . Lumbar laminectomy    . Nasal sinus surgery    . Shoulder surgery      Family History  Problem Relation Age of Onset  . Cancer Mother     died of lung Ca  . COPD Father   . Heart disease Father     died of MI  . Cancer Brother     bladder Ca  . COPD Brother     Allergies  Allergen Reactions  . Sulfonamide Derivatives     REACTION: headache    Current Outpatient Prescriptions on File Prior to Visit  Medication Sig Dispense Refill  . albuterol (PROVENTIL HFA;VENTOLIN HFA) 108 (90 BASE) MCG/ACT  inhaler Inhale 2 puffs into the lungs every 6 (six) hours as needed. 1 Inhaler 6  . simvastatin (ZOCOR) 40 MG tablet TAKE ONE TABLET BY MOUTH ONCE DAILY 90 tablet 3  . traMADol (ULTRAM) 50 MG tablet TAKE ONE TABLET BY MOUTH TWICE DAILY AS NEEDED FOR PAIN 90 tablet 2   No current facility-administered medications on file prior to visit.    BP 110/68 mmHg  Pulse 68  Temp(Src) 98.2 F (36.8 C) (Oral)  Wt 150 lb (68.04 kg)  SpO2 97%     Review of Systems  Constitutional: Negative.   HENT: Negative for congestion, dental problem, hearing loss, rhinorrhea, sinus pressure, sore throat and tinnitus.   Eyes: Negative for pain, discharge and visual disturbance.  Respiratory: Negative for cough and shortness of breath.   Cardiovascular: Negative for chest pain, palpitations and leg swelling.  Gastrointestinal: Negative for nausea, vomiting, abdominal pain, diarrhea, constipation, blood in stool and abdominal distention.  Genitourinary: Negative for dysuria, urgency, frequency, hematuria, flank pain, vaginal bleeding, vaginal discharge, difficulty urinating, vaginal pain and pelvic pain.  Musculoskeletal: Negative for joint swelling, arthralgias and gait problem.  Skin: Negative for rash.  Neurological: Negative for dizziness, syncope, speech difficulty, weakness, numbness and  headaches.  Hematological: Negative for adenopathy.  Psychiatric/Behavioral: Negative for behavioral problems, dysphoric mood and agitation. The patient is not nervous/anxious.        Objective:   Physical Exam  Constitutional: She is oriented to person, place, and time. She appears well-developed and well-nourished.  HENT:  Head: Normocephalic.  Right Ear: External ear normal.  Left Ear: External ear normal.  Mouth/Throat: Oropharynx is clear and moist.  Eyes: Conjunctivae and EOM are normal. Pupils are equal, round, and reactive to light.  Neck: Normal range of motion. Neck supple. No thyromegaly present.    Cardiovascular: Normal rate, regular rhythm, normal heart sounds and intact distal pulses.   Pulmonary/Chest: Effort normal and breath sounds normal. No respiratory distress. She has no wheezes. She has no rales.  Abdominal: Soft. Bowel sounds are normal. She exhibits no mass. There is no tenderness.  Musculoskeletal: Normal range of motion.  Lymphadenopathy:    She has no cervical adenopathy.  Neurological: She is alert and oriented to person, place, and time.  Skin: Skin is warm and dry. No rash noted.  Psychiatric: She has a normal mood and affect. Her behavior is normal.          Assessment & Plan:   COPD with asthma.  Clinically stable.  Nice clinical response to present medical regimen Dyslipidemia.  Patient expresses desire to stop statin therapy if possible.  Patient asked to discontinue simvastatin one month prior to her annual exam in November.  Will reassess lipid profile at that time

## 2015-03-23 NOTE — Progress Notes (Signed)
Pre visit review using our clinic review tool, if applicable. No additional management support is needed unless otherwise documented below in the visit note. 

## 2015-03-26 NOTE — Assessment & Plan Note (Signed)
COPD with suspected asthma PFT does show decreased mid flows with some reversibility  Plan  Stop QVAR .  Continue on Advair 1 puff Twice daily  , rinse after use.  Begin Spiriva 2 puff daily  follow up Dr. Halford Chessman  In 2 months and As needed   Please contact office for sooner follow up if symptoms do not improve or worsen or seek emergency care

## 2015-03-26 NOTE — Progress Notes (Signed)
   Subjective:    [name], female    DOB: 02-29-1946, 69 y.o.   MRN: [id number]  HPI 69 yo former smoker evaluation of cough, asthma, and emphysema changes on chest xray.   03/21/15 Follow up : COPD with asthma  Patient returns for a three-week follow-up Patient was seen 3 weeks ago for pulmonary consult for asthma and cough Patient was set up for a pulmonary function test today FEV1 was 2.15 L, 60%, ratio 67, FVC 67%, DLCO 52%. Broncho-dilator response at 10%. Decreased mid flows with reversibility. Patient is currently taken Qvar and Advair. She denies any hemoptysis, chest pain, orthopnea, PND, or increased leg swelling. Chest x-ray last month showed COPD changes Prevnar and Pneumovax are up-to-date Her breathing is doing okay without flare of cough or wheezing. Does get winded from time to time.    Review of Systems Constitutional:   No  weight loss, night sweats,  Fevers, chills, fatigue, or  lassitude.  HEENT:   No headaches,  Difficulty swallowing,  Tooth/dental problems, or  Sore throat,                No sneezing, itching, ear ache, nasal congestion, post nasal drip,   CV:  No chest pain,  Orthopnea, PND, swelling in lower extremities, anasarca, dizziness, palpitations, syncope.   GI  No heartburn, indigestion, abdominal pain, nausea, vomiting, diarrhea, change in bowel habits, loss of appetite, bloody stools.   Resp:   No excess mucus, no productive cough,  No non-productive cough,  No coughing up of blood.  No change in color of mucus.  No wheezing.  No chest wall deformity  Skin: no rash or lesions.  GU: no dysuria, change in color of urine, no urgency or frequency.  No flank pain, no hematuria   MS:  No joint pain or swelling.  No decreased range of motion.  No back pain.  Psych:  No change in mood or affect. No depression or anxiety.  No memory loss.         Objective:   Physical Exam  GEN: A/Ox3; pleasant , NAD, elderly  HEENT:  [place]/AT,   EACs-clear, TMs-wnl, NOSE-clear, THROAT-clear, no lesions, no postnasal drip or exudate noted.   NECK:  Supple w/ fair ROM; no JVD; normal carotid impulses w/o bruits; no thyromegaly or nodules palpated; no lymphadenopathy.  RESP  Clear  P & A; w/o, wheezes/ rales/ or rhonchi.no accessory muscle use, no dullness to percussion  CARD:  RRR, no m/r/g  , no peripheral edema, pulses intact, no cyanosis or clubbing.  GI:   Soft & nt; nml bowel sounds; no organomegaly or masses detected.  Musco: Warm bil, no deformities or joint swelling noted.   Neuro: alert, no focal deficits noted.    Skin: Warm, no lesions or rashes        Assessment & Plan:

## 2015-04-02 ENCOUNTER — Ambulatory Visit (INDEPENDENT_AMBULATORY_CARE_PROVIDER_SITE_OTHER): Payer: Medicare HMO | Admitting: Internal Medicine

## 2015-04-02 ENCOUNTER — Encounter: Payer: Self-pay | Admitting: Internal Medicine

## 2015-04-02 DIAGNOSIS — J4520 Mild intermittent asthma, uncomplicated: Secondary | ICD-10-CM | POA: Diagnosis not present

## 2015-04-02 DIAGNOSIS — M5432 Sciatica, left side: Secondary | ICD-10-CM | POA: Diagnosis not present

## 2015-04-02 MED ORDER — TRAMADOL HCL 50 MG PO TABS: ORAL_TABLET | ORAL | Status: DC

## 2015-04-02 MED ORDER — METHYLPREDNISOLONE ACETATE 80 MG/ML IJ SUSP
80.0000 mg | Freq: Once | INTRAMUSCULAR | Status: AC
  Administered 2015-04-02: 80 mg via INTRAMUSCULAR

## 2015-04-02 NOTE — Progress Notes (Signed)
Subjective:    [name], female    DOB: [birth year], 69 y.o.   MRN: [id number]  HPI 69 year old patient who presents with a three-day history of low back pain with radiation to the left buttock and down the left posterior lateral leg to the left lateral ankle region.  Pain is constant but alleviated by tramadol.  She states that 15 years ago.  She had it.  Right-sided sciatica but nothing recently.  No unusual activities or precipitating factors No motor weakness.  Past Medical History  Diagnosis Date  . CHEST PAIN 11/20/2008  . COLONIC POLYPS, HX OF 05/24/2007  . DYSPNEA ON EXERTION 12/19/2008  . HYPERCHOLESTEROLEMIA 09/18/2008  . 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 POSTMENOPAUSAL SYNDROME 04/12/2008  . 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 Diverticular disease   . Peripheral neuropathy     History   Social History  . Marital Status: Married    Spouse Name: N/A  . Number of Children: N/A  . Years of Education: N/A   Occupational History  . retired    Social History Main Topics  . Smoking status: Former Smoker -- 1.00 packs/day for 53 years    Types: Cigarettes    Quit date: 10/13/1996  . Smokeless tobacco: Never Used     Comment: smoked 1-1.5 PPD   . Alcohol Use: No  . Drug Use: No  . Sexual Activity: Not on file   Other Topics Concern  . Not on file   Social History Narrative    Past Surgical History  Procedure Laterality Date  . Abdominal hysterectomy    . Lumbar laminectomy    . Nasal sinus surgery    . Shoulder surgery      Family History  Problem Relation Age of Onset  . Cancer Mother     died of lung Ca  . COPD Father   . Heart disease Father     died of MI  . Cancer Brother     bladder Ca  . COPD Brother     Allergies  Allergen Reactions  . Sulfonamide Derivatives     REACTION: headache    Current Outpatient Prescriptions on File Prior to Visit  Medication Sig Dispense Refill  . albuterol (PROVENTIL  HFA;VENTOLIN HFA) 108 (90 BASE) MCG/ACT inhaler Inhale 2 puffs into the lungs every 6 (six) hours as needed. 1 Inhaler 6  . simvastatin (ZOCOR) 40 MG tablet TAKE ONE TABLET BY MOUTH ONCE DAILY 90 tablet 3  . Tiotropium Bromide Monohydrate (SPIRIVA RESPIMAT) 2.5 MCG/ACT AERS Inhale 2 puffs into the lungs daily.    . traMADol (ULTRAM) 50 MG tablet TAKE ONE TABLET BY MOUTH TWICE DAILY AS NEEDED FOR PAIN 90 tablet 2  . triamcinolone (NASACORT ALLERGY 24HR) 55 MCG/ACT AERO nasal inhaler Place 2 sprays into the nose daily.     No current facility-administered medications on file prior to visit.    BP 110/56 mmHg  Pulse 81  Temp(Src) 97.9 F (36.6 C) (Oral)  Resp 20  Ht 5' 2.5" (1.588 m)  Wt 150 lb (68.04 kg)  BMI 26.98 kg/m2  SpO2 97%      Review of Systems  Constitutional: Negative.   HENT: Negative for congestion, dental problem, hearing loss, rhinorrhea, sinus pressure, sore throat and tinnitus.   Eyes: Negative for pain, discharge and visual disturbance.  Respiratory: Negative for cough and shortness of breath.   Cardiovascular: Negative for chest pain, palpitations and leg swelling.  Gastrointestinal: Negative for  nausea, vomiting, abdominal pain, diarrhea, constipation, blood in stool and abdominal distention.  Genitourinary: Negative for dysuria, urgency, frequency, hematuria, flank pain, vaginal bleeding, vaginal discharge, difficulty urinating, vaginal pain and pelvic pain.  Musculoskeletal: Positive for back pain. Negative for joint swelling, arthralgias and gait problem.  Skin: Negative for rash.  Neurological: Positive for numbness. Negative for dizziness, syncope, speech difficulty, weakness and headaches.  Hematological: Negative for adenopathy.  Psychiatric/Behavioral: Negative for behavioral problems, dysphoric mood and agitation. The patient is not nervous/anxious.        Objective:   Physical Exam  Constitutional: She appears well-developed and well-nourished. No  distress.  Musculoskeletal:  Straight leg test does tend to aggravate pain in the buttock and left leg area Patellar reflexes brisk and equal Achilles reflexes blunted but symmetrical Normal ankle flexion and extension          Assessment & Plan:   Low back pain with sciatica -seems to be a  L5  radiculopathy.  Will treat with Depo-Medrol.  Continue analgesics.  Will call if there is any clinical worsening or new symptoms, such as weakness

## 2015-04-02 NOTE — Addendum Note (Signed)
Addended by: Marian Sorrow on: 04/02/2015 10:48 AM   Modules accepted: Orders

## 2015-04-02 NOTE — Patient Instructions (Signed)
Call or return to clinic prn if these symptoms worsen or fail to improve as anticipated.  Report any new or worsening symptoms  Sciatica Sciatica is pain, weakness, numbness, or tingling along the path of the sciatic nerve. The nerve starts in the lower back and runs down the back of each leg. The nerve controls the muscles in the lower leg and in the back of the knee, while also providing sensation to the back of the thigh, lower leg, and the sole of your foot. Sciatica is a symptom of another medical condition. For instance, nerve damage or certain conditions, such as a herniated disk or bone spur on the spine, pinch or put pressure on the sciatic nerve. This causes the pain, weakness, or other sensations normally associated with sciatica. Generally, sciatica only affects one side of the body. CAUSES   Herniated or slipped disc.  Degenerative disk disease.  A pain disorder involving the narrow muscle in the buttocks (piriformis syndrome).  Pelvic injury or fracture.  Pregnancy.  Tumor (rare). SYMPTOMS  Symptoms can vary from mild to very severe. The symptoms usually travel from the low back to the buttocks and down the back of the leg. Symptoms can include:  Mild tingling or dull aches in the lower back, leg, or hip.  Numbness in the back of the calf or sole of the foot.  Burning sensations in the lower back, leg, or hip.  Sharp pains in the lower back, leg, or hip.  Leg weakness.  Severe back pain inhibiting movement. These symptoms may get worse with coughing, sneezing, laughing, or prolonged sitting or standing. Also, being overweight may worsen symptoms. DIAGNOSIS  Your caregiver will perform a physical exam to look for common symptoms of sciatica. He or she may ask you to do certain movements or activities that would trigger sciatic nerve pain. Other tests may be performed to find the cause of the sciatica. These may include:  Blood tests.  X-rays.  Imaging tests, such  as an MRI or CT scan. TREATMENT  Treatment is directed at the cause of the sciatic pain. Sometimes, treatment is not necessary and the pain and discomfort goes away on its own. If treatment is needed, your caregiver may suggest:  Over-the-counter medicines to relieve pain.  Prescription medicines, such as anti-inflammatory medicine, muscle relaxants, or narcotics.  Applying heat or ice to the painful area.  Steroid injections to lessen pain, irritation, and inflammation around the nerve.  Reducing activity during periods of pain.  Exercising and stretching to strengthen your abdomen and improve flexibility of your spine. Your caregiver may suggest losing weight if the extra weight makes the back pain worse.  Physical therapy.  Surgery to eliminate what is pressing or pinching the nerve, such as a bone spur or part of a herniated disk. HOME CARE INSTRUCTIONS   Only take over-the-counter or prescription medicines for pain or discomfort as directed by your caregiver.  Apply ice to the affected area for 20 minutes, 3-4 times a day for the first 48-72 hours. Then try heat in the same way.  Exercise, stretch, or perform your usual activities if these do not aggravate your pain.  Attend physical therapy sessions as directed by your caregiver.  Keep all follow-up appointments as directed by your caregiver.  Do not wear high heels or shoes that do not provide proper support.  Check your mattress to see if it is too soft. A firm mattress may lessen your pain and discomfort. [place]  CARE IF:   You lose control of your bowel or bladder (incontinence).  You have increasing weakness in the lower back, pelvis, buttocks, or legs.  You have redness or swelling of your back.  You have a burning sensation when you urinate.  You have pain that gets worse when you lie down or awakens you at night.  Your pain is worse than you have experienced in the past.  Your pain is  lasting longer than 4 weeks.  You are suddenly losing weight without reason. MAKE SURE YOU:  Understand these instructions.  Will watch your condition.  Will get help right away if you are not doing well or get worse. Document Released: 09/23/2001 Document Revised: 03/30/2012 Document Reviewed: 02/08/2012 [facility] Patient Information 2015 [place], [place]. This information is not intended to replace advice given to you by your health care provider. Make sure you discuss any questions you have with your health care provider.

## 2015-04-02 NOTE — Progress Notes (Signed)
Pre visit review using our clinic review tool, if applicable. No additional management support is needed unless otherwise documented below in the visit note. 

## 2015-04-05 ENCOUNTER — Telehealth: Payer: Self-pay | Admitting: Internal Medicine

## 2015-04-05 MED ORDER — HYDROCODONE-ACETAMINOPHEN 5-325 MG PO TABS: 1.0000 | ORAL_TABLET | Freq: Four times a day (QID) | ORAL | Status: DC | PRN

## 2015-04-05 NOTE — Telephone Encounter (Signed)
Spoke to pt, asked her if she is taking the Tramadol for pain? Pt stated yes, but is not helping much. Told pt okay can have a Rx for Hydrocodone but will have to come to the office to pickup. Pt verbalized understanding. Told pt Rx will be at the front desk. Rx printed and signed.

## 2015-04-05 NOTE — Telephone Encounter (Signed)
Pt said she is still having leg and back pain and asked if something can be called in for the pain .    Pharmacy ;  [facility]

## 2015-04-05 NOTE — Telephone Encounter (Signed)
Okay for prescription for Vicodin if pain is not relieved by tramadol

## 2015-04-05 NOTE — Telephone Encounter (Signed)
Please see message and advise 

## 2015-04-24 ENCOUNTER — Telehealth: Payer: Self-pay | Admitting: Internal Medicine

## 2015-04-24 MED ORDER — TIOTROPIUM BROMIDE MONOHYDRATE 2.5 MCG/ACT IN AERS: 2.0000 | INHALATION_SPRAY | Freq: Every day | RESPIRATORY_TRACT | Status: DC

## 2015-04-24 NOTE — Telephone Encounter (Signed)
Pt notified Rx sent to pharmacy

## 2015-04-24 NOTE — Telephone Encounter (Signed)
Pt needs Spiriva Respimat rx. She was given a sample, and now she needs a rx for this medication. Pt uses the [facility] on [address]. Pt would like to be notified when this is completed.

## 2015-04-26 ENCOUNTER — Ambulatory Visit (INDEPENDENT_AMBULATORY_CARE_PROVIDER_SITE_OTHER): Payer: Medicare HMO | Admitting: Internal Medicine

## 2015-04-26 ENCOUNTER — Encounter: Payer: Self-pay | Admitting: Internal Medicine

## 2015-04-26 VITALS — BP 118/68 | HR 77 | Temp 98.4°F | Resp 20 | Ht 62.5 in | Wt 148.0 lb

## 2015-04-26 DIAGNOSIS — J449 Chronic obstructive pulmonary disease, unspecified: Secondary | ICD-10-CM | POA: Diagnosis not present

## 2015-04-26 DIAGNOSIS — E785 Hyperlipidemia, unspecified: Secondary | ICD-10-CM | POA: Diagnosis not present

## 2015-04-26 NOTE — Progress Notes (Signed)
Pre visit review using our clinic review tool, if applicable. No additional management support is needed unless otherwise documented below in the visit note. 

## 2015-04-26 NOTE — Progress Notes (Signed)
Subjective:    [name], female    DOB: [birth year], 69 y.o.   MRN: [id number]  HPI 69 year old patient who is seen today to discuss her medical status after being turned down for additional life insurance due to her history of dyslipidemia and COPD. Her COPD has been quite stable on inhalational medications.  She has had no hospital admissions since an elective hysterectomy 19 years ago  Past Medical History  Diagnosis Date  . CHEST PAIN 11/20/2008  . COLONIC POLYPS, HX OF 05/24/2007  . DYSPNEA ON EXERTION 12/19/2008  . HYPERCHOLESTEROLEMIA 09/18/2008  . 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 POSTMENOPAUSAL SYNDROME 04/12/2008  . 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 Diverticular disease   . Peripheral neuropathy     History   Social History  . Marital Status: Married    Spouse Name: N/A  . Number of Children: N/A  . Years of Education: N/A   Occupational History  . retired    Social History Main Topics  . Smoking status: Former Smoker -- 1.00 packs/day for 53 years    Types: Cigarettes    Quit date: 10/13/1996  . Smokeless tobacco: Never Used     Comment: smoked 1-1.5 PPD   . Alcohol Use: No  . Drug Use: No  . Sexual Activity: Not on file   Other Topics Concern  . Not on file   Social History Narrative    Past Surgical History  Procedure Laterality Date  . Abdominal hysterectomy    . Lumbar laminectomy    . Nasal sinus surgery    . Shoulder surgery      Family History  Problem Relation Age of Onset  . Cancer Mother     died of lung Ca  . COPD Father   . Heart disease Father     died of MI  . Cancer Brother     bladder Ca  . COPD Brother     Allergies  Allergen Reactions  . Sulfonamide Derivatives     REACTION: headache    Current Outpatient Prescriptions on File Prior to Visit  Medication Sig Dispense Refill  . albuterol (PROVENTIL HFA;VENTOLIN HFA) 108 (90 BASE) MCG/ACT inhaler Inhale 2 puffs into the lungs every 6  (six) hours as needed. 1 Inhaler 6  . simvastatin (ZOCOR) 40 MG tablet TAKE ONE TABLET BY MOUTH ONCE DAILY 90 tablet 3  . Tiotropium Bromide Monohydrate (SPIRIVA RESPIMAT) 2.5 MCG/ACT AERS Inhale 2 puffs into the lungs daily. 1 Inhaler 5  . traMADol (ULTRAM) 50 MG tablet TAKE ONE TABLET BY MOUTH TWICE DAILY AS NEEDED FOR PAIN 90 tablet 2  . triamcinolone (NASACORT ALLERGY 24HR) 55 MCG/ACT AERO nasal inhaler Place 2 sprays into the nose daily.     No current facility-administered medications on file prior to visit.    BP 118/68 mmHg  Pulse 77  Temp(Src) 98.4 F (36.9 C) (Oral)  Resp 20  Ht 5' 2.5" (1.588 m)  Wt 148 lb (67.132 kg)  BMI 26.62 kg/m2  SpO2 96%       Review of Systems  Constitutional: Negative.   HENT: Negative for congestion, dental problem, hearing loss, rhinorrhea, sinus pressure, sore throat and tinnitus.   Eyes: Negative for pain, discharge and visual disturbance.  Respiratory: Negative for cough and shortness of breath.   Cardiovascular: Negative for chest pain, palpitations and leg swelling.  Gastrointestinal: Negative for nausea, vomiting, abdominal pain, diarrhea, constipation, blood in stool and abdominal distention.  Genitourinary:  Negative for dysuria, urgency, frequency, hematuria, flank pain, vaginal bleeding, vaginal discharge, difficulty urinating, vaginal pain and pelvic pain.  Musculoskeletal: Negative for joint swelling, arthralgias and gait problem.  Skin: Negative for rash.  Neurological: Negative for dizziness, syncope, speech difficulty, weakness, numbness and headaches.  Hematological: Negative for adenopathy.  Psychiatric/Behavioral: Negative for behavioral problems, dysphoric mood and agitation. The patient is not nervous/anxious.        Objective:   Physical Exam  Constitutional: She appears well-developed and well-nourished. No distress.  Cardiovascular: Normal rate and regular rhythm.   Pulmonary/Chest: Effort normal and breath  sounds normal. No respiratory distress. She has no wheezes. She has no rales.          Assessment & Plan:  COPD.  Mild and well-controlled Dyslipidemia  No change in medical regimen A letter on her behalf.  Dictated Annual CPX as scheduled

## 2015-04-26 NOTE — Patient Instructions (Signed)
Annual exam as scheduled

## 2015-04-30 ENCOUNTER — Ambulatory Visit (INDEPENDENT_AMBULATORY_CARE_PROVIDER_SITE_OTHER): Payer: Medicare HMO | Admitting: Internal Medicine

## 2015-04-30 ENCOUNTER — Encounter: Payer: Self-pay | Admitting: Internal Medicine

## 2015-04-30 DIAGNOSIS — E785 Hyperlipidemia, unspecified: Secondary | ICD-10-CM | POA: Diagnosis not present

## 2015-04-30 DIAGNOSIS — J449 Chronic obstructive pulmonary disease, unspecified: Secondary | ICD-10-CM

## 2015-04-30 DIAGNOSIS — M5432 Sciatica, left side: Secondary | ICD-10-CM | POA: Diagnosis not present

## 2015-04-30 MED ORDER — METHYLPREDNISOLONE ACETATE 80 MG/ML IJ SUSP
80.0000 mg | Freq: Once | INTRAMUSCULAR | Status: AC
  Administered 2015-04-30: 80 mg via INTRAMUSCULAR

## 2015-04-30 NOTE — Progress Notes (Signed)
Pre visit review using our clinic review tool, if applicable. No additional management support is needed unless otherwise documented below in the visit note. 

## 2015-04-30 NOTE — Patient Instructions (Signed)
Sciatica Sciatica is pain, weakness, numbness, or tingling along the path of the sciatic nerve. The nerve starts in the lower back and runs down the back of each leg. The nerve controls the muscles in the lower leg and in the back of the knee, while also providing sensation to the back of the thigh, lower leg, and the sole of your foot. Sciatica is a symptom of another medical condition. For instance, nerve damage or certain conditions, such as a herniated disk or bone spur on the spine, pinch or put pressure on the sciatic nerve. This causes the pain, weakness, or other sensations normally associated with sciatica. Generally, sciatica only affects one side of the body. CAUSES   Herniated or slipped disc.  Degenerative disk disease.  A pain disorder involving the narrow muscle in the buttocks (piriformis syndrome).  Pelvic injury or fracture.  Pregnancy.  Tumor (rare). SYMPTOMS  Symptoms can vary from mild to very severe. The symptoms usually travel from the low back to the buttocks and down the back of the leg. Symptoms can include:  Mild tingling or dull aches in the lower back, leg, or hip.  Numbness in the back of the calf or sole of the foot.  Burning sensations in the lower back, leg, or hip.  Sharp pains in the lower back, leg, or hip.  Leg weakness.  Severe back pain inhibiting movement. These symptoms may get worse with coughing, sneezing, laughing, or prolonged sitting or standing. Also, being overweight may worsen symptoms. DIAGNOSIS  Your caregiver will perform a physical exam to look for common symptoms of sciatica. He or she may ask you to do certain movements or activities that would trigger sciatic nerve pain. Other tests may be performed to find the cause of the sciatica. These may include:  Blood tests.  X-rays.  Imaging tests, such as an MRI or CT scan. TREATMENT  Treatment is directed at the cause of the sciatic pain. Sometimes, treatment is not necessary  and the pain and discomfort goes away on its own. If treatment is needed, your caregiver may suggest:  Over-the-counter medicines to relieve pain.  Prescription medicines, such as anti-inflammatory medicine, muscle relaxants, or narcotics.  Applying heat or ice to the painful area.  Steroid injections to lessen pain, irritation, and inflammation around the nerve.  Reducing activity during periods of pain.  Exercising and stretching to strengthen your abdomen and improve flexibility of your spine. Your caregiver may suggest losing weight if the extra weight makes the back pain worse.  Physical therapy.  Surgery to eliminate what is pressing or pinching the nerve, such as a bone spur or part of a herniated disk. HOME CARE INSTRUCTIONS   Only take over-the-counter or prescription medicines for pain or discomfort as directed by your caregiver.  Apply ice to the affected area for 20 minutes, 3-4 times a day for the first 48-72 hours. Then try heat in the same way.  Exercise, stretch, or perform your usual activities if these do not aggravate your pain.  Attend physical therapy sessions as directed by your caregiver.  Keep all follow-up appointments as directed by your caregiver.  Do not wear high heels or shoes that do not provide proper support.  Check your mattress to see if it is too soft. A firm mattress may lessen your pain and discomfort. SEEK IMMEDIATE MEDICAL CARE IF:   You lose control of your bowel or bladder (incontinence).  You have increasing weakness in the lower back, pelvis, buttocks,   or legs.  You have redness or swelling of your back.  You have a burning sensation when you urinate.  You have pain that gets worse when you lie down or awakens you at night.  Your pain is worse than you have experienced in the past.  Your pain is lasting longer than 4 weeks.  You are suddenly losing weight without reason. MAKE SURE YOU:  Understand these  instructions.  Will watch your condition.  Will get help right away if you are not doing well or get worse. Document Released: 09/23/2001 Document Revised: 03/30/2012 Document Reviewed: 02/08/2012 [employer] Patient Information 2015 [employer]. This information is not intended to replace advice given to you by your health care provider. Make sure you discuss any questions you have with your health care provider.  

## 2015-04-30 NOTE — Progress Notes (Signed)
Subjective:    [name], female    DOB: Oct 29, 1945, 69 y.o.   MRN: [id number]  HPI  69 year old patient who is seen today with recurrence of her lumbar pain and left-sided sciatica.  This started 3 days ago.  She had an episode last month.  Responded well to Depo-Medrol and conservative therapy.  No motor weakness. She describes a midline lumbar pain with radiation to the left groin and down the lateral aspect of the left leg to involve the lateral ankle region.  Pain is described as a burning quality  Past Medical History  Diagnosis Date  . CHEST PAIN 11/20/2008  . COLONIC POLYPS, HX OF 05/24/2007  . DYSPNEA ON EXERTION 12/19/2008  . HYPERCHOLESTEROLEMIA 09/18/2008  . 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 POSTMENOPAUSAL SYNDROME 04/12/2008  . 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 Diverticular disease   . Peripheral neuropathy     History   Social History  . Marital Status: Married    Spouse Name: N/A  . Number of Children: N/A  . Years of Education: N/A   Occupational History  . retired    Social History Main Topics  . Smoking status: Former Smoker -- 1.00 packs/day for 53 years    Types: Cigarettes    Quit date: 10/13/1996  . Smokeless tobacco: Never Used     Comment: smoked 1-1.5 PPD   . Alcohol Use: No  . Drug Use: No  . Sexual Activity: Not on file   Other Topics Concern  . Not on file   Social History Narrative    Past Surgical History  Procedure Laterality Date  . Abdominal hysterectomy    . Lumbar laminectomy    . Nasal sinus surgery    . Shoulder surgery      Family History  Problem Relation Age of Onset  . Cancer Mother     died of lung Ca  . COPD Father   . Heart disease Father     died of MI  . Cancer Brother     bladder Ca  . COPD Brother     Allergies  Allergen Reactions  . Sulfonamide Derivatives     REACTION: headache    Current Outpatient Prescriptions on File Prior to Visit  Medication Sig Dispense  Refill  . albuterol (PROVENTIL HFA;VENTOLIN HFA) 108 (90 BASE) MCG/ACT inhaler Inhale 2 puffs into the lungs every 6 (six) hours as needed. 1 Inhaler 6  . simvastatin (ZOCOR) 40 MG tablet TAKE ONE TABLET BY MOUTH ONCE DAILY 90 tablet 3  . Tiotropium Bromide Monohydrate (SPIRIVA RESPIMAT) 2.5 MCG/ACT AERS Inhale 2 puffs into the lungs daily. 1 Inhaler 5  . traMADol (ULTRAM) 50 MG tablet TAKE ONE TABLET BY MOUTH TWICE DAILY AS NEEDED FOR PAIN 90 tablet 2  . triamcinolone (NASACORT ALLERGY 24HR) 55 MCG/ACT AERO nasal inhaler Place 2 sprays into the nose daily.     No current facility-administered medications on file prior to visit.    BP 120/62 mmHg  Pulse 75  Temp(Src) 98.1 F (36.7 C) (Oral)  Resp 20  Ht 5' 2.5" (1.588 m)  Wt 144 lb (65.318 kg)  BMI 25.90 kg/m2  SpO2 98%     Review of Systems  Constitutional: Negative.   HENT: Negative for congestion, dental problem, hearing loss, rhinorrhea, sinus pressure, sore throat and tinnitus.   Eyes: Negative for pain, discharge and visual disturbance.  Respiratory: Negative for cough and shortness of breath.   Cardiovascular: Negative for  chest pain, palpitations and leg swelling.  Gastrointestinal: Negative for nausea, vomiting, abdominal pain, diarrhea, constipation, blood in stool and abdominal distention.  Genitourinary: Negative for dysuria, urgency, frequency, hematuria, flank pain, vaginal bleeding, vaginal discharge, difficulty urinating, vaginal pain and pelvic pain.  Musculoskeletal: Positive for back pain. Negative for joint swelling, arthralgias and gait problem.  Skin: Negative for rash.  Neurological: Negative for dizziness, syncope, speech difficulty, weakness, numbness and headaches.  Hematological: Negative for adenopathy.  Psychiatric/Behavioral: Negative for behavioral problems, dysphoric mood and agitation. The patient is not nervous/anxious.        Objective:   Physical Exam  Constitutional: She appears  well-developed and well-nourished. No distress.  Musculoskeletal:  Positive straight leg test on the left with reproduction of her discomfort No motor weakness Able to walk on her toes and heels          Assessment & Plan:   Left-sided sciatica.  Will retreat with Depo-Medrol 80.  We'll continue tramadol.  This regimen was quite helpful last month and the patient had a prompt clinical response Will report any prolonged or worsening symptoms

## 2015-05-07 ENCOUNTER — Encounter: Payer: Self-pay | Admitting: Gastroenterology

## 2015-05-30 ENCOUNTER — Encounter: Payer: Self-pay | Admitting: Pulmonary Disease

## 2015-05-30 ENCOUNTER — Ambulatory Visit (INDEPENDENT_AMBULATORY_CARE_PROVIDER_SITE_OTHER): Payer: Medicare HMO | Admitting: Pulmonary Disease

## 2015-05-30 VITALS — BP 122/66 | HR 78 | Temp 98.0°F | Ht 62.5 in | Wt 146.0 lb

## 2015-05-30 DIAGNOSIS — J449 Chronic obstructive pulmonary disease, unspecified: Secondary | ICD-10-CM | POA: Diagnosis not present

## 2015-05-30 NOTE — Progress Notes (Signed)
Chief Complaint  Patient presents with  . Follow-up    pt has no breathing complaints today.     History of Present Illness: Susan Collins is a 69 y.o. female former smoker with COPD/asthma and emphysema.  Her breathing is doing much better.  She gets wheeze if she is out in hot weather and over exerts herself.  Otherwise she has been doing well.  She is not needing to use her rescue inhaler.  She feels spiriva helps.  She denies sinus congestion or post nasal drip.  She gets busy taking care of her grand children.  TESTS: PFT 03/21/15 >> FEV1 1.43 (66%), FEV1% 66, TLC 5.56 (113%), DLCO 52%, + BD   Past medical hx >> HLD, Neuropathy, Diverticulosis  Past surgical hx, Medications, Allergies, Family hx, Social hx all reviewed.   Physical Exam: BP 122/66 mmHg  Pulse 78  Temp(Src) 98 F (36.7 C) (Oral)  Ht 5' 2.5" (1.588 m)  Wt 146 lb (66.225 kg)  BMI 26.26 kg/m2  SpO2 95%  General - No distress ENT - No sinus tenderness, no oral exudate, no LAN Cardiac - s1s2 regular, no murmur Chest - No wheeze/rales/dullness Back - No focal tenderness Abd - Soft, non-tender Ext - No edema Neuro - Normal strength Skin - No rashes Psych - normal mood, and behavior   Assessment/Plan:  COPD with asthma and emphysema. Plan: - continue spiriva and prn albuterol - encouraged her to maintain her activity level  Upper airway cough syndrome with allergic rhinitis. Plan: - prn nasacort   Chesley Mires, MD [facility] Pager:  [phone]

## 2015-05-30 NOTE — Patient Instructions (Signed)
Follow up in 1 year.

## 2015-05-31 ENCOUNTER — Ambulatory Visit: Payer: Medicare HMO | Admitting: Internal Medicine

## 2015-07-11 ENCOUNTER — Other Ambulatory Visit: Payer: Self-pay | Admitting: Internal Medicine

## 2015-07-13 ENCOUNTER — Other Ambulatory Visit: Payer: Self-pay | Admitting: Internal Medicine

## 2015-08-04 LAB — HM MAMMOGRAPHY: HM Mammogram: NEGATIVE

## 2015-08-15 ENCOUNTER — Ambulatory Visit (INDEPENDENT_AMBULATORY_CARE_PROVIDER_SITE_OTHER): Payer: Medicare HMO | Admitting: Internal Medicine

## 2015-08-15 ENCOUNTER — Encounter: Payer: Self-pay | Admitting: Internal Medicine

## 2015-08-15 VITALS — BP 126/64 | HR 67 | Temp 98.0°F | Resp 20 | Ht 62.5 in | Wt 146.0 lb

## 2015-08-15 DIAGNOSIS — G9009 Other idiopathic peripheral autonomic neuropathy: Secondary | ICD-10-CM | POA: Diagnosis not present

## 2015-08-15 DIAGNOSIS — J45909 Unspecified asthma, uncomplicated: Secondary | ICD-10-CM | POA: Diagnosis not present

## 2015-08-15 DIAGNOSIS — E7800 Pure hypercholesterolemia, unspecified: Secondary | ICD-10-CM

## 2015-08-15 DIAGNOSIS — J449 Chronic obstructive pulmonary disease, unspecified: Secondary | ICD-10-CM | POA: Diagnosis not present

## 2015-08-15 NOTE — Progress Notes (Signed)
Subjective:    [name], female    DOB: 03-24-1946, 69 y.o.   MRN: [id number]  HPI 69 year old patient who is seen today for her biannual follow-up.  She has a history of COPD which has been stable.  She is had no recent albuterol rescue use.  She has dyslipidemia and a history of a peripheral neuropathy.  She does state that she has had more difficulty with tolerance, but has had no falls.  She remains on statin therapy. Pulmonary status stable  Past Medical History  Diagnosis Date  . CHEST PAIN 11/20/2008  . COLONIC POLYPS, HX OF 05/24/2007  . DYSPNEA ON EXERTION 12/19/2008  . HYPERCHOLESTEROLEMIA 09/18/2008  . 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 POSTMENOPAUSAL SYNDROME 04/12/2008  . 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 Diverticular disease   . Peripheral neuropathy [facility])     Social History   Social History  . Marital Status: Married    Spouse Name: N/A  . Number of Children: N/A  . Years of Education: N/A   Occupational History  . retired    Social History Main Topics  . Smoking status: Former Smoker -- 1.00 packs/day for 53 years    Types: Cigarettes    Quit date: 10/13/1996  . Smokeless tobacco: Never Used     Comment: smoked 1-1.5 PPD   . Alcohol Use: No  . Drug Use: No  . Sexual Activity: Not on file   Other Topics Concern  . Not on file   Social History Narrative    Past Surgical History  Procedure Laterality Date  . Abdominal hysterectomy    . Lumbar laminectomy    . Nasal sinus surgery    . Shoulder surgery      Family History  Problem Relation Age of Onset  . Cancer Mother     died of lung Ca  . COPD Father   . Heart disease Father     died of MI  . Cancer Brother     bladder Ca  . COPD Brother     Allergies  Allergen Reactions  . Sulfonamide Derivatives     REACTION: headache    Current Outpatient Prescriptions on File Prior to Visit  Medication Sig Dispense Refill  . albuterol (PROVENTIL HFA;VENTOLIN  HFA) 108 (90 BASE) MCG/ACT inhaler Inhale 2 puffs into the lungs every 6 (six) hours as needed. 1 Inhaler 6  . simvastatin (ZOCOR) 40 MG tablet TAKE ONE TABLET BY MOUTH ONCE DAILY 90 tablet 1  . Tiotropium Bromide Monohydrate (SPIRIVA RESPIMAT) 2.5 MCG/ACT AERS Inhale 2 puffs into the lungs daily. (Patient taking differently: Inhale 2 puffs into the lungs daily. ) 1 Inhaler 5  . traMADol (ULTRAM) 50 MG tablet TAKE ONE TABLET BY MOUTH TWICE DAILY AS NEEDED FOR PAIN. 90 tablet 2  . triamcinolone (NASACORT ALLERGY 24HR) 55 MCG/ACT AERO nasal inhaler Place 2 sprays into the nose daily.     No current facility-administered medications on file prior to visit.    BP 126/64 mmHg  Pulse 67  Temp(Src) 98 F (36.7 C) (Oral)  Resp 20  Ht 5' 2.5" (1.588 m)  Wt 146 lb (66.225 kg)  BMI 26.26 kg/m2  SpO2 97%      Review of Systems  Constitutional: Negative.   HENT: Negative for congestion, dental problem, hearing loss, rhinorrhea, sinus pressure, sore throat and tinnitus.   Eyes: Negative for pain, discharge and visual disturbance.  Respiratory: Negative for cough and shortness of breath.  Cardiovascular: Negative for chest pain, palpitations and leg swelling.  Gastrointestinal: Negative for nausea, vomiting, abdominal pain, diarrhea, constipation, blood in stool and abdominal distention.  Genitourinary: Negative for dysuria, urgency, frequency, hematuria, flank pain, vaginal bleeding, vaginal discharge, difficulty urinating, vaginal pain and pelvic pain.  Musculoskeletal: Positive for gait problem. Negative for joint swelling and arthralgias.  Skin: Negative for rash.  Neurological: Negative for dizziness, syncope, speech difficulty, weakness, numbness and headaches.  Hematological: Negative for adenopathy.  Psychiatric/Behavioral: Negative for behavioral problems, dysphoric mood and agitation. The patient is not nervous/anxious.        Objective:   Physical Exam  Constitutional: She is  oriented to person, place, and time. She appears well-developed and well-nourished.  HENT:  Head: Normocephalic.  Right Ear: External ear normal.  Left Ear: External ear normal.  Mouth/Throat: Oropharynx is clear and moist.  Eyes: Conjunctivae and EOM are normal. Pupils are equal, round, and reactive to light.  Neck: Normal range of motion. Neck supple. No thyromegaly present.  Cardiovascular: Normal rate, regular rhythm, normal heart sounds and intact distal pulses.   Pulmonary/Chest: Effort normal and breath sounds normal.  Abdominal: Soft. Bowel sounds are normal. She exhibits no mass. There is no tenderness.  Musculoskeletal: Normal range of motion.  Lymphadenopathy:    She has no cervical adenopathy.  Neurological: She is alert and oriented to person, place, and time.  Skin: Skin is warm and dry. No rash noted.  Psychiatric: She has a normal mood and affect. Her behavior is normal.          Assessment & Plan:   COPD/asthma.  Continue present regimen Dyslipidemia.  Continue statin therapy Mild gait abnormality.  Probably secondary to peripheral neuropathy  CPX 6 months Medications updated

## 2015-08-15 NOTE — Progress Notes (Signed)
Pre visit review using our clinic review tool, if applicable. No additional management support is needed unless otherwise documented below in the visit note. 

## 2015-08-15 NOTE — Patient Instructions (Signed)
Limit your sodium (Salt) intake  Return in 6 months for follow-up  

## 2015-08-16 ENCOUNTER — Encounter: Payer: Self-pay | Admitting: Internal Medicine

## 2015-08-29 ENCOUNTER — Ambulatory Visit: Payer: Medicare HMO | Admitting: Internal Medicine

## 2015-10-18 ENCOUNTER — Other Ambulatory Visit: Payer: Self-pay | Admitting: Internal Medicine

## 2015-11-21 ENCOUNTER — Ambulatory Visit: Payer: Medicare HMO | Admitting: Internal Medicine

## 2015-11-27 ENCOUNTER — Ambulatory Visit (INDEPENDENT_AMBULATORY_CARE_PROVIDER_SITE_OTHER): Payer: Medicare HMO | Admitting: Internal Medicine

## 2015-11-27 ENCOUNTER — Encounter: Payer: Self-pay | Admitting: Internal Medicine

## 2015-11-27 VITALS — BP 110/70 | HR 83 | Temp 99.0°F | Resp 22 | Ht 62.5 in | Wt 149.0 lb

## 2015-11-27 DIAGNOSIS — Z86010 Personal history of colonic polyps: Secondary | ICD-10-CM

## 2015-11-27 DIAGNOSIS — J441 Chronic obstructive pulmonary disease with (acute) exacerbation: Secondary | ICD-10-CM

## 2015-11-27 DIAGNOSIS — G9009 Other idiopathic peripheral autonomic neuropathy: Secondary | ICD-10-CM | POA: Diagnosis not present

## 2015-11-27 MED ORDER — PREDNISONE 20 MG PO TABS: 20.0000 mg | ORAL_TABLET | Freq: Two times a day (BID) | ORAL | Status: DC

## 2015-11-27 MED ORDER — TRIAMCINOLONE ACETONIDE 55 MCG/ACT NA AERO: 2.0000 | INHALATION_SPRAY | Freq: Every day | NASAL | Status: DC

## 2015-11-27 MED ORDER — TIOTROPIUM BROMIDE MONOHYDRATE 2.5 MCG/ACT IN AERS: 2.0000 | INHALATION_SPRAY | Freq: Every day | RESPIRATORY_TRACT | Status: DC

## 2015-11-27 NOTE — Patient Instructions (Signed)
Acute bronchitis symptoms for less than 10 days are generally not helped by antibiotics.  Take over-the-counter expectorants and cough medications such as  Mucinex DM.  Call if there is no improvement in 5 to 7 days or if  you develop worsening cough, fever, or new symptoms, such as shortness of breath or chest pain.   

## 2015-11-27 NOTE — Progress Notes (Signed)
Subjective:    [name], female    DOB: 1946-06-25, 70 y.o.   MRN: [id number]  HPI  70 year old patient who has a history of COPD with asthma.  She has not done well for the past 2 weeks with early morning chest congestion, cough and wheezing.  She uses Spiriva in the morning and albuterol throughout the day. No productive cough  Past Medical History  Diagnosis Date  . CHEST PAIN 11/20/2008  . COLONIC POLYPS, HX OF 05/24/2007  . DYSPNEA ON EXERTION 12/19/2008  . HYPERCHOLESTEROLEMIA 09/18/2008  . 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 POSTMENOPAUSAL SYNDROME 04/12/2008  . 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 Diverticular disease   . Peripheral neuropathy [facility])     Social History   Social History  . Marital Status: Married    Spouse Name: N/A  . Number of Children: N/A  . Years of Education: N/A   Occupational History  . retired    Social History Main Topics  . Smoking status: Former Smoker -- 1.00 packs/day for 53 years    Types: Cigarettes    Quit date: 10/13/1996  . Smokeless tobacco: Never Used     Comment: smoked 1-1.5 PPD   . Alcohol Use: No  . Drug Use: No  . Sexual Activity: Not on file   Other Topics Concern  . Not on file   Social History Narrative    Past Surgical History  Procedure Laterality Date  . Abdominal hysterectomy    . Lumbar laminectomy    . Nasal sinus surgery    . Shoulder surgery      Family History  Problem Relation Age of Onset  . Cancer Mother     died of lung Ca  . COPD Father   . Heart disease Father     died of MI  . Cancer Brother     bladder Ca  . COPD Brother     Allergies  Allergen Reactions  . Sulfonamide Derivatives     REACTION: headache    Current Outpatient Prescriptions on File Prior to Visit  Medication Sig Dispense Refill  . simvastatin (ZOCOR) 40 MG tablet TAKE ONE TABLET BY MOUTH ONCE DAILY 90 tablet 1  . traMADol (ULTRAM) 50 MG tablet TAKE ONE TABLET BY MOUTH TWICE DAILY  AS NEEDED FOR PAIN. 90 tablet 2  . VENTOLIN HFA 108 (90 Base) MCG/ACT inhaler INHALE TWO PUFFS BY MOUTH EVERY 6 HOURS AS NEEDED 18 each 3   No current facility-administered medications on file prior to visit.    BP 110/70 mmHg  Pulse 83  Temp(Src) 99 F (37.2 C) (Oral)  Resp 22  Ht 5' 2.5" (1.588 m)  Wt 149 lb (67.586 kg)  BMI 26.80 kg/m2  SpO2 95%     Review of Systems  Constitutional: Positive for fatigue.  HENT: Negative for congestion, dental problem, hearing loss, rhinorrhea, sinus pressure, sore throat and tinnitus.   Eyes: Negative for pain, discharge and visual disturbance.  Respiratory: Positive for cough and wheezing. Negative for shortness of breath.   Cardiovascular: Negative for chest pain, palpitations and leg swelling.  Gastrointestinal: Negative for nausea, vomiting, abdominal pain, diarrhea, constipation, blood in stool and abdominal distention.  Genitourinary: Negative for dysuria, urgency, frequency, hematuria, flank pain, vaginal bleeding, vaginal discharge, difficulty urinating, vaginal pain and pelvic pain.  Musculoskeletal: Negative for joint swelling, arthralgias and gait problem.  Skin: Negative for rash.  Neurological: Negative for dizziness, syncope, speech difficulty, weakness, numbness and  headaches.  Hematological: Negative for adenopathy.  Psychiatric/Behavioral: Negative for behavioral problems, dysphoric mood and agitation. The patient is not nervous/anxious.        Objective:   Physical Exam  Constitutional: She is oriented to person, place, and time. She appears well-developed and well-nourished.  HENT:  Head: Normocephalic.  Right Ear: External ear normal.  Left Ear: External ear normal.  Mouth/Throat: Oropharynx is clear and moist.  Eyes: Conjunctivae and EOM are normal. Pupils are equal, round, and reactive to light.  Neck: Normal range of motion. Neck supple. No thyromegaly present.  Cardiovascular: Normal rate, regular rhythm,  normal heart sounds and intact distal pulses.   Pulmonary/Chest: Effort normal. No respiratory distress.  Scattered coarse rhonchi and some faint wheezing  Abdominal: Soft. Bowel sounds are normal. She exhibits no mass. There is no tenderness.  Musculoskeletal: Normal range of motion.  Lymphadenopathy:    She has no cervical adenopathy.  Neurological: She is alert and oriented to person, place, and time.  Skin: Skin is warm and dry. No rash noted.  Psychiatric: She has a normal mood and affect. Her behavior is normal.          Assessment & Plan:   COPD exacerbation.  We'll treat with oral prednisone 20 mg twice a day for 5 days.  Will give samples of Advair to take for the next 2 weeks.  Will treat with hydration, rest expectorants Dyslipidemia.  CPX in May as scheduled.  Will check lipid profile at that time

## 2015-11-27 NOTE — Progress Notes (Signed)
Pre visit review using our clinic review tool, if applicable. No additional management support is needed unless otherwise documented below in the visit note. 

## 2015-12-03 ENCOUNTER — Other Ambulatory Visit: Payer: Self-pay | Admitting: Internal Medicine

## 2015-12-03 MED ORDER — TIOTROPIUM BROMIDE MONOHYDRATE 18 MCG IN CAPS: 18.0000 ug | ORAL_CAPSULE | Freq: Every day | RESPIRATORY_TRACT | Status: DC

## 2015-12-03 NOTE — Telephone Encounter (Signed)
Pt would like spiriva in pill form and will be cheaper. [facility] [address]

## 2015-12-03 NOTE — Telephone Encounter (Signed)
Pt notified Rx changed to capsules and sent to pharmacy. Pt verbalized understanding.

## 2015-12-03 NOTE — Telephone Encounter (Signed)
Okay to change Spiriva to pill form for pt due to cost?

## 2015-12-03 NOTE — Telephone Encounter (Signed)
ok 

## 2016-01-16 ENCOUNTER — Other Ambulatory Visit: Payer: Self-pay | Admitting: Internal Medicine

## 2016-02-12 ENCOUNTER — Encounter: Payer: Self-pay | Admitting: Internal Medicine

## 2016-02-12 ENCOUNTER — Ambulatory Visit (INDEPENDENT_AMBULATORY_CARE_PROVIDER_SITE_OTHER): Payer: Medicare HMO | Admitting: Internal Medicine

## 2016-02-12 VITALS — BP 146/70 | HR 75 | Temp 98.4°F | Resp 20 | Ht 61.5 in | Wt 146.0 lb

## 2016-02-12 DIAGNOSIS — G9009 Other idiopathic peripheral autonomic neuropathy: Secondary | ICD-10-CM | POA: Diagnosis not present

## 2016-02-12 DIAGNOSIS — J45909 Unspecified asthma, uncomplicated: Secondary | ICD-10-CM

## 2016-02-12 DIAGNOSIS — Z86010 Personal history of colonic polyps: Secondary | ICD-10-CM | POA: Diagnosis not present

## 2016-02-12 DIAGNOSIS — J449 Chronic obstructive pulmonary disease, unspecified: Secondary | ICD-10-CM

## 2016-02-12 DIAGNOSIS — E7800 Pure hypercholesterolemia, unspecified: Secondary | ICD-10-CM | POA: Diagnosis not present

## 2016-02-12 DIAGNOSIS — Z0000 Encounter for general adult medical examination without abnormal findings: Secondary | ICD-10-CM

## 2016-02-12 DIAGNOSIS — E785 Hyperlipidemia, unspecified: Secondary | ICD-10-CM

## 2016-02-12 LAB — CBC WITH DIFFERENTIAL/PLATELET
BASOS ABS: 0 10*3/uL (ref 0.0–0.1)
Basophils Relative: 0.6 % (ref 0.0–3.0)
EOS ABS: 0.3 10*3/uL (ref 0.0–0.7)
EOS PCT: 4.3 % (ref 0.0–5.0)
HCT: 36.9 % (ref 36.0–46.0)
HEMOGLOBIN: 12.3 g/dL (ref 12.0–15.0)
Lymphocytes Relative: 31.6 % (ref 12.0–46.0)
Lymphs Abs: 2.2 10*3/uL (ref 0.7–4.0)
MCHC: 33.2 g/dL (ref 30.0–36.0)
MCV: 89 fl (ref 78.0–100.0)
MONO ABS: 0.6 10*3/uL (ref 0.1–1.0)
Monocytes Relative: 8 % (ref 3.0–12.0)
Neutro Abs: 3.8 10*3/uL (ref 1.4–7.7)
Neutrophils Relative %: 55.5 % (ref 43.0–77.0)
Platelets: 222 10*3/uL (ref 150.0–400.0)
RBC: 4.15 Mil/uL (ref 3.87–5.11)
RDW: 14.1 % (ref 11.5–15.5)
WBC: 6.9 10*3/uL (ref 4.0–10.5)

## 2016-02-12 LAB — LIPID PANEL
CHOLESTEROL: 172 mg/dL (ref 0–200)
HDL: 54.9 mg/dL (ref >39.00)
LDL Cholesterol: 86 mg/dL (ref 0–99)
NonHDL: 117.07
Total CHOL/HDL Ratio: 3
Triglycerides: 153 mg/dL — ABNORMAL HIGH (ref 0.0–149.0)
VLDL: 30.6 mg/dL (ref 0.0–40.0)

## 2016-02-12 LAB — COMPREHENSIVE METABOLIC PANEL
ALBUMIN: 4.1 g/dL (ref 3.5–5.2)
ALK PHOS: 70 U/L (ref 39–117)
ALT: 14 U/L (ref 0–35)
AST: 18 U/L (ref 0–37)
BILIRUBIN TOTAL: 0.9 mg/dL (ref 0.2–1.2)
BUN: 12 mg/dL (ref 6–23)
CO2: 31 mEq/L (ref 19–32)
CREATININE: 0.8 mg/dL (ref 0.40–1.20)
Calcium: 10.5 mg/dL (ref 8.4–10.5)
Chloride: 103 mEq/L (ref 96–112)
GFR: 75.01 mL/min (ref >60.00)
Glucose, Bld: 93 mg/dL (ref 70–99)
Potassium: 4.1 mEq/L (ref 3.5–5.1)
Sodium: 140 mEq/L (ref 135–145)
TOTAL PROTEIN: 6.7 g/dL (ref 6.0–8.3)

## 2016-02-12 LAB — TSH: TSH: 1.9 u[IU]/mL (ref 0.35–4.50)

## 2016-02-12 NOTE — Patient Instructions (Signed)
Limit your sodium (Salt) intake    It is important that you exercise regularly, at least 20 minutes 3 to 4 times per week.  If you develop chest pain or shortness of breath seek  medical attention.  Take a calcium supplement, plus [phone] units of vitamin D  Menopause is a normal process in which your reproductive ability comes to an end. This process happens gradually over a span of months to years, usually between the ages of 5 and 72. Menopause is complete when you have missed 12 consecutive menstrual periods. It is important to talk with your health care provider about some of the most common conditions that affect postmenopausal women, such as heart disease, cancer, and bone loss (osteoporosis). Adopting a healthy lifestyle and getting preventive care can help to promote your health and wellness. Those actions can also lower your chances of developing some of these common conditions. WHAT SHOULD I KNOW ABOUT MENOPAUSE? During menopause, you may experience a number of symptoms, such as:  Moderate-to-severe hot flashes.  Night sweats.  Decrease in sex drive.  Mood swings.  Headaches.  Tiredness.  Irritability.  Memory problems.  Insomnia. Choosing to treat or not to treat menopausal changes is an individual decision that you make with your health care provider. WHAT SHOULD I KNOW ABOUT HORMONE REPLACEMENT THERAPY AND SUPPLEMENTS? Hormone therapy products are effective for treating symptoms that are associated with menopause, such as hot flashes and night sweats. Hormone replacement carries certain risks, especially as you become older. If you are thinking about using estrogen or estrogen with progestin treatments, discuss the benefits and risks with your health care provider. WHAT SHOULD I KNOW ABOUT HEART DISEASE AND STROKE? Heart disease, heart attack, and stroke become more likely as you age. This may be due, in part, to the hormonal changes that your body experiences during  menopause. These can affect how your body processes dietary fats, triglycerides, and cholesterol. Heart attack and stroke are both medical emergencies. There are many things that you can do to help prevent heart disease and stroke:  Have your blood pressure checked at least every 1-2 years. High blood pressure causes heart disease and increases the risk of stroke.  If you are 67-10 years old, ask your health care provider if you should take aspirin to prevent a heart attack or a stroke.  Do not use any tobacco products, including cigarettes, chewing tobacco, or electronic cigarettes. If you need help quitting, ask your health care provider.  It is important to eat a healthy diet and maintain a healthy weight.  Be sure to include plenty of vegetables, fruits, low-fat dairy products, and lean protein.  Avoid eating foods that are high in solid fats, added sugars, or salt (sodium).  Get regular exercise. This is one of the most important things that you can do for your health.  Try to exercise for at least 150 minutes each week. The type of exercise that you do should increase your heart rate and make you sweat. This is known as moderate-intensity exercise.  Try to do strengthening exercises at least twice each week. Do these in addition to the moderate-intensity exercise.  Know your numbers.Ask your health care provider to check your cholesterol and your blood glucose. Continue to have your blood tested as directed by your health care provider. WHAT SHOULD I KNOW ABOUT CANCER SCREENING? There are several types of cancer. Take the following steps to reduce your risk and to catch any cancer development as early as  possible. Breast Cancer  Practice breast self-awareness.  This means understanding how your breasts normally appear and feel.  It also means doing regular breast self-exams. Let your health care provider know about any changes, no matter how small.  If you are 31 or older, have  a clinician do a breast exam (clinical breast exam or CBE) every year. Depending on your age, family history, and medical history, it may be recommended that you also have a yearly breast X-ray (mammogram).  If you have a family history of breast cancer, talk with your health care provider about genetic screening.  If you are at high risk for breast cancer, talk with your health care provider about having an MRI and a mammogram every year.  Breast cancer (BRCA) gene test is recommended for women who have family members with BRCA-related cancers. Results of the assessment will determine the need for genetic counseling and BRCA1 and for BRCA2 testing. BRCA-related cancers include these types:  Breast. This occurs in males or females.  Ovarian.  Tubal. This may also be called fallopian tube cancer.  Cancer of the abdominal or pelvic lining (peritoneal cancer).  Prostate.  Pancreatic. Cervical, Uterine, and Ovarian Cancer Your health care provider may recommend that you be screened regularly for cancer of the pelvic organs. These include your ovaries, uterus, and vagina. This screening involves a pelvic exam, which includes checking for microscopic changes to the surface of your cervix (Pap test).  For women ages 21-65, health care providers may recommend a pelvic exam and a Pap test every three years. For women ages 35-65, they may recommend the Pap test and pelvic exam, combined with testing for human papilloma virus (HPV), every five years. Some types of HPV increase your risk of cervical cancer. Testing for HPV may also be done on women of any age who have unclear Pap test results.  Other health care providers may not recommend any screening for nonpregnant women who are considered low risk for pelvic cancer and have no symptoms. Ask your health care provider if a screening pelvic exam is right for you.  If you have had past treatment for cervical cancer or a condition that could lead to  cancer, you need Pap tests and screening for cancer for at least 20 years after your treatment. If Pap tests have been discontinued for you, your risk factors (such as having a new sexual partner) need to be reassessed to determine if you should start having screenings again. Some women have medical problems that increase the chance of getting cervical cancer. In these cases, your health care provider may recommend that you have screening and Pap tests more often.  If you have a family history of uterine cancer or ovarian cancer, talk with your health care provider about genetic screening.  If you have vaginal bleeding after reaching menopause, tell your health care provider.  There are currently no reliable tests available to screen for ovarian cancer. Lung Cancer Lung cancer screening is recommended for adults 8-81 years old who are at high risk for lung cancer because of a history of smoking. A yearly low-dose CT scan of the lungs is recommended if you:  Currently smoke.  Have a history of at least 30 pack-years of smoking and you currently smoke or have quit within the past 15 years. A pack-year is smoking an average of one pack of cigarettes per day for one year. Yearly screening should:  Continue until it has been 15 years since you quit.  Stop if you develop a health problem that would prevent you from having lung cancer treatment. Colorectal Cancer  This type of cancer can be detected and can often be prevented.  Routine colorectal cancer screening usually begins at age 28 and continues through age 39.  If you have risk factors for colon cancer, your health care provider may recommend that you be screened at an earlier age.  If you have a family history of colorectal cancer, talk with your health care provider about genetic screening.  Your health care provider may also recommend using home test kits to check for hidden blood in your stool.  A small camera at the end of a tube  can be used to examine your colon directly (sigmoidoscopy or colonoscopy). This is done to check for the earliest forms of colorectal cancer.  Direct examination of the colon should be repeated every 5-10 years until age 75. However, if early forms of precancerous polyps or small growths are found or if you have a family history or genetic risk for colorectal cancer, you may need to be screened more often. Skin Cancer  Check your skin from head to toe regularly.  Monitor any moles. Be sure to tell your health care provider:  About any new moles or changes in moles, especially if there is a change in a mole's shape or color.  If you have a mole that is larger than the size of a pencil eraser.  If any of your family members has a history of skin cancer, especially at a young age, talk with your health care provider about genetic screening.  Always use sunscreen. Apply sunscreen liberally and repeatedly throughout the day.  Whenever you are outside, protect yourself by wearing long sleeves, pants, a wide-brimmed hat, and sunglasses. WHAT SHOULD I KNOW ABOUT OSTEOPOROSIS? Osteoporosis is a condition in which bone destruction happens more quickly than new bone creation. After menopause, you may be at an increased risk for osteoporosis. To help prevent osteoporosis or the bone fractures that can happen because of osteoporosis, the following is recommended:  If you are 6-49 years old, get at least 1,000 mg of calcium and at least 600 mg of vitamin D per day.  If you are older than age 18 but younger than age 13, get at least 1,200 mg of calcium and at least 600 mg of vitamin D per day.  If you are older than age 32, get at least 1,200 mg of calcium and at least 800 mg of vitamin D per day. Smoking and excessive alcohol intake increase the risk of osteoporosis. Eat foods that are rich in calcium and vitamin D, and do weight-bearing exercises several times each week as directed by your health care  provider. WHAT SHOULD I KNOW ABOUT HOW MENOPAUSE AFFECTS [place]? Depression may occur at any age, but it is more common as you become older. Common symptoms of depression include:  Low or sad mood.  Changes in sleep patterns.  Changes in appetite or eating patterns.  Feeling an overall lack of motivation or enjoyment of activities that you previously enjoyed.  Frequent crying spells. Talk with your health care provider if you think that you are experiencing depression. WHAT SHOULD I KNOW ABOUT IMMUNIZATIONS? It is important that you get and maintain your immunizations. These include:  Tetanus, diphtheria, and pertussis (Tdap) booster vaccine.  Influenza every year before the flu season begins.  Pneumonia vaccine.  Shingles vaccine. Your health care provider may also recommend  other immunizations.   This information is not intended to replace advice given to you by your health care provider. Make sure you discuss any questions you have with your health care provider.   Document Released: 11/21/2005 Document Revised: 10/20/2014 Document Reviewed: 06/01/2014 Elsevier Interactive Patient Education [employer].

## 2016-02-12 NOTE — Progress Notes (Signed)
[name], female   DOB: [birth year], 70 y.o.   MRN: [id number]  Subjective:    [name], female    DOB: 1946/08/19, 70 y.o.   MRN: [id number]  HPI   CC: cpx - doing well.   History of Present Illness:   70  year-old patient who is seen today for a wellness exam.  She has a history of complex diverticular disease and is status post  Colonoscopy in 2011. She has a history menopausal syndrome and also a history of idiopathic peripheral autonomic neuropathy. She has chronic tinnitus.  She has a history of mild asthma as well as dyslipidemia. Her pulmonary status has been stable.  She has pulmonary follow-up scheduled.  Mammogram October 2016   Here for Medicare AWV:  1. Risk factors based on Past M, S, F history: risk factors include hypercholesterolemia. She denies any cardiopulmonary complaints  2. Physical Activities: remains active, walks daily,does outdoor gardening  3. Depression/mood: no history of depression or mood disorder  4. Hearing: mild impairment. She states this is being fitted for hearing aids. She does have chronic tinnitus  5. ADL's: independent in all aspects of daily living  6. Fall Risk: Moderate due to PN, but no history of falls 7. Home Safety: no problems identified  8. Height, weight, &visual acuity:height and weight are stable with glasses, and no change in her visual acuity  9. Counseling: exercise discussed and encouraged  10. Labs ordered based on risk factors: laboratory profile, including lipid panel will be reviewed  11. Referral Coordination- mammogram, encouraged  12. Care Plan- will continue calcium and vitamin D supplementation, more regular exercise regimen encouraged  13. Cognitive Assessment- alert and oriented, with normal affect. No difficulty handling all executive functions. No memory deficit  14.  Preventive services include annual health examination with mammogram and screening lab. Patient was provided with a  written and personalized care plan.  Will need follow-up colonoscopy at 10 year intervals  15.  Provider list includes primary care radiology and GI.  Preventive Screening-Counseling & Management  Alcohol-Tobacco  Smoking Status: never   Allergies:  1) ! Sulfa   Past History:  Past Medical History:   Colonic polyps, hx of  Mild Hypercholesterolemia  complex diverticular disease  peripheral neuropathy  Tinnitus  remote history of asthma   Past Surgical History:  Hysterectomy 96  Lumbar laminectomy 01  Nasal sx  Right shoulder sx  Colonoscopy-09/07/2004, 2011 (hyperplastic polyp) 1996 history of diverticular abscess   Family History:   Fam hx MI  Family History Diabetes 1st degree relative  Family History Lung cancer  Fam hx COPD  father died of an MI at age 51. History of COPD  mother died of lung cancer at 41  One brother, COPD, bladder Ca (deceased)  Three sisters, one died at 23 weeks of age  one sister died at 13 complications of diabetes   Social History:   Married  discontinue smoking 61 years ago Son died age 21 massive MI  Past Medical History  Diagnosis Date  . CHEST PAIN 11/20/2008  . COLONIC POLYPS, HX OF 05/24/2007  . DYSPNEA ON EXERTION 12/19/2008  . HYPERCHOLESTEROLEMIA 09/18/2008  . 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 POSTMENOPAUSAL SYNDROME 04/12/2008  . 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 Diverticular disease   . Peripheral neuropathy [facility])     Social History   Social History  . Marital Status: Married    Spouse Name: N/A  .  Number of Children: N/A  . Years of Education: N/A   Occupational History  . retired    Social History Main Topics  . Smoking status: Former Smoker -- 1.00 packs/day for 53 years    Types: Cigarettes    Quit date: 10/13/1996  . Smokeless tobacco: Never Used     Comment: smoked 1-1.5 PPD   . Alcohol Use: No  . Drug Use: No  . Sexual Activity: Not on file   Other Topics Concern  . Not on file    Social History Narrative    Past Surgical History  Procedure Laterality Date  . Abdominal hysterectomy    . Lumbar laminectomy    . Nasal sinus surgery    . Shoulder surgery      Family History  Problem Relation Age of Onset  . Cancer Mother     died of lung Ca  . COPD Father   . Heart disease Father     died of MI  . Cancer Brother     bladder Ca  . COPD Brother     Allergies  Allergen Reactions  . Sulfonamide Derivatives     REACTION: headache    Current Outpatient Prescriptions on File Prior to Visit  Medication Sig Dispense Refill  . simvastatin (ZOCOR) 40 MG tablet TAKE ONE TABLET BY MOUTH ONCE DAILY 90 tablet 1  . tiotropium (SPIRIVA) 18 MCG inhalation capsule Place 1 capsule (18 mcg total) into inhaler and inhale daily. 30 capsule 12  . traMADol (ULTRAM) 50 MG tablet TAKE ONE TABLET BY MOUTH TWICE DAILY AS NEEDED FOR PAIN 90 tablet 2  . triamcinolone (NASACORT ALLERGY 24HR) 55 MCG/ACT AERO nasal inhaler Place 2 sprays into the nose daily. 1 Inhaler 12  . VENTOLIN HFA 108 (90 Base) MCG/ACT inhaler INHALE TWO PUFFS BY MOUTH EVERY 6 HOURS AS NEEDED 18 each 3   No current facility-administered medications on file prior to visit.    BP 146/70 mmHg  Pulse 75  Temp(Src) 98.4 F (36.9 C) (Oral)  Resp 20  Ht 5' 1.5" (1.562 m)  Wt 146 lb (66.225 kg)  BMI 27.14 kg/m2  SpO2 98%     Review of Systems  Constitutional: Negative for fever, appetite change, fatigue and unexpected weight change.  HENT: Positive for tinnitus (chronic right-sided tinnitus). Negative for congestion, dental problem, ear pain, hearing loss ( uses a hearing in frequently), mouth sores, nosebleeds, sinus pressure, sore throat, trouble swallowing and voice change.   Eyes: Negative for photophobia, pain, redness and visual disturbance.  Respiratory: Negative for cough, chest tightness and shortness of breath.   Cardiovascular: Negative for chest pain, palpitations and leg swelling.   Gastrointestinal: Negative for nausea, vomiting, abdominal pain, diarrhea, constipation, blood in stool, abdominal distention and rectal pain.  Genitourinary: Negative for dysuria, urgency, frequency, hematuria, flank pain, vaginal bleeding, vaginal discharge, difficulty urinating, genital sores, vaginal pain, menstrual problem and pelvic pain.  Musculoskeletal: Negative for back pain, arthralgias and neck stiffness.  Skin: Negative for rash.  Neurological: Negative for dizziness, syncope, speech difficulty, weakness, light-headedness, numbness and headaches.  Hematological: Negative for adenopathy. Does not bruise/bleed easily.  Psychiatric/Behavioral: Negative for suicidal ideas, behavioral problems, self-injury, dysphoric mood and agitation. The patient is not nervous/anxious.        Objective:   Physical Exam  Constitutional: She is oriented to person, place, and time. She appears well-developed and well-nourished.  HENT:  Head: Normocephalic and atraumatic.  Right Ear: External ear normal.  Left Ear: External  ear normal.  Mouth/Throat: Oropharynx is clear and moist.  Edentulous  Eyes: Conjunctivae and EOM are normal.  Neck: Normal range of motion. Neck supple. No JVD present. No thyromegaly present.  Cardiovascular: Normal rate, regular rhythm, normal heart sounds and intact distal pulses.   No murmur heard. Pedal pulses faint  Pulmonary/Chest: Effort normal and breath sounds normal. She has no wheezes. She has no rales.  Abdominal: Soft. Bowel sounds are normal. She exhibits no distension and no mass. There is no tenderness. There is no rebound and no guarding.  Musculoskeletal: Normal range of motion. She exhibits no edema or tenderness.  Neurological: She is alert and oriented to person, place, and time. She has normal reflexes. No cranial nerve deficit. She exhibits normal muscle tone. Coordination normal.  Skin: Skin is warm and dry. No rash noted.  Psychiatric: She has a  normal mood and affect. Her behavior is normal.          Assessment & Plan:   Preventive health exam.   Hypercholesterolemia. We'll check a lipid profile  History of colonic polyps. These were hyperplastic.  10 year interval appropriate Asthma mild and stable. Continue Spiriva.  Patient has pulmonary consultation scheduled Recheck 1 year or as needed

## 2016-02-12 NOTE — Progress Notes (Signed)
Pre visit review using our clinic review tool, if applicable. No additional management support is needed unless otherwise documented below in the visit note. 

## 2016-03-21 ENCOUNTER — Telehealth: Payer: Self-pay | Admitting: Internal Medicine

## 2016-03-21 NOTE — Telephone Encounter (Signed)
Noted  

## 2016-03-21 NOTE — Telephone Encounter (Signed)
FYI:  Pt would like for you to totally ignore any questions that [employer] may send to you.

## 2016-05-22 ENCOUNTER — Ambulatory Visit (INDEPENDENT_AMBULATORY_CARE_PROVIDER_SITE_OTHER): Payer: Medicare HMO | Admitting: Pulmonary Disease

## 2016-05-22 ENCOUNTER — Encounter: Payer: Self-pay | Admitting: Pulmonary Disease

## 2016-05-22 VITALS — BP 126/82 | HR 76 | Ht 63.0 in | Wt 146.0 lb

## 2016-05-22 DIAGNOSIS — J45909 Unspecified asthma, uncomplicated: Secondary | ICD-10-CM

## 2016-05-22 DIAGNOSIS — R05 Cough: Secondary | ICD-10-CM | POA: Diagnosis not present

## 2016-05-22 DIAGNOSIS — J432 Centrilobular emphysema: Secondary | ICD-10-CM

## 2016-05-22 DIAGNOSIS — J449 Chronic obstructive pulmonary disease, unspecified: Secondary | ICD-10-CM | POA: Diagnosis not present

## 2016-05-22 DIAGNOSIS — R058 Other specified cough: Secondary | ICD-10-CM

## 2016-05-22 NOTE — Progress Notes (Signed)
Current Outpatient Prescriptions on File Prior to Visit  Medication Sig  . simvastatin (ZOCOR) 40 MG tablet TAKE ONE TABLET BY MOUTH ONCE DAILY  . traMADol (ULTRAM) 50 MG tablet TAKE ONE TABLET BY MOUTH TWICE DAILY AS NEEDED FOR PAIN  . triamcinolone (NASACORT ALLERGY 24HR) 55 MCG/ACT AERO nasal inhaler Place 2 sprays into the nose daily.  . VENTOLIN HFA 108 (90 Base) MCG/ACT inhaler INHALE TWO PUFFS BY MOUTH EVERY 6 HOURS AS NEEDED   No current facility-administered medications on file prior to visit.     Chief Complaint  Patient presents with  . Follow-up    Pt states that her cough is very little. Breathing is good - denies any new complaints.     Pulmonary tests PFT 03/21/15 >> FEV1 1.43 (66%), FEV1% 66, TLC 5.56 (113%), DLCO 52%, + BD   Past medical history HLD, Neuropathy, Diverticulosis  Past surgical history, Family history, Social history, Allergies reviewed.  Vital signs BP 126/82 (BP Location: Right Arm, Cuff Size: Normal)   Pulse 76   Ht 5\' 3"  (1.6 m)   Wt 146 lb (66.2 kg)   SpO2 98%   BMI 25.86 kg/m   History of Present Illness: Susan Collins is a 70 y.o. female former smoker with COPD/asthma and emphysema.  She has been doing well.  She gets outside and works on her vegetable garden, and no issues keeping up with activities.  She will occasional get wheezing if weather changes >> uses albuterol and feels better.  Denies chest pain, sputum, fever, or leg swelling.  She will get sinus congestion and scratchy throat.  She has been using nasacort.  She has discomfort in right ear, but this has been chronic.  Physical Exam:  General - No distress ENT - No sinus tenderness, no oral exudate, no LAN, wears dentures Cardiac - s1s2 regular, no murmur Chest - No wheeze/rales/dullness Back - No focal tenderness Abd - Soft, non-tender Ext - No edema Neuro - Normal strength Skin - No rashes Psych - normal mood, and behavior   Assessment/Plan:  COPD with asthma  and emphysema. - continue spiriva and prn albuterol - she will get her flu shot with PCP  Upper airway cough syndrome with allergic rhinitis. - prn nasacort - prn salt water gargles   Patient Instructions  Follow up in 1 year    Chesley Mires, MD [facility] Pager:  [phone] 05/22/2016, 10:06 AM

## 2016-05-22 NOTE — Patient Instructions (Signed)
Follow up in 1 year.

## 2016-06-17 ENCOUNTER — Ambulatory Visit (INDEPENDENT_AMBULATORY_CARE_PROVIDER_SITE_OTHER): Payer: Medicare HMO | Admitting: Internal Medicine

## 2016-06-17 ENCOUNTER — Encounter: Payer: Self-pay | Admitting: Internal Medicine

## 2016-06-17 VITALS — BP 126/60 | HR 81 | Temp 98.3°F | Resp 20 | Ht 63.0 in | Wt 147.0 lb

## 2016-06-17 DIAGNOSIS — J449 Chronic obstructive pulmonary disease, unspecified: Secondary | ICD-10-CM | POA: Diagnosis not present

## 2016-06-17 DIAGNOSIS — J45909 Unspecified asthma, uncomplicated: Secondary | ICD-10-CM

## 2016-06-17 NOTE — Patient Instructions (Signed)
Use saline irrigation, warm  moist compresses and over-the-counter decongestants only as directed.  Call if there is no improvement in 5 to 7 days, or sooner if you develop increasing pain, fever, or any new symptoms.  Call or return to clinic prn if these symptoms worsen or fail to improve as anticipated.

## 2016-06-17 NOTE — Progress Notes (Signed)
Subjective:    [name], female    DOB: 1946-05-12, 70 y.o.   MRN: [id number]  HPI  70 year old patient who has a history of COPD with asthma as well as allergic rhinitis.  She was seen by pulmonary medicine one month ago. Complaints the include right ear discomfort over 2 months duration.  However, she states the pain over the past day or so has resolved.  She complains also of some mild hoarseness.  Her pulmonary status appears to be stable.  She remains on Rhinocort but no albuterol use.  Past Medical History:  Diagnosis Date  . CHEST PAIN 11/20/2008  . COLONIC POLYPS, HX OF 05/24/2007  . Diverticular disease   . DYSPNEA ON EXERTION 12/19/2008  . HYPERCHOLESTEROLEMIA 09/18/2008  . 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 Peripheral neuropathy ([place])   . POSTMENOPAUSAL SYNDROME 04/12/2008  . TINNITUS, 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     Social History   Social History  . Marital status: Married    Spouse name: N/A  . Number of children: N/A  . Years of education: N/A   Occupational History  . retired    Social History Main Topics  . Smoking status: Former Smoker    Packs/day: 1.00    Years: 53.00    Types: Cigarettes    Quit date: 10/13/1996  . Smokeless tobacco: Never Used     Comment: smoked 1-1.5 PPD   . Alcohol use No  . Drug use: No  . Sexual activity: Not on file   Other Topics Concern  . Not on file   Social History Narrative  . No narrative on file    Past Surgical History:  Procedure Laterality Date  . ABDOMINAL HYSTERECTOMY    . LUMBAR LAMINECTOMY    . NASAL SINUS SURGERY    . SHOULDER SURGERY      Family History  Problem Relation Age of Onset  . Cancer Mother     died of lung Ca  . COPD Father   . Heart disease Father     died of MI  . Cancer Brother     bladder Ca  . COPD Brother     Allergies  Allergen Reactions  . Sulfonamide Derivatives     REACTION: headache    Current Outpatient Prescriptions on File Prior to  Visit  Medication Sig Dispense Refill  . Cholecalciferol (VITAMIN D3) 1000 units CAPS Take 1 capsule by mouth daily.    Marland Kitchen guaiFENesin (MUCINEX) 600 MG 12 hr tablet Take 600-1,200 mg by mouth 2 (two) times daily.    . simvastatin (ZOCOR) 40 MG tablet TAKE ONE TABLET BY MOUTH ONCE DAILY 90 tablet 1  . Tiotropium Bromide Monohydrate (SPIRIVA RESPIMAT) 2.5 MCG/ACT AERS Inhale 2 puffs into the lungs daily.    . traMADol (ULTRAM) 50 MG tablet TAKE ONE TABLET BY MOUTH TWICE DAILY AS NEEDED FOR PAIN 90 tablet 2  . triamcinolone (NASACORT ALLERGY 24HR) 55 MCG/ACT AERO nasal inhaler Place 2 sprays into the nose daily. 1 Inhaler 12  . VENTOLIN HFA 108 (90 Base) MCG/ACT inhaler INHALE TWO PUFFS BY MOUTH EVERY 6 HOURS AS NEEDED 18 each 3   No current facility-administered medications on file prior to visit.     BP 126/60 (BP Location: Left Arm, Patient Position: Sitting, Cuff Size: Normal)   Pulse 81   Temp 98.3 F (36.8 C) (Oral)   Resp 20   Ht 5\' 3"  (1.6 m)   Wt 147 lb (66.7  kg)   SpO2 97%   BMI 26.04 kg/m     Review of Systems  Constitutional: Negative.   HENT: Positive for ear pain, rhinorrhea, sore throat and voice change. Negative for congestion, dental problem, ear discharge, hearing loss, sinus pressure and tinnitus.   Eyes: Negative for pain, discharge and visual disturbance.  Respiratory: Negative for cough, shortness of breath and wheezing.   Cardiovascular: Negative for chest pain, palpitations and leg swelling.  Gastrointestinal: Negative for abdominal distention, abdominal pain, blood in stool, constipation, diarrhea, nausea and vomiting.  Genitourinary: Negative for difficulty urinating, dysuria, flank pain, frequency, hematuria, pelvic pain, urgency, vaginal bleeding, vaginal discharge and vaginal pain.  Musculoskeletal: Negative for arthralgias, gait problem and joint swelling.  Skin: Negative for rash.  Neurological: Negative for dizziness, syncope, speech difficulty,  weakness, numbness and headaches.  Hematological: Negative for adenopathy.  Psychiatric/Behavioral: Negative for agitation, behavioral problems and dysphoric mood. The patient is not nervous/anxious.        Objective:   Physical Exam  Constitutional: She is oriented to person, place, and time. She appears well-developed and well-nourished.  HENT:  Head: Normocephalic.  Right Ear: External ear normal.  Left Ear: External ear normal.  Mouth/Throat: Oropharynx is clear and moist.  Dentures in place Left tympanic membranes normal Right TM with some chronic changes only  Eyes: Conjunctivae and EOM are normal. Pupils are equal, round, and reactive to light.  Neck: Normal range of motion. Neck supple. No thyromegaly present.  Cardiovascular: Normal rate, regular rhythm, normal heart sounds and intact distal pulses.   Pulmonary/Chest: Effort normal and breath sounds normal. She has no wheezes. She has no rales. She exhibits no tenderness.  Abdominal: Soft. Bowel sounds are normal. She exhibits no mass. There is no tenderness.  Musculoskeletal: Normal range of motion.  Lymphadenopathy:    She has no cervical adenopathy.  Neurological: She is alert and oriented to person, place, and time.  Skin: Skin is warm and dry. No rash noted.  Psychiatric: She has a normal mood and affect. Her behavior is normal.          Assessment & Plan:   COPD with asthma, stable Allergic rhinitis Chronic right ear pain.  If develops recurrent pain, we'll try an oral antihistamine as well as saline irrigation  [name]

## 2016-06-17 NOTE — Progress Notes (Signed)
Pre visit review using our clinic review tool, if applicable. No additional management support is needed unless otherwise documented below in the visit note. 

## 2016-06-20 ENCOUNTER — Telehealth: Payer: Self-pay | Admitting: Internal Medicine

## 2016-06-20 MED ORDER — CYCLOBENZAPRINE HCL 5 MG PO TABS: 5.0000 mg | ORAL_TABLET | Freq: Three times a day (TID) | ORAL | 0 refills | Status: DC | PRN

## 2016-06-20 NOTE — Telephone Encounter (Signed)
Please see message and advise 

## 2016-06-20 NOTE — Telephone Encounter (Signed)
Left message on voicemail Rx sent to pharmacy as requested. Any questions please call office. 

## 2016-06-20 NOTE — Telephone Encounter (Signed)
Generic Flexeril 5 #30 one 3 times a day as needed

## 2016-06-20 NOTE — Telephone Encounter (Signed)
Pt states he is having muscle spasms in her neck and left shoulder. Pt states she has had before and would like Dr K to send in something again (muscle relaxer).  [facility]/ [place]  Pt was just seen in the office 9/05

## 2016-07-14 ENCOUNTER — Other Ambulatory Visit: Payer: Self-pay | Admitting: Internal Medicine

## 2016-08-09 LAB — HM MAMMOGRAPHY

## 2016-08-12 ENCOUNTER — Encounter: Payer: Self-pay | Admitting: Internal Medicine

## 2016-10-14 ENCOUNTER — Other Ambulatory Visit: Payer: Self-pay | Admitting: Internal Medicine

## 2016-11-20 DIAGNOSIS — H25099 Other age-related incipient cataract, unspecified eye: Secondary | ICD-10-CM | POA: Diagnosis not present

## 2017-01-06 ENCOUNTER — Other Ambulatory Visit: Payer: Self-pay | Admitting: Internal Medicine

## 2017-01-14 ENCOUNTER — Other Ambulatory Visit: Payer: Self-pay | Admitting: Internal Medicine

## 2017-05-08 ENCOUNTER — Other Ambulatory Visit: Payer: Self-pay | Admitting: Internal Medicine

## 2017-05-19 ENCOUNTER — Encounter: Payer: Self-pay | Admitting: Internal Medicine

## 2017-05-19 ENCOUNTER — Ambulatory Visit (INDEPENDENT_AMBULATORY_CARE_PROVIDER_SITE_OTHER): Payer: PPO | Admitting: Internal Medicine

## 2017-05-19 VITALS — BP 128/62 | HR 71 | Temp 98.2°F | Ht 63.0 in | Wt 141.8 lb

## 2017-05-19 DIAGNOSIS — E7800 Pure hypercholesterolemia, unspecified: Secondary | ICD-10-CM

## 2017-05-19 DIAGNOSIS — Z0000 Encounter for general adult medical examination without abnormal findings: Secondary | ICD-10-CM | POA: Diagnosis not present

## 2017-05-19 DIAGNOSIS — C4441 Basal cell carcinoma of skin of scalp and neck: Secondary | ICD-10-CM

## 2017-05-19 DIAGNOSIS — J449 Chronic obstructive pulmonary disease, unspecified: Secondary | ICD-10-CM | POA: Diagnosis not present

## 2017-05-19 DIAGNOSIS — E785 Hyperlipidemia, unspecified: Secondary | ICD-10-CM

## 2017-05-19 DIAGNOSIS — Z86010 Personal history of colonic polyps: Secondary | ICD-10-CM

## 2017-05-19 LAB — CBC WITH DIFFERENTIAL/PLATELET
Basophils Absolute: 0.1 10*3/uL (ref 0.0–0.1)
Basophils Relative: 0.9 % (ref 0.0–3.0)
Eosinophils Absolute: 0.2 10*3/uL (ref 0.0–0.7)
Eosinophils Relative: 2.9 % (ref 0.0–5.0)
HCT: 36.6 % (ref 36.0–46.0)
Hemoglobin: 12.1 g/dL (ref 12.0–15.0)
LYMPHS ABS: 2.2 10*3/uL (ref 0.7–4.0)
Lymphocytes Relative: 32.5 % (ref 12.0–46.0)
MCHC: 33 g/dL (ref 30.0–36.0)
MCV: 92.3 fl (ref 78.0–100.0)
MONO ABS: 0.5 10*3/uL (ref 0.1–1.0)
Monocytes Relative: 7.6 % (ref 3.0–12.0)
NEUTROS ABS: 3.9 10*3/uL (ref 1.4–7.7)
NEUTROS PCT: 56.1 % (ref 43.0–77.0)
Platelets: 236 10*3/uL (ref 150.0–400.0)
RBC: 3.97 Mil/uL (ref 3.87–5.11)
RDW: 13.2 % (ref 11.5–15.5)
WBC: 6.9 10*3/uL (ref 4.0–10.5)

## 2017-05-19 LAB — LIPID PANEL
CHOL/HDL RATIO: 3
Cholesterol: 168 mg/dL (ref 0–200)
HDL: 53.6 mg/dL (ref >39.00)
LDL Cholesterol: 83 mg/dL (ref 0–99)
NONHDL: 114.04
Triglycerides: 156 mg/dL — ABNORMAL HIGH (ref 0.0–149.0)
VLDL: 31.2 mg/dL (ref 0.0–40.0)

## 2017-05-19 LAB — TSH: TSH: 1.83 u[IU]/mL (ref 0.35–4.50)

## 2017-05-19 LAB — COMPREHENSIVE METABOLIC PANEL
ALK PHOS: 59 U/L (ref 39–117)
ALT: 13 U/L (ref 0–35)
AST: 17 U/L (ref 0–37)
Albumin: 4.1 g/dL (ref 3.5–5.2)
BUN: 14 mg/dL (ref 6–23)
CO2: 30 meq/L (ref 19–32)
CREATININE: 0.83 mg/dL (ref 0.40–1.20)
Calcium: 10.5 mg/dL (ref 8.4–10.5)
Chloride: 104 mEq/L (ref 96–112)
GFR: 71.64 mL/min (ref >60.00)
Glucose, Bld: 95 mg/dL (ref 70–99)
Potassium: 4.2 mEq/L (ref 3.5–5.1)
Sodium: 141 mEq/L (ref 135–145)
TOTAL PROTEIN: 6.9 g/dL (ref 6.0–8.3)
Total Bilirubin: 0.9 mg/dL (ref 0.2–1.2)

## 2017-05-19 MED ORDER — TIOTROPIUM BROMIDE MONOHYDRATE 2.5 MCG/ACT IN AERS: INHALATION_SPRAY | RESPIRATORY_TRACT | 11 refills | Status: DC

## 2017-05-19 NOTE — Progress Notes (Signed)
Subjective:    [name], female    DOB: 03-Jul-1946, 71 y.o.   MRN: [id number]  HPI   71 year old patient who is seen today for a preventive health examination and subsequent Medicare wellness visit  She enjoys excellent health.  She has a history of COPD with asthma, mild dyslipidemia, controlled with statin therapy.  She has done quite well. In 2011 she underwent colonoscopy.  The revealed a single hyperplastic polyp. Her palmar status has been quite stable. Since her last annual exam.  She has had some voluntary weight loss.  She feels quite well today.  Her asthma has been stable.  She has had no rescue albuterol use.  Mammogram October 2017  Wt Readings from Last 3 Encounters:  05/19/17 141 lb 12.8 oz (64.3 kg)  06/17/16 147 lb (66.7 kg)  05/22/16 146 lb (66.2 kg)     Preventive Screening-Counseling & Management  Alcohol-Tobacco  Smoking Status: never   Allergies:  1) ! Sulfa   Past History:  Past Medical History:   Colonic polyps, hx of  Mild Hypercholesterolemia  complex diverticular disease  peripheral neuropathy  Tinnitus  remote history of asthma   Past Surgical History:  Hysterectomy 96  Lumbar laminectomy 01  Nasal sx  Right shoulder sx  Colonoscopy-09/07/2004, 2011 (hyperplastic polyp) 1996 history of diverticular abscess   Family History:   Fam hx MI  Family History Diabetes 1st degree relative  Family History Lung cancer  Fam hx COPD  father died of an MI at age 40. History of COPD  mother died of lung cancer at 3  One brother, COPD, bladder Ca (deceased)  Three sisters, one died at 27 weeks of age  one sister died at 62 complications of diabetes   Social History:   Married  discontinue smoking 69 years ago Son died age 21 massive MI   Medicare wellness visit  1. Risk factors, based on past  M,S,F history.  Cardiovascular risk factors include a history of dyslipidemia  2.  Physical activities:no exercise  restrictions.  Walks 3-4 times per week.  Does some outdoor gardening  3.  Depression/mood:no history of major depression or mood disorder  4.  Hearing:no deficits  5.  ADL's:independent  6.  Fall risk:low  7.  Home safety:no problems identified  8.  Height weight, and visual acuity;height and weight stable no change in visual acuity.  Wears glasses  9.  Counseling:continue heart healthy diet.  More rigorous exercise regimen encouraged  10. Lab orders based on risk factors:laboratory update reviewed, including lipid panel  11. Referral :not appropriate at this time  12. Care plan:follow-up mammogram later this year.  Continue efforts at aggressive risk factor modification  13. Cognitive assessment: alert and oriented with normal affect.  No cognitive dysfunction  14. Screening: Patient provided with a written and personalized 5-10 year screening schedule in the AVS.  We'll continue annual exams with mammograms.  Colonoscopies at 10 year intervals  15. Provider List Update: primary care pulmonary radiology    Review of Systems  Constitutional: Negative.   HENT: Negative for congestion, dental problem, hearing loss, rhinorrhea, sinus pressure, sore throat and tinnitus.   Eyes: Negative for pain, discharge and visual disturbance.  Respiratory: Negative for cough and shortness of breath.   Cardiovascular: Negative for chest pain, palpitations and leg swelling.  Gastrointestinal: Negative for abdominal distention, abdominal pain, blood in stool, constipation, diarrhea, nausea and vomiting.  Genitourinary: Negative for difficulty urinating, dysuria, flank pain,  frequency, hematuria, pelvic pain, urgency, vaginal bleeding, vaginal discharge and vaginal pain.  Musculoskeletal: Negative for arthralgias, gait problem and joint swelling.  Skin: Negative for rash.  Neurological: Negative for dizziness, syncope, speech difficulty, weakness, numbness and headaches.  Hematological: Negative  for adenopathy.  Psychiatric/Behavioral: Negative for agitation, behavioral problems and dysphoric mood. The patient is not nervous/anxious.        Objective:   Physical Exam  Constitutional: She is oriented to person, place, and time. She appears well-developed and well-nourished.  HENT:  Head: Normocephalic and atraumatic.  Right Ear: External ear normal.  Left Ear: External ear normal.  Mouth/Throat: Oropharynx is clear and moist.  edentulous  Eyes: Conjunctivae and EOM are normal.  Neck: Normal range of motion. Neck supple. No JVD present. No thyromegaly present.  Cardiovascular: Normal rate, regular rhythm, normal heart sounds and intact distal pulses.   No murmur heard. Dorsalis pedis pulses full.  Posterior tibial pulses faint  Pulmonary/Chest: Effort normal and breath sounds normal. She has no wheezes. She has no rales.  Abdominal: Soft. Bowel sounds are normal. She exhibits no distension and no mass. There is no tenderness. There is no rebound and no guarding.  Genitourinary: Vagina normal.  Musculoskeletal: Normal range of motion. She exhibits no edema or tenderness.  Neurological: She is alert and oriented to person, place, and time. She has normal reflexes. No cranial nerve deficit. She exhibits normal muscle tone. Coordination normal.  Laboratory sensation and monofilament testing intact  Skin: Skin is warm and dry. No rash noted.  5 mm papular lesion right lateral neck  Psychiatric: She has a normal mood and affect. Her behavior is normal.          Assessment & Plan:   Preventive health examination Assessment Medicare wellness visit History of asthma, stable.  No change in maintenance medications Dyslipidemia.  Continue statin therapy.  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  Dermatology referral  Continue annual mammograms Check screening  lab Review lipid profile  Follow-up one year or as needed  Susan Collins

## 2017-05-19 NOTE — Patient Instructions (Signed)
Limit your sodium (Salt) intake    It is important that you exercise regularly, at least 20 minutes 3 to 4 times per week.  If you develop chest pain or shortness of breath seek  medical attention.  Dermatology consultation as discussed  Continue annual mammograms  Return in one year for follow-up

## 2017-05-21 ENCOUNTER — Ambulatory Visit (INDEPENDENT_AMBULATORY_CARE_PROVIDER_SITE_OTHER): Payer: PPO | Admitting: Pulmonary Disease

## 2017-05-21 ENCOUNTER — Encounter: Payer: Self-pay | Admitting: Pulmonary Disease

## 2017-05-21 VITALS — BP 132/58 | HR 72 | Ht 63.0 in | Wt 141.0 lb

## 2017-05-21 DIAGNOSIS — J432 Centrilobular emphysema: Secondary | ICD-10-CM | POA: Diagnosis not present

## 2017-05-21 DIAGNOSIS — J449 Chronic obstructive pulmonary disease, unspecified: Secondary | ICD-10-CM

## 2017-05-21 DIAGNOSIS — R058 Other specified cough: Secondary | ICD-10-CM

## 2017-05-21 DIAGNOSIS — R05 Cough: Secondary | ICD-10-CM | POA: Diagnosis not present

## 2017-05-21 NOTE — Patient Instructions (Addendum)
Spiriva two puffs daily  Can try using nasacort as needed for sinus congestion  Follow up in 1 year

## 2017-05-21 NOTE — Progress Notes (Signed)
Current Outpatient Prescriptions on File Prior to Visit  Medication Sig  . Cholecalciferol (VITAMIN D3) 1000 units CAPS Take 1 capsule by mouth daily.  Marland Kitchen guaiFENesin (MUCINEX) 600 MG 12 hr tablet Take 600-1,200 mg by mouth 2 (two) times daily.  . simvastatin (ZOCOR) 40 MG tablet TAKE ONE TABLET BY MOUTH ONCE DAILY  . Tiotropium Bromide Monohydrate (SPIRIVA RESPIMAT) 2.5 MCG/ACT AERS INHALE TWO SPRAY(S) BY MOUTH INTO THE LUNGS DAILY  . traMADol (ULTRAM) 50 MG tablet TAKE ONE TABLET BY MOUTH TWICE DAILY AS NEEDED FOR PAIN  . triamcinolone (NASACORT ALLERGY 24HR) 55 MCG/ACT AERO nasal inhaler Place 2 sprays into the nose daily.  . VENTOLIN HFA 108 (90 Base) MCG/ACT inhaler INHALE TWO PUFFS BY MOUTH EVERY 6 HOURS AS NEEDED   No current facility-administered medications on file prior to visit.     Chief Complaint  Patient presents with  . Follow-up    Denies problems with breathing. Pt notes that whewn she over-exerts herlsef she will have increased SOB which is resolved with rest and pursed lip breathing.     Pulmonary tests PFT 03/21/15 >> FEV1 1.43 (66%), FEV1% 66, TLC 5.56 (113%), DLCO 52%, + BD   Past medical history HLD, Neuropathy, Diverticulosis  Past surgical history, Family history, Social history, Allergies reviewed.  Vital signs BP (!) 132/58 (BP Location: Left Arm, Cuff Size: Normal)   Pulse 72   Ht 5\' 3"  (1.6 m)   Wt 141 lb (64 kg)   SpO2 98%   BMI 24.98 kg/m   History of Present Illness: Susan Collins is a 71 y.o. female former smoker with COPD/asthma and emphysema.  She gets winded if she exerts herself too quickly, or if the weather is damp.  Otherwise she does okay.  Not having cough, wheeze, sputum, sinus congestion, sore throat, or leg swelling.  No issues with her breathing while asleep.  Physical Exam:  General - pleasant Eyes - pupils reactive, wears glasses ENT - no sinus tenderness, no oral exudate, no LAN Cardiac - regular, no murmur Chest - no  wheeze, rales Abd - soft, non tender Ext - no edema Skin - no rashes Neuro - normal strength Psych - normal mood    Assessment/Plan:  COPD with asthma and emphysema. - continue spiriva >> advised her to use two puffs once daily - ventolin prn  Upper airway cough syndrome with allergic rhinitis. - advised she could change nasacort to prn    Patient Instructions  Spiriva two puffs daily  Can try using nasacort as needed for sinus congestion  Follow up in 1 year    Susan Mires, MD [facility] Pager:  [phone] 05/21/2017, 9:54 AM

## 2017-07-23 ENCOUNTER — Telehealth: Payer: Self-pay | Admitting: Internal Medicine

## 2017-07-23 NOTE — Telephone Encounter (Signed)
Pt has an AWV scheduled for 10/18 - pt is not due until 05/2018 -  lmom to call us back so we can cancel the appt.

## 2017-07-30 ENCOUNTER — Ambulatory Visit: Payer: PPO

## 2017-07-31 ENCOUNTER — Other Ambulatory Visit: Payer: Self-pay | Admitting: Internal Medicine

## 2017-08-03 ENCOUNTER — Telehealth: Payer: Self-pay

## 2017-08-03 NOTE — Telephone Encounter (Signed)
Patient called and is asking to reduce her Tramadol to #60 for 90D supply.   Dr. Raliegh Ip - Please advise. Thanks!

## 2017-08-07 NOTE — Telephone Encounter (Signed)
This message does not appear to have been addressed yet.  Dr. Raliegh Ip - Please advise. Thanks!

## 2017-08-10 NOTE — Telephone Encounter (Signed)
okay

## 2017-08-12 NOTE — Telephone Encounter (Signed)
Spoke with pt and advised. Asked her to remind is to change # next time she calls in for refill. Nothing further needed at this time.

## 2017-08-24 DIAGNOSIS — D485 Neoplasm of uncertain behavior of skin: Secondary | ICD-10-CM | POA: Diagnosis not present

## 2017-08-24 DIAGNOSIS — C4441 Basal cell carcinoma of skin of scalp and neck: Secondary | ICD-10-CM | POA: Diagnosis not present

## 2017-10-21 ENCOUNTER — Other Ambulatory Visit: Payer: Self-pay | Admitting: Internal Medicine

## 2017-10-23 ENCOUNTER — Telehealth: Payer: Self-pay | Admitting: Internal Medicine

## 2017-10-23 NOTE — Telephone Encounter (Signed)
Refill request

## 2017-10-23 NOTE — Telephone Encounter (Signed)
Rx was requested by pharmacy earlier today. Rx was printed and is awaiting to be signed my MD.

## 2017-10-23 NOTE — Telephone Encounter (Signed)
Copied from [place]. Topic: Quick Communication - Rx Refill/Question >> Oct 23, 2017 11:03 AM Yvette Rack wrote: Medication: traMADol (ULTRAM) 50 MG tablet   Has the patient contacted their pharmacy? Yes.     (Agent: If no, request that the patient contact the pharmacy for the refill.)   Preferred Pharmacy (with phone number or street name): [place], [place] - [address] [phone] (Phone) [phone] (Fax)     Agent: Please be advised that RX refills may take up to 3 business days. We ask that you follow-up with your pharmacy.

## 2017-10-26 ENCOUNTER — Other Ambulatory Visit: Payer: Self-pay | Admitting: Internal Medicine

## 2017-10-26 NOTE — Telephone Encounter (Signed)
Copied from [place] [id number]. Topic: General - Other >> Oct 26, 2017 10:21 AM Lolita Rieger, RMA wrote: Reason for CRM: Darl Householder from [facility] in [place] called and stated that the rx for pt tramadol was not received and would like for the offie to fax it in or have someone call this in for the pt [phone] is the fax

## 2017-10-27 NOTE — Telephone Encounter (Signed)
rx was re-faxed on 10/27/17 at 0737. conformation received.

## 2017-10-28 DIAGNOSIS — L905 Scar conditions and fibrosis of skin: Secondary | ICD-10-CM | POA: Diagnosis not present

## 2017-10-28 DIAGNOSIS — C4441 Basal cell carcinoma of skin of scalp and neck: Secondary | ICD-10-CM | POA: Diagnosis not present

## 2017-11-10 ENCOUNTER — Other Ambulatory Visit: Payer: Self-pay | Admitting: Internal Medicine

## 2018-01-20 DIAGNOSIS — H5203 Hypermetropia, bilateral: Secondary | ICD-10-CM | POA: Diagnosis not present

## 2018-02-26 ENCOUNTER — Other Ambulatory Visit: Payer: Self-pay

## 2018-03-03 ENCOUNTER — Other Ambulatory Visit: Payer: Self-pay

## 2018-03-03 MED ORDER — TRAMADOL HCL 50 MG PO TABS: 50.0000 mg | ORAL_TABLET | Freq: Two times a day (BID) | ORAL | 0 refills | Status: DC | PRN

## 2018-03-09 ENCOUNTER — Telehealth: Payer: Self-pay | Admitting: Internal Medicine

## 2018-03-09 NOTE — Telephone Encounter (Unsigned)
Copied from [place] [phone]. Topic: Quick Communication - See Telephone Encounter >> Mar 09, 2018 12:51 PM Neva Seat wrote: traMADol (ULTRAM) 50 MG tablet  7 days at a time. Pt was getting the quantity of 90 days.  Pharmacy is telling pt that she needs to have Dr. Raliegh Ip to write the Rx for chronical pain to receive more than a 7 day supply. Please call pt to discuss.

## 2018-03-11 NOTE — Telephone Encounter (Signed)
Pt is wanting more than 7 tabs of this Rx. 7 tabs have been administered due to not having a Dx code attached stating why the pt is taking this prescription and see if the insurance will pay for it.

## 2018-03-11 NOTE — Telephone Encounter (Signed)
Please advise 

## 2018-03-12 NOTE — Telephone Encounter (Signed)
The pharmacist just needs a Dx code for why the pt is taking tramadol not an approval. I tried looking through progress notes but could not find anything.

## 2018-03-12 NOTE — Telephone Encounter (Signed)
OK #[phone]

## 2018-03-12 NOTE — Telephone Encounter (Signed)
Could you provide the Dx code Thanks!

## 2018-03-15 NOTE — Telephone Encounter (Signed)
Noted! Will informed the pharmacist at [facility]

## 2018-03-15 NOTE — Telephone Encounter (Signed)
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

## 2018-03-16 ENCOUNTER — Ambulatory Visit (INDEPENDENT_AMBULATORY_CARE_PROVIDER_SITE_OTHER): Payer: Medicare HMO | Admitting: Internal Medicine

## 2018-03-16 ENCOUNTER — Encounter: Payer: Self-pay | Admitting: Internal Medicine

## 2018-03-16 ENCOUNTER — Ambulatory Visit: Payer: PPO | Admitting: Internal Medicine

## 2018-03-16 VITALS — BP 122/60 | HR 71 | Temp 98.1°F | Wt 141.0 lb

## 2018-03-16 DIAGNOSIS — R531 Weakness: Secondary | ICD-10-CM | POA: Diagnosis not present

## 2018-03-16 DIAGNOSIS — J449 Chronic obstructive pulmonary disease, unspecified: Secondary | ICD-10-CM

## 2018-03-16 DIAGNOSIS — G9009 Other idiopathic peripheral autonomic neuropathy: Secondary | ICD-10-CM

## 2018-03-16 LAB — COMPREHENSIVE METABOLIC PANEL
ALT: 14 U/L (ref 0–35)
AST: 17 U/L (ref 0–37)
Albumin: 4.2 g/dL (ref 3.5–5.2)
Alkaline Phosphatase: 68 U/L (ref 39–117)
BUN: 21 mg/dL (ref 6–23)
CO2: 29 mEq/L (ref 19–32)
Calcium: 10.5 mg/dL (ref 8.4–10.5)
Chloride: 103 mEq/L (ref 96–112)
Creatinine, Ser: 0.79 mg/dL (ref 0.40–1.20)
GFR: 75.66 mL/min (ref >60.00)
GLUCOSE: 114 mg/dL — AB (ref 70–99)
POTASSIUM: 4.7 meq/L (ref 3.5–5.1)
SODIUM: 139 meq/L (ref 135–145)
Total Bilirubin: 0.7 mg/dL (ref 0.2–1.2)
Total Protein: 6.8 g/dL (ref 6.0–8.3)

## 2018-03-16 LAB — CBC WITH DIFFERENTIAL/PLATELET
BASOS PCT: 1.2 % (ref 0.0–3.0)
Basophils Absolute: 0.1 10*3/uL (ref 0.0–0.1)
EOS PCT: 3.8 % (ref 0.0–5.0)
Eosinophils Absolute: 0.3 10*3/uL (ref 0.0–0.7)
HCT: 34.5 % — ABNORMAL LOW (ref 36.0–46.0)
Hemoglobin: 11.7 g/dL — ABNORMAL LOW (ref 12.0–15.0)
LYMPHS ABS: 2.7 10*3/uL (ref 0.7–4.0)
Lymphocytes Relative: 35.8 % (ref 12.0–46.0)
MCHC: 33.8 g/dL (ref 30.0–36.0)
MCV: 90.3 fl (ref 78.0–100.0)
MONO ABS: 0.6 10*3/uL (ref 0.1–1.0)
MONOS PCT: 8.2 % (ref 3.0–12.0)
NEUTROS ABS: 3.8 10*3/uL (ref 1.4–7.7)
NEUTROS PCT: 51 % (ref 43.0–77.0)
PLATELETS: 220 10*3/uL (ref 150.0–400.0)
RBC: 3.82 Mil/uL — AB (ref 3.87–5.11)
RDW: 13.7 % (ref 11.5–15.5)
WBC: 7.4 10*3/uL (ref 4.0–10.5)

## 2018-03-16 LAB — TSH: TSH: 1.87 u[IU]/mL (ref 0.35–4.50)

## 2018-03-16 MED ORDER — GABAPENTIN 100 MG PO CAPS: 100.0000 mg | ORAL_CAPSULE | Freq: Two times a day (BID) | ORAL | 3 refills | Status: DC

## 2018-03-16 NOTE — Patient Instructions (Signed)
Gabapentin 100 mg at bedtime for 2 weeks, then increase dose to 100 mg twice daily  Follow-up in 3 months or as needed Hold tramadol

## 2018-03-16 NOTE — Progress Notes (Signed)
Subjective:    [name], female    DOB: April 05, 1946, 72 y.o.   MRN: [id number]  HPI  71 year old patient who has a history of stable COPD as well as a history of idiopathic peripheral neuropathy. She has been on tramadol for pain control for some time.  Last week she took a tramadol just before retiring to bed and awoke at 3 AM quite weak nauseated with vomiting and diaphoresis symptoms lasted a couple of hours.  She states since this episode at times she still feels anxious and a bit tremulous.  Her pulmonary status has been stable.  She has not taken any more tramadol since this event  Past Medical History:  Diagnosis Date  . CHEST PAIN 11/20/2008  . COLONIC POLYPS, HX OF 05/24/2007  . Diverticular disease   . DYSPNEA ON EXERTION 12/19/2008  . HYPERCHOLESTEROLEMIA 09/18/2008  . 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 Peripheral neuropathy   . POSTMENOPAUSAL SYNDROME 04/12/2008  . 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 Marital status: Married    Spouse name: Not on file  . Number of children: Not on file  . Years of education: Not on file  . Highest education level: Not on file  Occupational History  . Occupation: retired  Scientific laboratory technician  . Financial resource strain: Not on file  . Food insecurity:    Worry: Not on file    Inability: Not on file  . Transportation needs:    Medical: Not on file    Non-medical: Not on file  Tobacco Use  . Smoking status: Former Smoker    Packs/day: 1.00    Years: 53.00    Pack years: 53.00    Types: Cigarettes    Last attempt to quit: 10/13/1996    Years since quitting: 21.4  . Smokeless tobacco: Never Used  . Tobacco comment: smoked 1-1.5 PPD   Substance and Sexual Activity  . Alcohol use: No    Alcohol/week: 0.0 oz  . Drug use: No  . Sexual activity: Not on file  Lifestyle  . Physical activity:    Days per week: Not on file    Minutes per session: Not on file  .  Stress: Not on file  Relationships  . Social connections:    Talks on phone: Not on file    Gets together: Not on file    Attends religious service: Not on file    Active member of club or organization: Not on file    Attends meetings of clubs or organizations: Not on file    Relationship status: Not on file  . Intimate partner violence:    Fear of current or ex partner: Not on file    Emotionally abused: Not on file    Physically abused: Not on file    Forced sexual activity: Not on file  Other Topics Concern  . Not on file  Social History Narrative  . Not on file    Past Surgical History:  Procedure Laterality Date  . ABDOMINAL HYSTERECTOMY    . LUMBAR LAMINECTOMY    . NASAL SINUS SURGERY    . SHOULDER SURGERY      Family History  Problem Relation Age of Onset  . Cancer Mother        died of lung Ca  . COPD Father   . Heart disease Father        died of MI  .  Cancer Brother        bladder Ca  . COPD Brother     Allergies  Allergen Reactions  . Sulfonamide Derivatives     REACTION: headache    Current Outpatient Medications on File Prior to Visit  Medication Sig Dispense Refill  . Cholecalciferol (VITAMIN D3) 1000 units CAPS Take 1 capsule by mouth daily.    Marland Kitchen guaiFENesin (MUCINEX) 600 MG 12 hr tablet Take 600-1,200 mg by mouth 2 (two) times daily.    . simvastatin (ZOCOR) 40 MG tablet TAKE 1 TABLET BY MOUTH ONCE DAILY 90 tablet 3  . SPIRIVA RESPIMAT 2.5 MCG/ACT AERS USE 2 PUFFS EVERY DAY 8 g 11  . traMADol (ULTRAM) 50 MG tablet Take 1 tablet (50 mg total) by mouth 2 (two) times daily as needed. for pain 90 tablet 0  . triamcinolone (NASACORT ALLERGY 24HR) 55 MCG/ACT AERO nasal inhaler Place 2 sprays into the nose daily. 1 Inhaler 12  . VENTOLIN HFA 108 (90 Base) MCG/ACT inhaler INHALE TWO PUFFS BY MOUTH EVERY 6 HOURS AS NEEDED 18 each 3   No current facility-administered medications on file prior to visit.     BP 122/60 (BP Location: Right Arm, Patient  Position: Sitting, Cuff Size: Large)   Pulse 71   Temp 98.1 F (36.7 C) (Oral)   Wt 141 lb (64 kg)   SpO2 98%   BMI 24.98 kg/m     Review of Systems  Constitutional: Positive for diaphoresis and fatigue.  HENT: Negative for congestion, dental problem, hearing loss, rhinorrhea, sinus pressure, sore throat and tinnitus.   Eyes: Negative for pain, discharge and visual disturbance.  Respiratory: Negative for cough and shortness of breath.   Cardiovascular: Negative for chest pain, palpitations and leg swelling.  Gastrointestinal: Positive for nausea and vomiting. Negative for abdominal distention, abdominal pain, blood in stool, constipation and diarrhea.  Genitourinary: Negative for difficulty urinating, dysuria, flank pain, frequency, hematuria, pelvic pain, urgency, vaginal bleeding, vaginal discharge and vaginal pain.  Musculoskeletal: Negative for arthralgias, gait problem and joint swelling.  Skin: Negative for rash.  Neurological: Negative for dizziness, syncope, speech difficulty, weakness, numbness and headaches.  Hematological: Negative for adenopathy.  Psychiatric/Behavioral: Negative for agitation, behavioral problems and dysphoric mood. The patient is not nervous/anxious.        Objective:   Physical Exam  Constitutional: She is oriented to person, place, and time. She appears well-developed and well-nourished.  HENT:  Head: Normocephalic.  Right Ear: External ear normal.  Left Ear: External ear normal.  Mouth/Throat: Oropharynx is clear and moist.  Eyes: Pupils are equal, round, and reactive to light. Conjunctivae and EOM are normal.  Neck: Normal range of motion. Neck supple. No thyromegaly present.  Cardiovascular: Normal rate, regular rhythm, normal heart sounds and intact distal pulses.  No tachycardia  Pulmonary/Chest: Effort normal and breath sounds normal.  Abdominal: Soft. Bowel sounds are normal. She exhibits no mass. There is no tenderness.    Musculoskeletal: Normal range of motion.  Lymphadenopathy:    She has no cervical adenopathy.  Neurological: She is alert and oriented to person, place, and time.  Skin: Skin is warm and dry. No rash noted.  Psychiatric: She has a normal mood and affect. Her behavior is normal.          Assessment & Plan:   Episode of nausea vomiting weakness and diaphoresis.  Unclear whether this is an adverse drug effect from tramadol.  She has been on this medication chronically We will  check some updated lab Continue to hold tramadol Patient has never been on any prophylactic treatment for the peripheral neuropathy.  Will give a trial of gabapentin 100 mg at bedtime for 2 weeks and then 100 mg twice daily  [name]

## 2018-03-17 ENCOUNTER — Other Ambulatory Visit: Payer: Self-pay

## 2018-03-17 ENCOUNTER — Telehealth: Payer: Self-pay | Admitting: Family Medicine

## 2018-03-17 MED ORDER — TRAMADOL HCL 50 MG PO TABS: 50.0000 mg | ORAL_TABLET | Freq: Two times a day (BID) | ORAL | 0 refills | Status: DC | PRN

## 2018-03-17 NOTE — Telephone Encounter (Signed)
Copied from [place] [id number]. Topic: General - Call Back - No Documentation >> Mar 17, 2018 11:28 AM Nils Flack wrote: Reason for CRM: pt returning call no crm  Please call back

## 2018-03-18 ENCOUNTER — Ambulatory Visit: Payer: PPO | Admitting: Internal Medicine

## 2018-03-19 NOTE — Telephone Encounter (Signed)
Pt was informed of update. Pt informed me that Dr.Kwiatkowski is holding the Tramadol for 3 months. No further action needed.

## 2018-03-19 NOTE — Telephone Encounter (Signed)
Spoke with the pharmacist and they stated that they needed a Dx code some days ago. When I called they also said that the Rx needed to be electronically sent in or phoned in due to the condition of the Rx when its faxed. Dx code given. Pt can only have 30 or 60 tablets at a time.

## 2018-03-26 ENCOUNTER — Other Ambulatory Visit: Payer: Self-pay

## 2018-03-26 MED ORDER — TIOTROPIUM BROMIDE MONOHYDRATE 2.5 MCG/ACT IN AERS: INHALATION_SPRAY | RESPIRATORY_TRACT | 11 refills | Status: DC

## 2018-04-02 ENCOUNTER — Other Ambulatory Visit: Payer: Self-pay

## 2018-04-02 MED ORDER — SIMVASTATIN 40 MG PO TABS: 40.0000 mg | ORAL_TABLET | Freq: Every day | ORAL | 0 refills | Status: DC

## 2018-04-19 ENCOUNTER — Other Ambulatory Visit: Payer: Self-pay | Admitting: Internal Medicine

## 2018-04-27 ENCOUNTER — Ambulatory Visit (INDEPENDENT_AMBULATORY_CARE_PROVIDER_SITE_OTHER): Payer: Medicare HMO | Admitting: Internal Medicine

## 2018-04-27 ENCOUNTER — Encounter: Payer: Self-pay | Admitting: Internal Medicine

## 2018-04-27 ENCOUNTER — Telehealth: Payer: Self-pay

## 2018-04-27 VITALS — BP 120/50 | HR 68 | Temp 98.0°F | Wt 143.2 lb

## 2018-04-27 DIAGNOSIS — G9009 Other idiopathic peripheral autonomic neuropathy: Secondary | ICD-10-CM | POA: Diagnosis not present

## 2018-04-27 DIAGNOSIS — D500 Iron deficiency anemia secondary to blood loss (chronic): Secondary | ICD-10-CM

## 2018-04-27 LAB — CBC WITH DIFFERENTIAL/PLATELET
Basophils Absolute: 0.1 10*3/uL (ref 0.0–0.1)
Basophils Relative: 1.2 % (ref 0.0–3.0)
EOS PCT: 3.8 % (ref 0.0–5.0)
Eosinophils Absolute: 0.3 10*3/uL (ref 0.0–0.7)
HCT: 34.5 % — ABNORMAL LOW (ref 36.0–46.0)
Hemoglobin: 11.5 g/dL — ABNORMAL LOW (ref 12.0–15.0)
LYMPHS ABS: 2.5 10*3/uL (ref 0.7–4.0)
Lymphocytes Relative: 35 % (ref 12.0–46.0)
MCHC: 33.3 g/dL (ref 30.0–36.0)
MCV: 91 fl (ref 78.0–100.0)
MONOS PCT: 8.8 % (ref 3.0–12.0)
Monocytes Absolute: 0.6 10*3/uL (ref 0.1–1.0)
NEUTROS ABS: 3.7 10*3/uL (ref 1.4–7.7)
NEUTROS PCT: 51.2 % (ref 43.0–77.0)
PLATELETS: 225 10*3/uL (ref 150.0–400.0)
RBC: 3.78 Mil/uL — ABNORMAL LOW (ref 3.87–5.11)
RDW: 13.7 % (ref 11.5–15.5)
WBC: 7.3 10*3/uL (ref 4.0–10.5)

## 2018-04-27 LAB — FERRITIN: Ferritin: 114.3 ng/mL (ref 10.0–291.0)

## 2018-04-27 NOTE — Telephone Encounter (Signed)
Please advise 

## 2018-04-27 NOTE — Progress Notes (Signed)
Subjective:    [name], female    DOB: [birth year], 72 y.o.   MRN: [id number]  HPI  72 year old patient who is seen today for follow-up.  She was seen 1 month ago with acute episode of nausea vomiting weakness and diaphoresis after using tramadol.  The symptoms have not reoccurred.  Laboratory screen revealed very mild anemia and she is seen today for follow-up She does have history of COPD which has been quite stable. She generally feels well today. Colonoscopy 2011 No change in her bowel habits  Past Medical History:  Diagnosis Date  . CHEST PAIN 11/20/2008  . COLONIC POLYPS, HX OF 05/24/2007  . Diverticular disease   . DYSPNEA ON EXERTION 12/19/2008  . HYPERCHOLESTEROLEMIA 09/18/2008  . 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 Peripheral neuropathy   . POSTMENOPAUSAL SYNDROME 04/12/2008  . 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 Marital status: Married    Spouse name: Not on file  . Number of children: Not on file  . Years of education: Not on file  . Highest education level: Not on file  Occupational History  . Occupation: retired  Scientific laboratory technician  . Financial resource strain: Not on file  . Food insecurity:    Worry: Not on file    Inability: Not on file  . Transportation needs:    Medical: Not on file    Non-medical: Not on file  Tobacco Use  . Smoking status: Former Smoker    Packs/day: 1.00    Years: 53.00    Pack years: 53.00    Types: Cigarettes    Last attempt to quit: 10/13/1996    Years since quitting: 21.5  . Smokeless tobacco: Never Used  . Tobacco comment: smoked 1-1.5 PPD   Substance and Sexual Activity  . Alcohol use: No    Alcohol/week: 0.0 oz  . Drug use: No  . Sexual activity: Not on file  Lifestyle  . Physical activity:    Days per week: Not on file    Minutes per session: Not on file  . Stress: Not on file  Relationships  . Social connections:    Talks on phone: Not on file     Gets together: Not on file    Attends religious service: Not on file    Active member of club or organization: Not on file    Attends meetings of clubs or organizations: Not on file    Relationship status: Not on file  . Intimate partner violence:    Fear of current or ex partner: Not on file    Emotionally abused: Not on file    Physically abused: Not on file    Forced sexual activity: Not on file  Other Topics Concern  . Not on file  Social History Narrative  . Not on file    Past Surgical History:  Procedure Laterality Date  . ABDOMINAL HYSTERECTOMY    . LUMBAR LAMINECTOMY    . NASAL SINUS SURGERY    . SHOULDER SURGERY      Family History  Problem Relation Age of Onset  . Cancer Mother        died of lung Ca  . COPD Father   . Heart disease Father        died of MI  . Cancer Brother        bladder Ca  . COPD Brother     Allergies  Allergen Reactions  . Sulfonamide Derivatives     REACTION: headache    Current Outpatient Medications on File Prior to Visit  Medication Sig Dispense Refill  . Cholecalciferol (VITAMIN D3) 1000 units CAPS Take 1 capsule by mouth daily.    Marland Kitchen gabapentin (NEURONTIN) 100 MG capsule Take 1 capsule (100 mg total) by mouth 2 (two) times daily. 90 capsule 3  . simvastatin (ZOCOR) 40 MG tablet Take 1 tablet (40 mg total) by mouth daily. 90 tablet 0  . Tiotropium Bromide Monohydrate (SPIRIVA RESPIMAT) 2.5 MCG/ACT AERS USE 2 PUFFS EVERY DAY 8 g 11  . triamcinolone (NASACORT ALLERGY 24HR) 55 MCG/ACT AERO nasal inhaler Place 2 sprays into the nose daily. 1 Inhaler 12  . VENTOLIN HFA 108 (90 Base) MCG/ACT inhaler INHALE TWO PUFFS BY MOUTH EVERY 6 HOURS AS NEEDED 18 each 3   No current facility-administered medications on file prior to visit.     BP (!) 120/50 (BP Location: Right Arm, Patient Position: Sitting, Cuff Size: Large)   Pulse 68   Temp 98 F (36.7 C) (Oral)   Wt 143 lb 3.2 oz (65 kg)   SpO2 98%   BMI 25.37 kg/m     Review  of Systems  Constitutional: Negative.   HENT: Negative for congestion, dental problem, hearing loss, rhinorrhea, sinus pressure, sore throat and tinnitus.   Eyes: Negative for pain, discharge and visual disturbance.  Respiratory: Negative for cough and shortness of breath.   Cardiovascular: Negative for chest pain, palpitations and leg swelling.  Gastrointestinal: Negative for abdominal distention, abdominal pain, blood in stool, constipation, diarrhea, nausea and vomiting.  Genitourinary: Negative for difficulty urinating, dysuria, flank pain, frequency, hematuria, pelvic pain, urgency, vaginal bleeding, vaginal discharge and vaginal pain.  Musculoskeletal: Negative for arthralgias, gait problem and joint swelling.  Skin: Negative for rash.  Neurological: Negative for dizziness, syncope, speech difficulty, weakness, numbness and headaches.  Hematological: Negative for adenopathy.  Psychiatric/Behavioral: Negative for agitation, behavioral problems and dysphoric mood. The patient is not nervous/anxious.        Objective:   Physical Exam  Constitutional: She appears well-developed and well-nourished. No distress.  Cardiovascular: Normal rate and regular rhythm.  Pulmonary/Chest: Effort normal and breath sounds normal.  Abdominal: Soft. Bowel sounds are normal.          Assessment & Plan:  History of mild anemia.  Will repeat CBC with ferritin level.  Will check stool for occult blood x4 .  Patient does have a follow-up exam scheduled for next month COPD stable History of peripheral neuropathy.  Patient symptoms seem very mild.  She was given a prescription for gabapentin last month that she uses very sparingly  CPX next month as scheduled  Marletta Lor

## 2018-04-27 NOTE — Patient Instructions (Addendum)
Please return slides to check for hidden blood  Limit your sodium (Salt) intake    It is important that you exercise regularly, at least 20 minutes 3 to 4 times per week.  If you develop chest pain or shortness of breath seek  medical attention.   Return next month for your annual exam as scheduled

## 2018-04-27 NOTE — Telephone Encounter (Signed)
Copied from [place] [id number]. Topic: Appointment Scheduling - Scheduling Inquiry for Clinic >> Apr 27, 2018 12:35 PM Synthia Innocent wrote: Reason for CRM: Would like to know which provider DR K recommended to take over her care once he retires?

## 2018-04-28 NOTE — Telephone Encounter (Signed)
Dr. Rande Lawman at [facility]

## 2018-04-28 NOTE — Telephone Encounter (Signed)
Dr. Hernandez

## 2018-04-28 NOTE — Telephone Encounter (Signed)
Can you please inform me of the location and first name of doctor.  Thank you

## 2018-04-28 NOTE — Telephone Encounter (Signed)
Patient notified of update  and verbalized understanding. 

## 2018-05-03 ENCOUNTER — Other Ambulatory Visit (INDEPENDENT_AMBULATORY_CARE_PROVIDER_SITE_OTHER): Payer: Medicare HMO

## 2018-05-03 DIAGNOSIS — D500 Iron deficiency anemia secondary to blood loss (chronic): Secondary | ICD-10-CM

## 2018-05-03 LAB — HEMOCCULT GUIAC POC 1CARD (OFFICE): Fecal Occult Blood, POC: NEGATIVE

## 2018-05-20 ENCOUNTER — Encounter: Payer: Self-pay | Admitting: Internal Medicine

## 2018-05-20 ENCOUNTER — Ambulatory Visit (INDEPENDENT_AMBULATORY_CARE_PROVIDER_SITE_OTHER): Payer: Medicare HMO | Admitting: Internal Medicine

## 2018-05-20 VITALS — BP 122/60 | HR 66 | Temp 98.1°F | Ht 62.0 in | Wt 142.4 lb

## 2018-05-20 DIAGNOSIS — E7800 Pure hypercholesterolemia, unspecified: Secondary | ICD-10-CM | POA: Diagnosis not present

## 2018-05-20 DIAGNOSIS — J4520 Mild intermittent asthma, uncomplicated: Secondary | ICD-10-CM

## 2018-05-20 DIAGNOSIS — Z0000 Encounter for general adult medical examination without abnormal findings: Secondary | ICD-10-CM | POA: Diagnosis not present

## 2018-05-20 MED ORDER — SIMVASTATIN 40 MG PO TABS: 40.0000 mg | ORAL_TABLET | Freq: Every day | ORAL | 4 refills | Status: DC

## 2018-05-20 MED ORDER — TIOTROPIUM BROMIDE MONOHYDRATE 2.5 MCG/ACT IN AERS: INHALATION_SPRAY | RESPIRATORY_TRACT | 11 refills | Status: DC

## 2018-05-20 MED ORDER — GABAPENTIN 100 MG PO CAPS: 100.0000 mg | ORAL_CAPSULE | Freq: Two times a day (BID) | ORAL | 3 refills | Status: DC

## 2018-05-20 MED ORDER — TRIAMCINOLONE ACETONIDE 55 MCG/ACT NA AERO: 2.0000 | INHALATION_SPRAY | Freq: Every day | NASAL | 12 refills | Status: DC

## 2018-05-20 NOTE — Patient Instructions (Addendum)
Limit your sodium (Salt) intake    It is important that you exercise regularly, at least 20 minutes 3 to 4 times per week.  If you develop chest pain or shortness of breath seek  medical attention.  Take a calcium supplement, plus [phone] units of vitamin D  Schedule your mammogram.  Return in one year for follow-up

## 2018-05-20 NOTE — Progress Notes (Signed)
Subjective:    [name], female    DOB: January 02, 1946, 72 y.o.   MRN: [id number]  HPI  72 year old patient is seen today for an annual preventive health examination as well as a subsequent Medicare wellness visit  She continues to do well.  She has had a recent lab that revealed very mild anemia she has had a colonoscopy in 2011.  Ferritin level was normal and stool for occult blood also normal  She has a history of COPD which has been quite stable on Spiriva.  No albuterol rescue use  Last mammogram October 2017  Allergies:  1) ! Sulfa   Past History:  Past Medical History:   Colonic polyps, hx of  Mild Hypercholesterolemia  complex diverticular disease  peripheral neuropathy takes Neurontin only sporadically Tinnitus  remote history of asthma   Past Surgical History:  Hysterectomy 96  Lumbar laminectomy 01  Nasal sx  Right shoulder sx  Colonoscopy-09/07/2004, 2011 (hyperplastic polyp) 1996 history of diverticular abscess   Family History:   Fam hx MI  Family History Diabetes 1st degree relative  Family History Lung cancer  Fam hx COPD  father died of an MI at age 4. History of COPD  mother died of lung cancer at 29  One brother, COPD, bladder Ca (deceased)  Three sisters, one died at 25 weeks of age  one sister died at 38 complications of diabetes   Social History:   Married  discontinue smoking 15 years ago Son died age 29 massive MI   Past Medical History:  Diagnosis Date  . CHEST PAIN 11/20/2008  . COLONIC POLYPS, HX OF 05/24/2007  . Diverticular disease   . DYSPNEA ON EXERTION 12/19/2008  . HYPERCHOLESTEROLEMIA 09/18/2008  . 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 Peripheral neuropathy   . POSTMENOPAUSAL SYNDROME 04/12/2008  . 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 Marital status: Married    Spouse name: Not on file  . Number of children: Not on file  . Years of education:  Not on file  . Highest education level: Not on file  Occupational History  . Occupation: retired  Scientific laboratory technician  . Financial resource strain: Not on file  . Food insecurity:    Worry: Not on file    Inability: Not on file  . Transportation needs:    Medical: Not on file    Non-medical: Not on file  Tobacco Use  . Smoking status: Former Smoker    Packs/day: 1.00    Years: 53.00    Pack years: 53.00    Types: Cigarettes    Last attempt to quit: 10/13/1996    Years since quitting: 21.6  . Smokeless tobacco: Never Used  . Tobacco comment: smoked 1-1.5 PPD   Substance and Sexual Activity  . Alcohol use: No    Alcohol/week: 0.0 standard drinks  . Drug use: No  . Sexual activity: Not on file  Lifestyle  . Physical activity:    Days per week: Not on file    Minutes per session: Not on file  . Stress: Not on file  Relationships  . Social connections:    Talks on phone: Not on file    Gets together: Not on file    Attends religious service: Not on file    Active member of club or organization: Not on file    Attends meetings of clubs or organizations: Not on file  Relationship status: Not on file  . Intimate partner violence:    Fear of current or ex partner: Not on file    Emotionally abused: Not on file    Physically abused: Not on file    Forced sexual activity: Not on file  Other Topics Concern  . Not on file  Social History Narrative  . Not on file    Past Surgical History:  Procedure Laterality Date  . ABDOMINAL HYSTERECTOMY    . LUMBAR LAMINECTOMY    . NASAL SINUS SURGERY    . SHOULDER SURGERY      Family History  Problem Relation Age of Onset  . Cancer Mother        died of lung Ca  . COPD Father   . Heart disease Father        died of MI  . Cancer Brother        bladder Ca  . COPD Brother     Allergies  Allergen Reactions  . Sulfonamide Derivatives     REACTION: headache    Current Outpatient Medications on File Prior to Visit  Medication  Sig Dispense Refill  . Cholecalciferol (VITAMIN D3) 1000 units CAPS Take 1 capsule by mouth daily.    . VENTOLIN HFA 108 (90 Base) MCG/ACT inhaler INHALE TWO PUFFS BY MOUTH EVERY 6 HOURS AS NEEDED 18 each 3   No current facility-administered medications on file prior to visit.     BP 122/60 (BP Location: Right Arm, Patient Position: Sitting, Cuff Size: Large)   Pulse 66   Temp 98.1 F (36.7 C) (Oral)   Ht 5\' 2"  (1.575 m)   Wt 142 lb 6.4 oz (64.6 kg)   SpO2 96%   BMI 26.05 kg/m   Subsequent Medicare wellness visit  1. Risk factors, based on past  M,S,F history.  [place] risk factors include a history of dyslipidemia  2.  Physical activities: Remains very active with gardening and walking almost daily  3.  Depression/mood: No history of major depression or mood disorder  4.  Hearing: No deficits.  History of tinnitus in the past  5.  ADL's: Independent  6.  Fall risk: Low.  No falls over the past year  7.  Home safety: No issues identified  8.  Height weight, and visual acuity; height and weight stable no change in visual acuity  9.  Counseling: Continue heart healthy diet and regular exercise  10. Lab orders based on risk factors: Laboratory studies reviewed unremarkable except for borderline anemia  11. Referral : Mammogram encouraged  12. Care plan: Continue  efforts at aggressive risk factor modification  13. Cognitive assessment:   14. Screening: Patient provided with a written and personalized 5-10 year screening schedule in the AVS.    15. Provider List Update: Ophthalmology GI primary care pulmonary medicine    Review of Systems  Constitutional: Negative.   HENT: Negative for congestion, dental problem, hearing loss, rhinorrhea, sinus pressure, sore throat and tinnitus.   Eyes: Negative for pain, discharge and visual disturbance.  Respiratory: Negative for cough and shortness of breath.   Cardiovascular: Negative for chest pain, palpitations and leg  swelling.  Gastrointestinal: Negative for abdominal distention, abdominal pain, blood in stool, constipation, diarrhea, nausea and vomiting.  Genitourinary: Negative for difficulty urinating, dysuria, flank pain, frequency, hematuria, pelvic pain, urgency, vaginal bleeding, vaginal discharge and vaginal pain.  Musculoskeletal: Negative for arthralgias, gait problem and joint swelling.  Skin: Negative for rash.  Neurological: Negative for  dizziness, syncope, speech difficulty, weakness, numbness and headaches.  Hematological: Negative for adenopathy.  Psychiatric/Behavioral: Negative for agitation, behavioral problems and dysphoric mood. The patient is not nervous/anxious.        Objective:   Physical Exam  Constitutional: She is oriented to person, place, and time. She appears well-developed and well-nourished.  Blood pressure 120/70  HENT:  Head: Normocephalic and atraumatic.  Right Ear: External ear normal.  Left Ear: External ear normal.  Mouth/Throat: Oropharynx is clear and moist.  Eyes: Conjunctivae and EOM are normal.  Neck: Normal range of motion. Neck supple. No JVD present. No thyromegaly present.  Surgical scar right lateral neck  Cardiovascular: Normal rate, regular rhythm, normal heart sounds and intact distal pulses.  No murmur heard. Dorsalis pedis pulses full.  Posterior tibial pulses +1  Pulmonary/Chest: Effort normal and breath sounds normal. She has no wheezes. She has no rales.  Abdominal: Soft. Bowel sounds are normal. She exhibits no distension and no mass. There is no tenderness. There is no rebound and no guarding.  Lower midline scar  Genitourinary: Vagina normal.  Musculoskeletal: Normal range of motion. She exhibits no edema or tenderness.  Trace left ankle edema  Neurological: She is alert and oriented to person, place, and time. She has normal reflexes. She displays normal reflexes. No cranial nerve deficit. She exhibits normal muscle tone. Coordination  normal.  Skin: Skin is warm and dry. No rash noted.  Psychiatric: She has a normal mood and affect. Her behavior is normal.          Assessment & Plan:   Preventive health examination Subsequent Medicare wellness visit COPD.  Clinically stable Dyslipidemia continue statin therapy History of peripheral neuropathy.  This has been very mild and stable.  Patient takes Neurontin infrequently  Mammogram encouraged All medications updated Follow-up 1 year or as needed  Marletta Lor

## 2018-05-22 ENCOUNTER — Encounter: Payer: Self-pay | Admitting: Internal Medicine

## 2018-05-22 DIAGNOSIS — Z1231 Encounter for screening mammogram for malignant neoplasm of breast: Secondary | ICD-10-CM | POA: Diagnosis not present

## 2018-06-02 ENCOUNTER — Ambulatory Visit (INDEPENDENT_AMBULATORY_CARE_PROVIDER_SITE_OTHER): Payer: Medicare HMO | Admitting: Internal Medicine

## 2018-06-02 ENCOUNTER — Encounter: Payer: Self-pay | Admitting: Internal Medicine

## 2018-06-02 VITALS — BP 122/60 | HR 72 | Temp 98.1°F | Wt 142.8 lb

## 2018-06-02 DIAGNOSIS — J4520 Mild intermittent asthma, uncomplicated: Secondary | ICD-10-CM

## 2018-06-02 NOTE — Patient Instructions (Signed)
Best Wishes

## 2018-06-02 NOTE — Progress Notes (Signed)
   Subjective:    [name], female    DOB: 03-10-1946, 72 y.o.   MRN: [id number]  HPI  72 year old patient who was seen recently for a annual evaluation.  She had some concerns about a recent mammogram.  Mammogram was largely unremarkable except for dense breasts  She has a history of asthma which has been stable    Review of Systems     Objective:   Physical Exam        Assessment & Plan:   Low risk mammogram.  Results discussed with patient and patient reassured The patient will follow-up with a new provider in approximately 6 months  Marletta Lor

## 2018-08-04 ENCOUNTER — Other Ambulatory Visit: Payer: Self-pay

## 2018-08-05 ENCOUNTER — Other Ambulatory Visit: Payer: Self-pay | Admitting: Internal Medicine

## 2018-08-20 ENCOUNTER — Telehealth: Payer: Self-pay

## 2018-08-20 ENCOUNTER — Ambulatory Visit: Payer: Medicare HMO | Admitting: Family Medicine

## 2018-08-20 NOTE — Progress Notes (Deleted)
  Susan Collins DOB: 13-May-1946 Encounter date: 08/20/2018  This is a 72 y.o. female who presents with No chief complaint on file.   History of present illness:  HPI   Allergies  Allergen Reactions  . Sulfonamide Derivatives     REACTION: headache   No outpatient medications have been marked as taking for the 08/20/18 encounter (Appointment) with Caren Macadam, MD.    Review of Systems  Objective:  There were no vitals taken for this visit.      BP Readings from Last 3 Encounters:  06/02/18 122/60  05/20/18 122/60  04/27/18 (!) 120/50   Wt Readings from Last 3 Encounters:  06/02/18 142 lb 12.8 oz (64.8 kg)  05/20/18 142 lb 6.4 oz (64.6 kg)  04/27/18 143 lb 3.2 oz (65 kg)    Physical Exam  Assessment/Plan  There are no diagnoses linked to this encounter.       Micheline Rough, MD

## 2018-08-20 NOTE — Telephone Encounter (Signed)
She has appt scheduled with dr. Jerilee Hoh in December.

## 2018-08-20 NOTE — Telephone Encounter (Signed)
Will send to Dr. Koberlein as FYI 

## 2018-08-20 NOTE — Telephone Encounter (Signed)
Copied from [place] [id number]. Topic: Quick Communication - Appointment Cancellation >> Aug 20, 2018  8:01 AM Yvette Rack wrote: Patient called to cancel appointment scheduled for 08-20-18. Patient has not rescheduled their appointment.  Route to department's PEC pool. >> Aug 20, 2018 10:33 AM Virl Cagey, CMA wrote: Noted. Will send to Dr Ethlyn Gallery nurse as Juluis Rainier

## 2018-09-13 ENCOUNTER — Ambulatory Visit: Payer: Self-pay | Admitting: *Deleted

## 2018-09-13 ENCOUNTER — Encounter: Payer: Self-pay | Admitting: Internal Medicine

## 2018-09-13 ENCOUNTER — Ambulatory Visit (INDEPENDENT_AMBULATORY_CARE_PROVIDER_SITE_OTHER): Payer: Medicare HMO | Admitting: Internal Medicine

## 2018-09-13 VITALS — BP 124/56 | HR 77 | Temp 97.5°F | Wt 144.6 lb

## 2018-09-13 DIAGNOSIS — J441 Chronic obstructive pulmonary disease with (acute) exacerbation: Secondary | ICD-10-CM

## 2018-09-13 DIAGNOSIS — Z79899 Other long term (current) drug therapy: Secondary | ICD-10-CM | POA: Diagnosis not present

## 2018-09-13 DIAGNOSIS — J449 Chronic obstructive pulmonary disease, unspecified: Secondary | ICD-10-CM | POA: Diagnosis not present

## 2018-09-13 MED ORDER — PREDNISONE 20 MG PO TABS: 20.0000 mg | ORAL_TABLET | Freq: Two times a day (BID) | ORAL | 0 refills | Status: DC

## 2018-09-13 MED ORDER — ALBUTEROL SULFATE HFA 108 (90 BASE) MCG/ACT IN AERS: INHALATION_SPRAY | RESPIRATORY_TRACT | 3 refills | Status: DC

## 2018-09-13 MED ORDER — IPRATROPIUM-ALBUTEROL 0.5-2.5 (3) MG/3ML IN SOLN
3.0000 mL | Freq: Once | RESPIRATORY_TRACT | Status: AC
  Administered 2018-09-13: 3 mL via RESPIRATORY_TRACT

## 2018-09-13 NOTE — Telephone Encounter (Signed)
Pt has already been seen in office.  

## 2018-09-13 NOTE — Progress Notes (Signed)
Chief Complaint  Patient presents with  . COPD    COPD flare - increased SOB, wheezing and chest tightness. increased sx's with weather change.     HPI: Susan Collins 72 y.o. come in for  Add on sda  appt ( miss scheduled ) so seen as  Walk in  For  Asthma copd flare  Onset with    SOB and now wheezing   For 3 days  ( holiday weekend)   In transition to new PCP No longer on albuterol rescue  Case was doing so well . Takes spiriva daily    Has allergy to  Dust and  Ragweed.  No fever runy nose sneeze  Cough non productive .   No hx of   chf per   Susan Collins report   ROS: See pertinent positives and negatives per HPI. Last pfts 2016 mod  obst disease with good response to bronchodiltor .   Past Medical History:  Diagnosis Date  . CHEST PAIN 11/20/2008  . COLONIC POLYPS, HX OF 05/24/2007  . Diverticular disease   . DYSPNEA ON EXERTION 12/19/2008  . HYPERCHOLESTEROLEMIA 09/18/2008  . 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 Peripheral neuropathy   . POSTMENOPAUSAL SYNDROME 04/12/2008  . TINNITUS, 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    Family History  Problem Relation Age of Onset  . Cancer Mother        died of lung Ca  . COPD Father   . Heart disease Father        died of MI  . Cancer Brother        bladder Ca  . COPD Brother     Social History   Socioeconomic History  . Marital status: Married    Spouse name: Not on file  . Number of children: Not on file  . Years of education: Not on file  . Highest education level: Not on file  Occupational History  . Occupation: retired  Scientific laboratory technician  . Financial resource strain: Not on file  . Food insecurity:    Worry: Not on file    Inability: Not on file  . Transportation needs:    Medical: Not on file    Non-medical: Not on file  Tobacco Use  . Smoking status: Former Smoker    Packs/day: 1.00    Years: 53.00    Pack years: 53.00    Types: Cigarettes    Last attempt to quit: 10/13/1996    Years since quitting: 21.9  .  Smokeless tobacco: Never Used  . Tobacco comment: smoked 1-1.5 PPD   Substance and Sexual Activity  . Alcohol use: No    Alcohol/week: 0.0 standard drinks  . Drug use: No  . Sexual activity: Not on file  Lifestyle  . Physical activity:    Days per week: Not on file    Minutes per session: Not on file  . Stress: Not on file  Relationships  . Social connections:    Talks on phone: Not on file    Gets together: Not on file    Attends religious service: Not on file    Active member of club or organization: Not on file    Attends meetings of clubs or organizations: Not on file    Relationship status: Not on file  Other Topics Concern  . Not on file  Social History Narrative  . Not on file    Outpatient Medications Prior to Visit  Medication Sig Dispense Refill  . Cholecalciferol (  VITAMIN D3) 1000 units CAPS Take 1 capsule by mouth daily.    . simvastatin (ZOCOR) 40 MG tablet Take 1 tablet (40 mg total) by mouth daily. 90 tablet 4  . Tiotropium Bromide Monohydrate (SPIRIVA RESPIMAT) 2.5 MCG/ACT AERS USE 2 PUFFS EVERY DAY 8 g 11  . triamcinolone (NASACORT ALLERGY 24HR) 55 MCG/ACT AERO nasal inhaler Place 2 sprays into the nose daily. 1 Inhaler 12  . VENTOLIN HFA 108 (90 Base) MCG/ACT inhaler INHALE TWO PUFFS BY MOUTH EVERY 6 HOURS AS NEEDED 18 each 3  . gabapentin (NEURONTIN) 100 MG capsule Take 1 capsule (100 mg total) by mouth 2 (two) times daily. (Patient not taking: Reported on 09/13/2018) 90 capsule 3  . simvastatin (ZOCOR) 40 MG tablet TAKE 1 TABLET BY MOUTH ONCE DAILY (Patient not taking: Reported on 09/13/2018) 90 tablet 3   No facility-administered medications prior to visit.      EXAM:  BP (!) 124/56 (BP Location: Left Arm, Patient Position: Sitting, Cuff Size: Normal)   Pulse 77   Temp (!) 97.5 F (36.4 C) (Oral)   Wt 144 lb 9.6 oz (65.6 kg)   SpO2 96% Comment: after neb tx  BMI 26.45 kg/m   Body mass index is 26.45 kg/m.  GENERAL: vitals reviewed and listed  above, alert, oriented, appears well hydrated and in no acute distress midl dypsnea  And hard of hearing but color good and nol speecj  Pulse ox 95 no toxic and nl color  HEENT: atraumatic, conjunctiva  clear, no obvious abnormalities on inspection of external nose and ears OP : no lesion edema or exudate tm NAD  NECK: no obvious masses on inspection palpation  LUNGS: some dec bs =  ocass exp wheeze  After neb incresase  Air flow noted and  Pat feels better  CV: HRRR, no clubbing cyanosis or  peripheral edema nl cap refill  MS: moves all extremities without noticeable focal   PSYCH: pleasant and cooperative, no obvious depression or anxiety  BP Readings from Last 3 Encounters:  09/13/18 (!) 124/56  06/02/18 122/60  05/20/18 122/60    ASSESSMENT AND PLAN:  Discussed the following assessment and plan:  COPD with asthma (Susan Collins) - Plan: ipratropium-albuterol (DUONEB) 0.5-2.5 (3) MG/3ML nebulizer solution 3 mL  COPD exacerbation ([facility]) - Plan: ipratropium-albuterol (DUONEB) 0.5-2.5 (3) MG/3ML nebulizer solution 3 mL  response apparent to bronchodilator  Flare  Felt to be non infectious trigger    5 days prednisone  and use albuterol as needed for now  And keep  TOC visit with new pcp next week  .follow up with alarm sx   -Patient advised to return or notify health care team  if  new concerns arise.  Patient Instructions   I   agree this is an asthma copd flare   Will refill the albuterol  To use as needed this week and if  persistent or progressive fu with new pcp or me.   Adding prednisone  X 5 days today  To help this flare up. Susan Collins.  M.D.

## 2018-09-13 NOTE — Patient Instructions (Addendum)
I   agree this is an asthma copd flare   Will refill the albuterol  To use as needed this week and if  persistent or progressive fu with new pcp or me.   Adding prednisone  X 5 days today  To help this flare up. Marland Kitchen

## 2018-09-13 NOTE — Telephone Encounter (Signed)
Patient is calling to requets an inhaler- patient states she was doing so well with the asthma and COPD that she and Dr Raliegh Ip decided she could stop a lot of her inhalers. She states the change in the weather has caused her to have an asthma attack and she is having SOB- she states she needs a Rx for an inhaler. Call to office to see if they can see her for a possible breathing treatment and Rx for inhaler. They agree to see her. Appointment scheduled.  Reason for Disposition . [1] MODERATE difficulty breathing (e.g., speaks in phrases, SOB even at rest, pulse 100-120) AND [2] NEW-onset or WORSE than normal    Patient is calling to request an inhaler- she states she has asthma and does not have an inhaler. Call to office- they will see her today- appointment made.  Answer Assessment - Initial Assessment Questions 1. RESPIRATORY STATUS: "Describe your breathing?" (e.g., wheezing, shortness of breath, unable to speak, severe coughing)      SOB, hard to breath, wheezing, coughing 2. ONSET: "When did this breathing problem begin?"      Friday 3. PATTERN "Does the difficult breathing come and go, or has it been constant since it started?"      constant 4. SEVERITY: "How bad is your breathing?" (e.g., mild, moderate, severe)    - MILD: No SOB at rest, mild SOB with walking, speaks normally in sentences, can lay down, no retractions, pulse < 100.    - MODERATE: SOB at rest, SOB with minimal exertion and prefers to sit, cannot lie down flat, speaks in phrases, mild retractions, audible wheezing, pulse 100-120.    - SEVERE: Very SOB at rest, speaks in single words, struggling to breathe, sitting hunched forward, retractions, pulse > 120      Moderate 5. RECURRENT SYMPTOM: "Have you had difficulty breathing before?" If so, ask: "When was the last time?" and "What happened that time?"      Yes- asthma and COPD- she had stopped inhaler because she was doing so good- now with the weather change- she is having  trouble 6. CARDIAC HISTORY: "Do you have any history of heart disease?" (e.g., heart attack, angina, bypass surgery, angioplasty)      no 7. LUNG HISTORY: "Do you have any history of lung disease?"  (e.g., pulmonary embolus, asthma, emphysema)     Asthma,COPD 8. CAUSE: "What do you think is causing the breathing problem?"      Asthma-COPD 9. OTHER SYMPTOMS: "Do you have any other symptoms? (e.g., dizziness, runny nose, cough, chest pain, fever)     Cough 10. PREGNANCY: "Is there any chance you are pregnant?" "When was your last menstrual period?"       n/a 11. TRAVEL: "Have you traveled out of the country in the last month?" (e.g., travel history, exposures)       n/a  Protocols used: BREATHING DIFFICULTY-A-AH

## 2018-09-14 ENCOUNTER — Ambulatory Visit: Payer: Medicare HMO | Admitting: Internal Medicine

## 2018-09-22 ENCOUNTER — Encounter: Payer: Self-pay | Admitting: Internal Medicine

## 2018-09-22 ENCOUNTER — Ambulatory Visit (INDEPENDENT_AMBULATORY_CARE_PROVIDER_SITE_OTHER): Payer: Medicare HMO | Admitting: Internal Medicine

## 2018-09-22 VITALS — BP 130/70 | HR 78 | Temp 97.9°F | Ht 62.0 in | Wt 144.1 lb

## 2018-09-22 DIAGNOSIS — E785 Hyperlipidemia, unspecified: Secondary | ICD-10-CM | POA: Diagnosis not present

## 2018-09-22 DIAGNOSIS — J449 Chronic obstructive pulmonary disease, unspecified: Secondary | ICD-10-CM | POA: Diagnosis not present

## 2018-09-22 NOTE — Patient Instructions (Signed)
-  It was very nice meeting you today!  -Please continue your 5 days of prednisone as prescribed.  -I will see you back in 6 months for your annual physical.  Come in fasting to that visit as we will do blood work.  Please come see [place] sooner if you have any acute issues.

## 2018-09-22 NOTE — Progress Notes (Signed)
Established Patient Office Visit     CC/Reason for Visit: Establish care, follow-up on chronic medical conditions  HPI: Susan Collins is a 72 y.o. female who is coming in today for the above mentioned reasons.  She is past due for annual physical exam. 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  She was seen in this office on December 2 with flareup and was given a course of prednisone.  She has been doing well since.  She tells me that frequently cold weather/rain causes had a flareup.  She has no acute issues on today's visit.   Past Medical/Surgical History: Past Medical History:  Diagnosis Date  . CHEST PAIN 11/20/2008  . COLONIC POLYPS, HX OF 05/24/2007  . Diverticular disease   . DYSPNEA ON EXERTION 12/19/2008  . HYPERCHOLESTEROLEMIA 09/18/2008  . 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 Peripheral neuropathy   . POSTMENOPAUSAL SYNDROME 04/12/2008  . 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 ABDOMINAL HYSTERECTOMY    . LUMBAR LAMINECTOMY    . NASAL SINUS SURGERY    . SHOULDER SURGERY      Social History:  reports that she quit smoking about 21 years ago. Her smoking use included cigarettes. She has a 53.00 pack-year smoking history. She has never used smokeless tobacco. She reports that she does not drink alcohol or use drugs.  Allergies: Allergies  Allergen Reactions  . Sulfonamide Derivatives     REACTION: headache    Family History:  Family History  Problem Relation Age of Onset  . Cancer Mother        died of lung Ca  . COPD Father   . Heart disease Father        died of MI  . Cancer Brother        bladder Ca  . COPD Brother      Current Outpatient Medications:  .  albuterol (VENTOLIN HFA) 108 (90 Base) MCG/ACT inhaler, INHALE TWO PUFFS BY MOUTH EVERY 6 HOURS AS NEEDED, Disp: 18 each, Rfl: 3 .   Cholecalciferol (VITAMIN D3) 1000 units CAPS, Take 1 capsule by mouth daily., Disp: , Rfl:  .  predniSONE (DELTASONE) 20 MG tablet, Take 1 tablet (20 mg total) by mouth 2 (two) times daily with a meal., Disp: 10 tablet, Rfl: 0 .  simvastatin (ZOCOR) 40 MG tablet, Take 1 tablet (40 mg total) by mouth daily., Disp: 90 tablet, Rfl: 4 .  Tiotropium Bromide Monohydrate (SPIRIVA RESPIMAT) 2.5 MCG/ACT AERS, USE 2 PUFFS EVERY DAY, Disp: 8 g, Rfl: 11 .  triamcinolone (NASACORT ALLERGY 24HR) 55 MCG/ACT AERO nasal inhaler, Place 2 sprays into the nose daily., Disp: 1 Inhaler, Rfl: 12  Review of Systems:  Constitutional: Denies fever, chills, diaphoresis, appetite change and fatigue.  HEENT: Denies photophobia, eye pain, redness, hearing loss, ear pain, congestion, sore throat, rhinorrhea, sneezing, mouth sores, trouble swallowing, neck pain, neck stiffness and tinnitus.   Respiratory: Denies SOB, DOE, cough, chest tightness,  and wheezing.   Cardiovascular: Denies chest pain, palpitations and leg swelling.  Gastrointestinal: Denies nausea, vomiting, abdominal pain, diarrhea, constipation, blood in stool and abdominal distention.  Genitourinary: Denies dysuria, urgency, frequency, hematuria, flank pain and difficulty urinating.  Endocrine: Denies: hot or cold intolerance, sweats, changes in hair or nails, polyuria, polydipsia. Musculoskeletal: Denies myalgias, back pain, joint swelling, arthralgias and gait problem.  Skin: Denies  pallor, rash and wound.  Neurological: Denies dizziness, seizures, syncope, weakness, light-headedness, numbness and headaches.  Hematological: Denies adenopathy. Easy bruising, personal or family bleeding history  Psychiatric/Behavioral: Denies suicidal ideation, mood changes, confusion, nervousness, sleep disturbance and agitation    Physical Exam: Vitals:   09/22/18 0959  BP: 130/70  Pulse: 78  Temp: 97.9 F (36.6 C)  TempSrc: Oral  SpO2: 96%  Weight: 144 lb 1.6 oz  (65.4 kg)  Height: 5\' 2"  (1.575 m)    Body mass index is 26.36 kg/m.   Constitutional: NAD, calm, comfortable Eyes: PERRL, lids and conjunctivae normal ENMT: Mucous membranes are moist.  Neck: normal, supple, no masses, no thyromegaly Respiratory: clear to auscultation bilaterally, no wheezing, no crackles. Normal respiratory effort. No accessory muscle use.  Cardiovascular: Regular rate and rhythm, no murmurs / rubs / gallops. No extremity edema. 2+ pedal pulses. No carotid bruits.  Abdomen: no tenderness, no masses palpated. No hepatosplenomegaly. Bowel sounds positive.  Musculoskeletal: no clubbing / cyanosis. No joint deformity upper and lower extremities. Good ROM, no contractures. Normal muscle tone.  Skin: no rashes, lesions, ulcers. No induration Neurologic: Grossly intact and nonfocal.  Psychiatric: Normal judgment and insight. Alert and oriented x 3. Normal mood.    Impression and Plan:  COPD with asthma ([place]) -She is doing well on Spiriva and as needed albuterol, follows with pulmonary. -She was seen a week ago with an exacerbation and was given a course of prednisone which is close to completing.  Dyslipidemia -Last LDL in August 2018 was 83, she is currently on 40 mg of simvastatin.     Patient Instructions  -It was very nice meeting you today!  -Please continue your 5 days of prednisone as prescribed.  -I will see you back in 6 months for your annual physical.  Come in fasting to that visit as we will do blood work.  Please come see [place] sooner if you have any acute issues.     Lelon Frohlich, MD [facility]

## 2018-09-27 ENCOUNTER — Ambulatory Visit: Payer: Self-pay

## 2018-09-27 NOTE — Telephone Encounter (Signed)
Ret'd call to pt. re: request to refill Prednisone.  Stated she has 2 pills left, from her previous prescription.  Stated her breathing is much better since she has been taking this.  Denied any new or worsening symptoms.  Is asking if Dr. Jerilee Hoh can keep her on a daily dose? Advised will send note to Dr. Jerilee Hoh with her request.         Reason for Disposition . Caller requesting a NON-URGENT new prescription or refill and triager unable to refill per unit policy  Answer Assessment - Initial Assessment Questions 1. SYMPTOMS: "Do you have any symptoms?"     Denied any symptoms 2. SEVERITY: If symptoms are present, ask "Are they mild, moderate or severe?"    So new symptoms; breathing is better on Prednisone  Protocols used: MEDICATION QUESTION CALL-A-AH  Message from [name] sent at 09/27/2018 8:10 AM EST   Patient called to say that she is currently taking predniSONE (DELTASONE) 10 MG tablet twice a day and its helping her breathing. She would like Dr Jerilee Hoh to keep her on the predniSONE (DELTASONE) 10 MG tablet one daily. Please advise Ph# [phone]

## 2018-09-28 NOTE — Telephone Encounter (Signed)
I called the pt and informed her of the message below.  She stated she is feeling better at this time and will call back for an appt if needed.

## 2018-09-28 NOTE — Telephone Encounter (Signed)
Unfortunately, long-term prednisone use has many side effects so using it long-term would not be appropriate. If she is still having breathing difficulties, please schedule follow up to see me.

## 2018-10-11 ENCOUNTER — Ambulatory Visit: Payer: Self-pay | Admitting: *Deleted

## 2018-10-11 NOTE — Telephone Encounter (Signed)
Message from Nani Ravens sent at 10/11/2018 12:53 PM EST   Pt called in to schedule a ov to see her PCP. PCP is out of the office/no openings in office or other locations. Pt would like to know if provider/office could send in something to the pharmacy for her? Pt says that she has COPD and also she is asthmatic. Pt has a nebulizer machine. Pt says that she is having a time breathing. Pt doesn't sound to be indestress.       Returned call to patient regarding needing an appointment for medication to use in her nebulizer. She does not know what the name of the medicine is. She has has the machine for a while.  She does have a hx of asthma and COPD. She gets short of breath all the time. She is doing laundry now and is able to talk on the phone without difficulty. The cold and rainy weather affects her breathing. Her provider is out of the office until next week and she would like to wait before scheduling with another provider at the office. She also has a pulmonologist that she sees.  She would like to see the pulmonologist so will call and see for any availability. She will call back and schedule if she can not be seen this week.

## 2018-10-11 NOTE — Telephone Encounter (Signed)
noted 

## 2018-10-20 ENCOUNTER — Ambulatory Visit: Payer: Medicare HMO | Admitting: Pulmonary Disease

## 2018-10-20 ENCOUNTER — Ambulatory Visit (INDEPENDENT_AMBULATORY_CARE_PROVIDER_SITE_OTHER)
Admission: RE | Admit: 2018-10-20 | Discharge: 2018-10-20 | Disposition: A | Discharge status: Home / Self Care | Payer: Medicare HMO | Source: Ambulatory Visit | Attending: Pulmonary Disease | Admitting: Pulmonary Disease

## 2018-10-20 ENCOUNTER — Encounter: Payer: Self-pay | Admitting: Pulmonary Disease

## 2018-10-20 VITALS — BP 114/70 | HR 74 | Ht 62.0 in | Wt 147.6 lb

## 2018-10-20 DIAGNOSIS — R05 Cough: Secondary | ICD-10-CM | POA: Diagnosis not present

## 2018-10-20 DIAGNOSIS — J432 Centrilobular emphysema: Secondary | ICD-10-CM

## 2018-10-20 DIAGNOSIS — R0602 Shortness of breath: Secondary | ICD-10-CM | POA: Diagnosis not present

## 2018-10-20 MED ORDER — UMECLIDINIUM-VILANTEROL 62.5-25 MCG/INH IN AEPB: 1.0000 | INHALATION_SPRAY | Freq: Every day | RESPIRATORY_TRACT | 5 refills | Status: DC

## 2018-10-20 NOTE — Patient Instructions (Signed)
Chest xray today  Change to anoro one puff daily after you finish current script for spiriva  Follow up in 4 months

## 2018-10-20 NOTE — Progress Notes (Signed)
[facility]  Chief Complaint  Patient presents with  . Follow-up    Pt has increase SOB, very tired, and a dry cough. Pt is wheezing when talking and walking on some days.    Constitutional:  BP 114/70 (BP Location: Left Arm, Cuff Size: Normal)   Pulse 74   Ht 5\' 2"  (1.575 m)   Wt 147 lb 9.6 oz (67 kg)   SpO2 98%   BMI 27.00 kg/m   Past Medical History:  HLD, Neuropathy, Diverticulosis, Tinnitus, Diverticulosis, Colon polyps  Brief Summary:  Susan Collins is a 73 y.o. female former smoker with COPD/emphysema.  Was treated for an exacerbation in December.  She still has trouble with her breathing.  Feels like her air gets trapped and she can't take a deep breath.  Has dry cough and occasional wheeze.  Breathing worse in cold, wet weather.  Not having sinus congestion sore throat, gland swelling, chest pain, fever, leg swelling, hemoptysis, or joint swelling.  Uses ventolin few times per week and this helps.   Physical Exam:   Appearance - well kempt   ENMT - clear nasal mucosa, midline nasal  septum, no oral exudates, no LAN, trachea midline, wears dentures  Respiratory - normal chest wall, normal respiratory effort, no accessory muscle use, no wheeze/rales  CV - s1s2 regular rate and rhythm, no murmurs, no peripheral edema, radial pulses symmetric  GI - soft, non tender, no masses  Lymph - no adenopathy noted in neck and axillary areas  MSK - normal gait  Ext - no cyanosis, clubbing, or joint inflammation noted  Skin - no rashes, lesions, or ulcers  Neuro - normal strength, oriented x 3  Psych - normal mood and affect  Assessment/Plan:   COPD with emphysema. - she has progressive dyspnea which seems like progression of her COPD - will try changing her to anoro in place of spiriva once she finishes this script - will get chest xray today - continue prn ventolin   Patient Instructions  Chest xray today  Change to  anoro one puff daily after you finish current script for spiriva  Follow up in 4 months  TIme spent 27 minutes  Chesley Mires, MD [facility] Pager: [phone] 10/20/2018, 12:20 PM  Flow Sheet     Pulmonary tests:  PFT 03/21/15 >> FEV1 1.43 (66%), FEV1% 66, TLC 5.56 (113%), DLCO 52%, + BD  Medications:   Allergies as of 10/20/2018      Reactions   Sulfonamide Derivatives    REACTION: headache      Medication List       Accurate as of October 20, 2018 12:20 PM. Always use your most recent med list.        albuterol 108 (90 Base) MCG/ACT inhaler Commonly known as:  VENTOLIN HFA INHALE TWO PUFFS BY MOUTH EVERY 6 HOURS AS NEEDED   simvastatin 40 MG tablet Commonly known as:  ZOCOR Take 1 tablet (40 mg total) by mouth daily.   Tiotropium Bromide Monohydrate 2.5 MCG/ACT Aers Commonly known as:  SPIRIVA RESPIMAT USE 2 PUFFS EVERY DAY   triamcinolone 55 MCG/ACT Aero nasal inhaler Commonly known as:  NASACORT ALLERGY 24HR Place 2 sprays into the nose daily.   umeclidinium-vilanterol 62.5-25 MCG/INH Aepb Commonly known as:  ANORO ELLIPTA Inhale 1 puff into the lungs daily.   Vitamin D3 25 MCG (1000 UT) Caps Take 1 capsule by mouth daily.       Past  Surgical History:  She  has a past surgical history that includes Abdominal hysterectomy; Lumbar laminectomy; Nasal sinus surgery; and Shoulder surgery.  Family History:  Her family history includes COPD in her brother and father; Cancer in her brother and mother; Heart disease in her father.  Social History:  She  reports that she quit smoking about 22 years ago. Her smoking use included cigarettes. She has a 53.00 pack-year smoking history. She has never used smokeless tobacco. She reports that she does not drink alcohol or use drugs.

## 2018-10-22 ENCOUNTER — Encounter: Payer: Self-pay | Admitting: Internal Medicine

## 2018-10-22 ENCOUNTER — Ambulatory Visit (INDEPENDENT_AMBULATORY_CARE_PROVIDER_SITE_OTHER): Payer: Medicare HMO | Admitting: Internal Medicine

## 2018-10-22 VITALS — BP 120/70 | HR 70 | Temp 98.2°F | Wt 146.7 lb

## 2018-10-22 DIAGNOSIS — Z780 Asymptomatic menopausal state: Secondary | ICD-10-CM

## 2018-10-22 DIAGNOSIS — J449 Chronic obstructive pulmonary disease, unspecified: Secondary | ICD-10-CM | POA: Diagnosis not present

## 2018-10-22 DIAGNOSIS — Z13820 Encounter for screening for osteoporosis: Secondary | ICD-10-CM | POA: Diagnosis not present

## 2018-10-22 NOTE — Progress Notes (Signed)
Established Patient Office Visit     CC/Reason for Visit: COPD follow-up  HPI: Susan Collins is a 73 y.o. female who is coming in today for the above mentioned reasons. Past Medical History is significant for: COPD, hyperlipidemia.  She was seen in December to establish care with me and to follow-up on a COPD flareup that was treated with prednisone in the office.  Since she last saw me she saw her pulmonologist, Dr. Halford Chessman.  She has been having days with increased shortness of breath with minimal activity.  He decided to discontinue the Spiriva and start Anoro Ellipta.  She starts this new prescription next week.  She is requesting a DEXA scan.  She has no acute complaints today.   Past Medical/Surgical History: Past Medical History:  Diagnosis Date  . CHEST PAIN 11/20/2008  . COLONIC POLYPS, HX OF 05/24/2007  . Diverticular disease   . DYSPNEA ON EXERTION 12/19/2008  . HYPERCHOLESTEROLEMIA 09/18/2008  . 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 Peripheral neuropathy   . POSTMENOPAUSAL SYNDROME 04/12/2008  . 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 ABDOMINAL HYSTERECTOMY    . LUMBAR LAMINECTOMY    . NASAL SINUS SURGERY    . SHOULDER SURGERY      Social History:  reports that she quit smoking about 22 years ago. Her smoking use included cigarettes. She has a 53.00 pack-year smoking history. She has never used smokeless tobacco. She reports that she does not drink alcohol or use drugs.  Allergies: Allergies  Allergen Reactions  . Sulfonamide Derivatives     REACTION: headache    Family History:  Family History  Problem Relation Age of Onset  . Cancer Mother        died of lung Ca  . COPD Father   . Heart disease Father        died of MI  . Cancer Brother        bladder Ca  . COPD Brother      Current Outpatient Medications:  .  albuterol (VENTOLIN HFA) 108 (90 Base) MCG/ACT inhaler, INHALE TWO PUFFS BY  MOUTH EVERY 6 HOURS AS NEEDED, Disp: 18 each, Rfl: 3 .  Cholecalciferol (VITAMIN D3) 1000 units CAPS, Take 1 capsule by mouth daily., Disp: , Rfl:  .  simvastatin (ZOCOR) 40 MG tablet, Take 1 tablet (40 mg total) by mouth daily., Disp: 90 tablet, Rfl: 4 .  triamcinolone (NASACORT ALLERGY 24HR) 55 MCG/ACT AERO nasal inhaler, Place 2 sprays into the nose daily., Disp: 1 Inhaler, Rfl: 12 .  umeclidinium-vilanterol (ANORO ELLIPTA) 62.5-25 MCG/INH AEPB, Inhale 1 puff into the lungs daily., Disp: 1 each, Rfl: 5  Review of Systems:  Constitutional: Denies fever, chills, diaphoresis, appetite change and fatigue.  HEENT: Denies photophobia, eye pain, redness, hearing loss, ear pain, congestion, sore throat, rhinorrhea, sneezing, mouth sores, trouble swallowing, neck pain, neck stiffness and tinnitus.   Respiratory: Denies SOB,  cough, chest tightness,  and wheezing.   Cardiovascular: Denies chest pain, palpitations and leg swelling.  Gastrointestinal: Denies nausea, vomiting, abdominal pain, diarrhea, constipation, blood in stool and abdominal distention.  Genitourinary: Denies dysuria, urgency, frequency, hematuria, flank pain and difficulty urinating.  Endocrine: Denies: hot or cold intolerance, sweats, changes in hair or nails, polyuria, polydipsia. Musculoskeletal: Denies myalgias, back pain, joint swelling, arthralgias and gait problem.  Skin: Denies pallor, rash and wound.  Neurological: Denies dizziness, seizures, syncope, weakness, light-headedness, numbness and  headaches.  Hematological: Denies adenopathy. Easy bruising, personal or family bleeding history  Psychiatric/Behavioral: Denies suicidal ideation, mood changes, confusion, nervousness, sleep disturbance and agitation    Physical Exam: Vitals:   10/22/18 1146  BP: 120/70  Pulse: 70  Temp: 98.2 F (36.8 C)  TempSrc: Oral  SpO2: 97%  Weight: 146 lb 11.2 oz (66.5 kg)    Body mass index is 26.83 kg/m.   Constitutional: NAD,  calm, comfortable Eyes: PERRL, lids and conjunctivae normal ENMT: Mucous membranes are moist.  Respiratory: clear to auscultation bilaterally, no wheezing, no crackles. Normal respiratory effort. No accessory muscle use.  Cardiovascular: Regular rate and rhythm, no murmurs / rubs / gallops. No extremity edema. 2+ pedal pulses. No carotid bruits.  Neurologic: CN 2-12 grossly intact. Sensation intact, DTR normal. Strength 5/5 in all 4.  Psychiatric: Normal judgment and insight. Alert and oriented x 3. Normal mood.    Impression and Plan:  COPD with asthma ([place]) -She has been doing well this past week, has not yet started the medication change that was recommended by Dr. Halford Chessman this past week. -Has follow-up with pulmonary in 3 months.  Screening for osteoporosis - Plan: DG Bone Density Post-menopausal - Plan: DG Bone Density      Estela Isaac Bliss, MD [facility]

## 2018-10-26 ENCOUNTER — Telehealth: Payer: Self-pay | Admitting: Pulmonary Disease

## 2018-10-26 NOTE — Telephone Encounter (Signed)
CXR 10/20/18 >> hyperinflation   Please let her know that her chest xray shows changes of emphysema.  She should transition to anoro once she finishes script for spiriva.

## 2018-10-28 NOTE — Telephone Encounter (Signed)
Attempted to call patient today regarding results. I did not receive an answer at time of call. I have left a voicemail message for pt to return call. X1  

## 2018-11-01 NOTE — Telephone Encounter (Signed)
Attempted to call patient today regarding results. I did not receive an answer at time of call. I have left a voicemail message for pt to return call. X2  

## 2018-11-01 NOTE — Telephone Encounter (Signed)
Patient returned call, CB is [phone] (home).

## 2018-11-01 NOTE — Telephone Encounter (Signed)
Spoke with the pt and notified of results/recs per Dr Halford Chessman  She verbalized understanding  Nothing further needed

## 2018-12-10 ENCOUNTER — Ambulatory Visit (INDEPENDENT_AMBULATORY_CARE_PROVIDER_SITE_OTHER)
Admission: RE | Admit: 2018-12-10 | Discharge: 2018-12-10 | Disposition: A | Discharge status: Home / Self Care | Payer: Medicare HMO | Source: Ambulatory Visit | Attending: Internal Medicine | Admitting: Internal Medicine

## 2018-12-10 DIAGNOSIS — Z13820 Encounter for screening for osteoporosis: Secondary | ICD-10-CM

## 2018-12-10 DIAGNOSIS — Z780 Asymptomatic menopausal state: Secondary | ICD-10-CM | POA: Diagnosis not present

## 2018-12-15 ENCOUNTER — Encounter: Payer: Self-pay | Admitting: Internal Medicine

## 2018-12-15 DIAGNOSIS — M8580 Other specified disorders of bone density and structure, unspecified site: Secondary | ICD-10-CM | POA: Insufficient documentation

## 2019-02-15 ENCOUNTER — Ambulatory Visit: Payer: Medicare HMO | Admitting: Pulmonary Disease

## 2019-02-21 ENCOUNTER — Ambulatory Visit: Payer: Medicare HMO | Admitting: Pulmonary Disease

## 2019-02-28 ENCOUNTER — Ambulatory Visit: Payer: Medicare HMO | Admitting: Family Medicine

## 2019-02-28 ENCOUNTER — Other Ambulatory Visit: Payer: Self-pay

## 2019-03-01 ENCOUNTER — Ambulatory Visit (INDEPENDENT_AMBULATORY_CARE_PROVIDER_SITE_OTHER): Payer: Medicare HMO | Admitting: Internal Medicine

## 2019-03-01 DIAGNOSIS — M545 Low back pain, unspecified: Secondary | ICD-10-CM

## 2019-03-01 NOTE — Progress Notes (Signed)
Virtual Visit via Telephone Note  I connected with Susan Collins on 03/01/19 at  8:30 AM EDT by telephone and verified that I am speaking with the correct person using two identifiers.   I discussed the limitations, risks, security and privacy concerns of performing an evaluation and management service by telephone and the availability of in person appointments. I also discussed with the patient that there may be a patient responsible charge related to this service. The patient expressed understanding and agreed to proceed.  We initially attempted to connect via video chat but were unable to due to technical difficulties on the patient's end, so we converted this visit to a phone visit.   Location patient: home Location provider: work office Participants present for the call: patient, provider Patient did not have a visit in the prior 7 days to address this/these issue(s).   History of Present Illness:  She has scheduled an acute visit to discuss low back pain. It began at the end of last week. She states she "picked something up wrong". She has had this problem in the past and calls them muscle spasms. It is already much better. She has been taking ES tylenol. Was told Epsom salts bath would be good and wonders if it is ok to do. She has not had dysuria, N/V, diarrhea, constipation or any other symptoms.   Observations/Objective: Patient sounds cheerful and well on the phone. I do not appreciate any increased work of breathing. Speech and thought processing are grossly intact. Patient reported vitals: none reported   Current Outpatient Medications:  .  albuterol (VENTOLIN HFA) 108 (90 Base) MCG/ACT inhaler, INHALE TWO PUFFS BY MOUTH EVERY 6 HOURS AS NEEDED, Disp: 18 each, Rfl: 3 .  Cholecalciferol (VITAMIN D3) 1000 units CAPS, Take 1 capsule by mouth daily., Disp: , Rfl:  .  simvastatin (ZOCOR) 40 MG tablet, Take 1 tablet (40 mg total) by mouth daily., Disp: 90 tablet, Rfl: 4 .   triamcinolone (NASACORT ALLERGY 24HR) 55 MCG/ACT AERO nasal inhaler, Place 2 sprays into the nose daily., Disp: 1 Inhaler, Rfl: 12 .  umeclidinium-vilanterol (ANORO ELLIPTA) 62.5-25 MCG/INH AEPB, Inhale 1 puff into the lungs daily., Disp: 1 each, Rfl: 5  Review of Systems:  Constitutional: Denies fever, chills, diaphoresis, appetite change and fatigue.  HEENT: Denies photophobia, eye pain, redness, hearing loss, ear pain, congestion, sore throat, rhinorrhea, sneezing, mouth sores, trouble swallowing, neck pain, neck stiffness and tinnitus.   Respiratory: Denies SOB, DOE, cough, chest tightness,  and wheezing.   Cardiovascular: Denies chest pain, palpitations and leg swelling.  Gastrointestinal: Denies nausea, vomiting, abdominal pain, diarrhea, constipation, blood in stool and abdominal distention.  Genitourinary: Denies dysuria, urgency, frequency, hematuria, flank pain and difficulty urinating.  Endocrine: Denies: hot or cold intolerance, sweats, changes in hair or nails, polyuria, polydipsia. Musculoskeletal: Denies  joint swelling, arthralgias and gait problem.  Skin: Denies pallor, rash and wound.  Neurological: Denies dizziness, seizures, syncope, weakness, light-headedness, numbness and headaches.  Hematological: Denies adenopathy. Easy bruising, personal or family bleeding history  Psychiatric/Behavioral: Denies suicidal ideation, mood changes, confusion, nervousness, sleep disturbance and agitation   Assessment and Plan:  Acute low back pain without sciatica, unspecified back pain laterality -Discussing icing, epsom salt soak, PRN tylenol and ibuprofen. -She will contact us if she continues to have issues. -Return PRN.   I discussed the assessment and treatment plan with the patient. The patient was provided an opportunity to ask questions and all were answered. The patient  agreed with the plan and demonstrated an understanding of the instructions.   The patient was advised to  call back or seek an in-person evaluation if the symptoms worsen or if the condition fails to improve as anticipated.  I provided 13 minutes of non-face-to-face time during this encounter.   Lelon Frohlich, MD [facility] at [facility]

## 2019-03-21 ENCOUNTER — Ambulatory Visit: Payer: Self-pay | Admitting: *Deleted

## 2019-03-21 NOTE — Telephone Encounter (Signed)
I would have her try Miralax and Senokot S-.   If she gets no results from these may need office follow up to rule out impaction.

## 2019-03-21 NOTE — Telephone Encounter (Signed)
Pt has been advised of recommendations. She has also been advised if no improvement in 24 hours or if has increased abd pain, n/v or fever to call office immediately. Pt voiced understanding to all.

## 2019-03-21 NOTE — Telephone Encounter (Signed)
Message from Lionel December sent at 03/21/2019 9:05 AM EDT   Summary: call back about constipation   Patient called stating she is extremely constipated. She hasnt had a bowel movement in 3 days. She is having a lot of stomach pain and is unable to sleep. Would like someone to call to see what she can take for this issue.         Pt called with having constipation over hte last 3 days. She has taken 4 stool softners and an ex lax. And none has helped;. she stated this constipation has being happening more and more over the last several weeks.  She had severe abd pain last night and vomited but none this morning. She has not changed he diet or started on any new medication. The last bm that she had was hard and she had to strain.  She denies having hemorrhoids this time but has a hx of hemorrhoids. She denies bleeding. .  She is requesting medication called in. Advised of doing a virtual appointment. Transferred to the practice for review and recommendation.  Reason for Disposition . Last bowel movement (BM) > 4 days ago  Answer Assessment - Initial Assessment Questions 1. STOOL PATTERN OR FREQUENCY: "How often do you pass bowel movements (BMs)?"  (Normal range: tid to q 3 days)  "When was the last BM passed?"       Every other day 2. STRAINING: "Do you have to strain to have a BM?"      yes 3. RECTAL PAIN: "Does your rectum hurt when the stool comes out?" If so, ask: "Do you have hemorrhoids? How bad is the pain?"  (Scale 1-10; or mild, moderate, severe)     No rectal pain and no hemorrhoids 4. STOOL COMPOSITION: "Are the stools hard?"      Was hard at the last bm 5. BLOOD ON STOOLS: "Has there been any blood on the toilet tissue or on the surface of the BM?" If so, ask: "When was the last time?"      No blood 6. CHRONIC CONSTIPATION: "Is this a new problem for you?"  If no, ask: "How long have you had this problem?" (days, weeks, months)      New problem several weeks ago 7. CHANGES  IN DIET: "Have there been any recent changes in your diet?"      No  8. MEDICATIONS: "Have you been taking any new medications?"     n/a 9. LAXATIVES: "Have you been using any laxatives or enemas?"  If yes, ask "What, how often, and when was the last time?"     ex lax only one dose 10. CAUSE: "What do you think is causing the constipation?"        Not sure 11. OTHER SYMPTOMS: "Do you have any other symptoms?" (e.g., abdominal pain, fever, vomiting)       Abdominal pain, vomited last night,  12. PREGNANCY: "Is there any chance you are pregnant?" "When was your last menstrual period?"       n/a  Protocols used: CONSTIPATION-A-AH

## 2019-03-21 NOTE — Telephone Encounter (Signed)
Dr. Jerilee Hoh is out of the office today.   Dr. Elease Hashimoto - Please advise. Thanks!

## 2019-03-22 ENCOUNTER — Other Ambulatory Visit: Payer: Self-pay

## 2019-03-22 ENCOUNTER — Ambulatory Visit (INDEPENDENT_AMBULATORY_CARE_PROVIDER_SITE_OTHER): Payer: Medicare HMO | Admitting: Internal Medicine

## 2019-03-22 DIAGNOSIS — K5900 Constipation, unspecified: Secondary | ICD-10-CM

## 2019-03-22 NOTE — Progress Notes (Signed)
Virtual Visit via Telephone Note  I connected with Susan Collins on 03/22/19 at  3:00 PM EDT by telephone and verified that I am speaking with the correct person using two identifiers.   I discussed the limitations, risks, security and privacy concerns of performing an evaluation and management service by telephone and the availability of in person appointments. I also discussed with the patient that there may be a patient responsible charge related to this service. The patient expressed understanding and agreed to proceed.  We initially attempted to connect via video chat but were unable to due to technical difficulties on the patient's end, so we converted this visit to a phone visit.   Location patient: home Location provider: work office Participants present for the call: patient, provider Patient did not have a visit in the prior 7 days to address this/these issue(s).   History of Present Illness:  She has scheduled this visit to discuss constipation. She usually has a BM every third day. Has been having mild abdominal pain and cramping. Yesterday, a friend bought her senna and miralax which she took and had some passage of a very hard stool. Today she has had 2 runny BMs, small amount. She wonders what she can do.   Observations/Objective: Patient sounds cheerful and well on the phone. I do not appreciate any increased work of breathing. Speech and thought processing are grossly intact. Patient reported vitals: none reported   Current Outpatient Medications:  .  albuterol (VENTOLIN HFA) 108 (90 Base) MCG/ACT inhaler, INHALE TWO PUFFS BY MOUTH EVERY 6 HOURS AS NEEDED, Disp: 18 each, Rfl: 3 .  Cholecalciferol (VITAMIN D3) 1000 units CAPS, Take 1 capsule by mouth daily., Disp: , Rfl:  .  simvastatin (ZOCOR) 40 MG tablet, Take 1 tablet (40 mg total) by mouth daily., Disp: 90 tablet, Rfl: 4 .  triamcinolone (NASACORT ALLERGY 24HR) 55 MCG/ACT AERO nasal inhaler, Place 2 sprays  into the nose daily., Disp: 1 Inhaler, Rfl: 12 .  umeclidinium-vilanterol (ANORO ELLIPTA) 62.5-25 MCG/INH AEPB, Inhale 1 puff into the lungs daily., Disp: 1 each, Rfl: 5  Review of Systems:  Constitutional: Denies fever, chills, diaphoresis, appetite change and fatigue.  HEENT: Denies photophobia, eye pain, redness, hearing loss, ear pain, congestion, sore throat, rhinorrhea, sneezing, mouth sores, trouble swallowing, neck pain, neck stiffness and tinnitus.   Respiratory: Denies SOB, DOE, cough, chest tightness,  and wheezing.   Cardiovascular: Denies chest pain, palpitations and leg swelling.  Gastrointestinal: Denies nausea, vomiting, blood in stool and significant abdominal distention.  Genitourinary: Denies dysuria, urgency, frequency, hematuria, flank pain and difficulty urinating.  Endocrine: Denies: hot or cold intolerance, sweats, changes in hair or nails, polyuria, polydipsia. Musculoskeletal: Denies myalgias, back pain, joint swelling, arthralgias and gait problem.  Skin: Denies pallor, rash and wound.  Neurological: Denies dizziness, seizures, syncope, weakness, light-headedness, numbness and headaches.  Hematological: Denies adenopathy. Easy bruising, personal or family bleeding history  Psychiatric/Behavioral: Denies suicidal ideation, mood changes, confusion, nervousness, sleep disturbance and agitation   Assessment and Plan:  Constipation, unspecified constipation type -Have advised continued use of senna and miralax over the next week to aim for a semi-solid BM at least every day. Runny BMs today likely represent overflowing of liquid stool. -She knows to RTC if continued to have issues.   I discussed the assessment and treatment plan with the patient. The patient was provided an opportunity to ask questions and all were answered. The patient agreed with the plan and demonstrated an  understanding of the instructions.   The patient was advised to call back or seek an  in-person evaluation if the symptoms worsen or if the condition fails to improve as anticipated.  I provided 13 minutes of non-face-to-face time during this encounter.   Lelon Frohlich, MD [facility]

## 2019-03-28 ENCOUNTER — Ambulatory Visit: Payer: Self-pay | Admitting: Internal Medicine

## 2019-03-28 ENCOUNTER — Telehealth: Payer: Self-pay | Admitting: Pulmonary Disease

## 2019-03-28 NOTE — Telephone Encounter (Signed)
Continue Anoro and prn rescue inhaler 2 puffs every 4-6 hours for sob. Advise she attend her apt tomorrow with PCP so that they can assess her further and listen to her lung sounds. May need to qualify for oxygen or need treatment for COPD exacerbation.

## 2019-03-28 NOTE — Telephone Encounter (Signed)
  Pt called in c/o being short of breath.   I've had asthma all my life and it's gone into COPD and I'm having a flare up.  I only have 2 nebulizer treatments left and it's time for me to use one. She was speaking in sentences but had to take a breath between every sentence and I could hear the shortness of breath.  I warm transferred her to Dr. Ledell Noss office to be scheduled per protocol to be seen within 4 hrs.  I instructed her to go to the ED if she got more short of breath.   She verbalized understanding.  I sent my notes to the office. Reason for Disposition . [1] Longstanding difficulty breathing AND [2] not responding to usual therapy  Answer Assessment - Initial Assessment Questions 1. RESPIRATORY STATUS: "Describe your breathing?" (e.g., wheezing, shortness of breath, unable to speak, severe coughing)      I have COPD.  I'm very short of breath.   I was short of breath last week. 2. ONSET: "When did this breathing problem begin?"      Last week. 3. PATTERN "Does the difficult breathing come and go, or has it been constant since it started?"      It's constant.    I have a nebulizer. 4. SEVERITY: "How bad is your breathing?" (e.g., mild, moderate, severe)    - MILD: No SOB at rest, mild SOB with walking, speaks normally in sentences, can lay down, no retractions, pulse < 100.    - MODERATE: SOB at rest, SOB with minimal exertion and prefers to sit, cannot lie down flat, speaks in phrases, mild retractions, audible wheezing, pulse 100-120.    - SEVERE: Very SOB at rest, speaks in single words, struggling to breathe, sitting hunched forward, retractions, pulse > 120      She is very short of breath and speaking in sentences. 5. RECURRENT SYMPTOM: "Have you had difficulty breathing before?" If so, ask: "When was the last time?" and "What happened that time?"      Yes   COPD 6. CARDIAC HISTORY: "Do you have any history of heart disease?" (e.g., heart attack, angina, bypass surgery,  angioplasty)      No 7. LUNG HISTORY: "Do you have any history of lung disease?"  (e.g., pulmonary embolus, asthma, emphysema)     COPD 8. CAUSE: "What do you think is causing the breathing problem?"      My COPD and asthma. 9. OTHER SYMPTOMS: "Do you have any other symptoms? (e.g., dizziness, runny nose, cough, chest pain, fever)     No other symptoms 10. PREGNANCY: "Is there any chance you are pregnant?" "When was your last menstrual period?"       N/A 11. TRAVEL: "Have you traveled out of the country in the last month?" (e.g., travel history, exposures)       No travels or exposures to COVID-19  Protocols used: BREATHING DIFFICULTY-A-AH

## 2019-03-28 NOTE — Telephone Encounter (Signed)
Primary Pulmonologist: [facility] Last office visit and with whom: 10/20/2018 with [facility] What do we see them for (pulmonary problems): centrilobular emphysema Last OV assessment/plan:  Assessment/Plan:   COPD with emphysema. - she has progressive dyspnea which seems like progression of her COPD - will try changing her to anoro in place of spiriva once she finishes this script - will get chest xray today - continue prn ventolin   Patient Instructions  Chest xray today  Change to anoro one puff daily after you finish current script for spiriva  Follow up in 4 months  Was appointment offered to patient (explain)?     Reason for call: called and spoke with pt who states she has been having difficulty breathing x1 week.   Asked pt if she was able to check her O2 sats and she does not have a pulse ox that she can use to check O2 sats.  Pt states that she is using rescue inhaler once daily and also is using her anoro.  Pt has an occ cough which she states is a dry cough. Pt states that she has had some diarrhea due to previously being constipated and she took some medicine to help with the constipation and after the medicine, she did have some diarrhea but that is now cleared up.  Pt denies any complaints of fever, body aches, or chills.   Pt stated that she is going to her PCP tomorrow for an appt. Due to pt's breathing, she believes that she might need to be placed on O2. Beth, please advise on all this for pt. Thanks!

## 2019-03-28 NOTE — Telephone Encounter (Signed)
Called and spoke with pt letting her know the information stated by [facility]. Stated to her to continue taking Anoro daily and that she could use rescue inhaler every 4-6 hours prn SOB.  Stated to pt at her OV tomorrow with Dr. Deniece Ree that they may want to do a qualifying walk to see if she would qualify for O2 or if she did not, they may need to treat her for a COPD exacerbation. I stated to pt that I would send this information to Dr. Deniece Ree as an [place] since pt does have the appt with her tomorrow and pt verbalized understanding.  Dr. Isaac Bliss, please see all info in this encounter as an  FYI in regards to pt's appt she has scheduled with you tomorrow. Thank you!

## 2019-03-29 ENCOUNTER — Ambulatory Visit (INDEPENDENT_AMBULATORY_CARE_PROVIDER_SITE_OTHER): Payer: Medicare HMO

## 2019-03-29 ENCOUNTER — Ambulatory Visit (HOSPITAL_COMMUNITY)
Admission: EM | Admit: 2019-03-29 | Discharge: 2019-03-29 | Disposition: A | Discharge status: Home / Self Care | Payer: Medicare HMO | Attending: Emergency Medicine | Admitting: Emergency Medicine

## 2019-03-29 ENCOUNTER — Encounter (HOSPITAL_COMMUNITY): Payer: Self-pay

## 2019-03-29 ENCOUNTER — Ambulatory Visit: Payer: Medicare HMO | Admitting: Internal Medicine

## 2019-03-29 ENCOUNTER — Other Ambulatory Visit: Payer: Self-pay

## 2019-03-29 DIAGNOSIS — R0602 Shortness of breath: Secondary | ICD-10-CM | POA: Diagnosis not present

## 2019-03-29 DIAGNOSIS — J441 Chronic obstructive pulmonary disease with (acute) exacerbation: Secondary | ICD-10-CM | POA: Diagnosis not present

## 2019-03-29 DIAGNOSIS — J449 Chronic obstructive pulmonary disease, unspecified: Secondary | ICD-10-CM | POA: Diagnosis not present

## 2019-03-29 MED ORDER — ALBUTEROL SULFATE (2.5 MG/3ML) 0.083% IN NEBU: 2.5000 mg | INHALATION_SOLUTION | Freq: Four times a day (QID) | RESPIRATORY_TRACT | 12 refills | Status: DC | PRN

## 2019-03-29 MED ORDER — AZITHROMYCIN 250 MG PO TABS: 250.0000 mg | ORAL_TABLET | Freq: Every day | ORAL | 0 refills | Status: DC

## 2019-03-29 NOTE — Telephone Encounter (Signed)
Unable to see patient in office due to positive COVID-19 pre-screen questionnaire due to ongoing symptoms. She has been directed to UC today.

## 2019-03-29 NOTE — ED Triage Notes (Signed)
Patient presents to Urgent Care with complaints of shortness of breath since the past few days. Patient reports she has COPD and has multiple breathing treatments at home, only has one nebulizer solution left. Pt states her breathing is always difficult but it is harder to take deep breaths and the rainy weather has made it worse.

## 2019-03-29 NOTE — ED Provider Notes (Signed)
[facility]    CSN: [id number] Arrival date & time: 03/29/19  1032     History   Chief Complaint Chief Complaint  Patient presents with  . Shortness of Breath    HPI Susan Collins is a 73 y.o. female with history of COPD, asthma presenting for increased difficulty breathing.  Patient states this is been ongoing for the last week.  Patient states "I feel like I need to take a deeper breath.  Patient denies audible wheezing, cough, hemoptysis, lower extremity edema, claudication.  Patient reports compliancy with her daily Anoro Ellipta inhaler.  Patient using albuterol MDI as needed.  Patient states she is having to use this now twice daily, which is increased from baseline.  Patient also has an albuterol nebulizer at home, though patient states she only uses this before bed.  Patient denies fever, chills, malaise, myalgias, decreased appetite, nausea, vomiting, chest pain.  No known sick contacts, or contact with COVID positive persons.  Patient followed by pulmonologist, that she tried to schedule appoint with him but was unable to due to [place].  Telephone note was reviewed at time of appointment: Possible COPD exacerbation, would like chest x-ray.   Past Medical History:  Diagnosis Date  . CHEST PAIN 11/20/2008  . COLONIC POLYPS, HX OF 05/24/2007  . Diverticular disease   . DYSPNEA ON EXERTION 12/19/2008  . HYPERCHOLESTEROLEMIA 09/18/2008  . 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 Peripheral neuropathy   . POSTMENOPAUSAL SYNDROME 04/12/2008  . 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 Osteopenia 12/15/2018  . Dyslipidemia 04/26/2015  . COPD with asthma ([place]) 03/23/2015  . Asthma 08/22/2011  . Dyspnea and respiratory abnormality 12/19/2008  . 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 CHEST PAIN 11/20/2008  . HYPERCHOLESTEROLEMIA 09/18/2008  . POSTMENOPAUSAL SYNDROME 04/12/2008  . Idiopathic peripheral autonomic  neuropathy 11/17/2007  . COLONIC POLYPS, HX OF 05/24/2007    Past Surgical History:  Procedure Laterality Date  . ABDOMINAL HYSTERECTOMY    . LUMBAR LAMINECTOMY    . NASAL SINUS SURGERY    . SHOULDER SURGERY      OB History   No obstetric history on file.      Home Medications    Prior to Admission medications   Medication Sig Start Date End Date Taking? Authorizing Provider  albuterol (PROVENTIL) (2.5 MG/3ML) 0.083% nebulizer solution Take 3 mLs (2.5 mg total) by nebulization every 6 (six) hours as needed for wheezing or shortness of breath. 03/29/19   Hall-Potvin, [place], PA-C  albuterol (VENTOLIN HFA) 108 (90 Base) MCG/ACT inhaler INHALE TWO PUFFS BY MOUTH EVERY 6 HOURS AS NEEDED 09/13/18   Panosh, Standley Brooking, MD  azithromycin (ZITHROMAX) 250 MG tablet Take 1 tablet (250 mg total) by mouth daily. Take first 2 tablets together, then 1 every day until finished. 03/29/19   Hall-Potvin, [place], PA-C  Cholecalciferol (VITAMIN D3) 1000 units CAPS Take 1 capsule by mouth daily.    [provider]  simvastatin (ZOCOR) 40 MG tablet Take 1 tablet (40 mg total) by mouth daily. 05/20/18   Marletta Lor, MD  triamcinolone (NASACORT ALLERGY 24HR) 55 MCG/ACT AERO nasal inhaler Place 2 sprays into the nose daily. 05/20/18   Marletta Lor, MD  umeclidinium-vilanterol (ANORO ELLIPTA) 62.5-25 MCG/INH AEPB Inhale 1 puff into the lungs daily. 10/20/18   Chesley Mires, MD    Family History Family History  Problem Relation Age of Onset  . Cancer Mother  died of lung Ca  . COPD Father   . Heart disease Father        died of MI  . Cancer Brother        bladder Ca  . COPD Brother     Social History Social History   Tobacco Use  . Smoking status: Former Smoker    Packs/day: 1.00    Years: 53.00    Pack years: 53.00    Types: Cigarettes    Quit date: 10/13/1996    Years since quitting: 22.4  . Smokeless tobacco: Never Used  . Tobacco comment: smoked 1-1.5 PPD    Substance Use Topics  . Alcohol use: No    Alcohol/week: 0.0 standard drinks  . Drug use: No     Allergies   Sulfonamide derivatives   Review of Systems As per HPI   Physical Exam Triage Vital Signs ED Triage Vitals  Enc Vitals Group     BP 03/29/19 1103 (!) 133/48     Pulse Rate 03/29/19 1103 77     Resp 03/29/19 1103 (!) 22     Temp 03/29/19 1103 99.1 F (37.3 C)     Temp Source 03/29/19 1103 Oral     SpO2 03/29/19 1103 98 %     Weight --      Height --      Head Circumference --      Peak Flow --      Pain Score 03/29/19 1059 3     Pain Loc --      Pain Edu? --      Excl. in [place]? --    No data found.  Updated Vital Signs BP (!) 133/48 (BP Location: Left Arm)   Pulse 77   Temp 99.1 F (37.3 C) (Oral)   Resp (!) 22   SpO2 98%   Visual Acuity Right Eye Distance:   Left Eye Distance:   Bilateral Distance:    Right Eye Near:   Left Eye Near:    Bilateral Near:     Physical Exam Constitutional:      General: She is not in acute distress.    Appearance: She is normal weight.  HENT:     Head: Normocephalic and atraumatic.     Mouth/Throat:     Mouth: Mucous membranes are moist.     Pharynx: Oropharynx is clear. No pharyngeal swelling or oropharyngeal exudate.  Eyes:     General: No scleral icterus.    Pupils: Pupils are equal, round, and reactive to light.  Neck:     Musculoskeletal: Neck supple.     Trachea: No tracheal deviation.  Cardiovascular:     Rate and Rhythm: Normal rate.  Pulmonary:     Effort: Pulmonary effort is normal. No tachypnea, accessory muscle usage or respiratory distress.     Breath sounds: No stridor. Examination of the right-lower field reveals rales. Examination of the left-lower field reveals rales. Decreased breath sounds and rales present. No wheezing or rhonchi.     Comments: Respiratory rate per examiner 18 Chest:     Chest wall: No deformity, tenderness or crepitus.  Lymphadenopathy:     Cervical: No cervical  adenopathy.  Skin:    General: Skin is warm.     Capillary Refill: Capillary refill takes less than 2 seconds.     Coloration: Skin is not cyanotic, jaundiced or pale.     Findings: No rash.  Neurological:     General: No focal deficit present.  Mental Status: She is alert and oriented to person, place, and time.      UC Treatments / Results  Labs (all labs ordered are listed, but only abnormal results are displayed) Labs Reviewed - No data to display  EKG None  Radiology Dg Chest 2 View  Result Date: 03/29/2019 CLINICAL DATA:  Progressive shortness of breath. COPD. Bilateral rales. EXAM: CHEST - 2 VIEW COMPARISON:  10/20/2018 FINDINGS: The heart size and pulmonary vascularity are normal. Aortic atherosclerosis. Interstitial markings are diffusely accentuated on a chronic basis. No consolidative infiltrates or effusions. The lungs are hyperinflated with flattening of the diaphragm consistent with the history of COPD. No significant bone abnormality. IMPRESSION: No acute abnormality.  COPD. Aortic Atherosclerosis (ICD10-I70.0). Electronically Signed   By: Lorriane Shire M.D.   On: 03/29/2019 12:23    Procedures Procedures (including critical care time)  Medications Ordered in UC Medications - No data to display  Initial Impression / Assessment and Plan / UC Course  I have reviewed the triage vital signs and the nursing notes.  Pertinent labs & imaging results that were available during my care of the patient were reviewed by me and considered in my medical decision making (see chart for details).     73 year old female history of COPD and asthma on daily Ellipta inhaler for COPD maintenance presenting for increased shortness of breath.  Physical exam significant for bilateral lower lobe.  Chest x-ray reviewed by radiologist: No acute abnormality, consistent with COPD.  Incidental finding of aortic atherosclerosis that is been noticed on previous imaging.  Will refill  patient's nebulizer as she has run out of this, and start Z-Pak due to risk of morbidity if underlying pneumonia brewing.  Patient instructed to call her pulmonologist later today, patient states that she has appointment pending next week, plans on keeping this. Final Clinical Impressions(s) / UC Diagnoses   Final diagnoses:  COPD exacerbation [facility])     Discharge Instructions     Take antibiotic as directed. Use nebulizer as needed. Follow up w/ pulmonologist regarding COPD management/possible O2 therapy.    ED Prescriptions    Medication Sig Dispense Auth. Provider   albuterol (PROVENTIL) (2.5 MG/3ML) 0.083% nebulizer solution Take 3 mLs (2.5 mg total) by nebulization every 6 (six) hours as needed for wheezing or shortness of breath. 75 mL Hall-Potvin, [place], PA-C   azithromycin (ZITHROMAX) 250 MG tablet Take 1 tablet (250 mg total) by mouth daily. Take first 2 tablets together, then 1 every day until finished. 6 tablet Hall-Potvin, [place], PA-C     Controlled Substance Prescriptions [place] Controlled Substance Registry consulted? Not Applicable   Quincy Sheehan, [place] 03/29/19 1343

## 2019-03-29 NOTE — ED Notes (Signed)
Patient states she went to her 10:00 AM appointment and they sent her to Urgent Care for shortness of breath.

## 2019-03-29 NOTE — Discharge Instructions (Signed)
Take antibiotic as directed. Use nebulizer as needed. Follow up w/ pulmonologist regarding COPD management/possible O2 therapy.

## 2019-03-29 NOTE — Telephone Encounter (Signed)
Noted. Nothing further needed. 

## 2019-03-30 ENCOUNTER — Encounter: Payer: Self-pay | Admitting: Nurse Practitioner

## 2019-03-30 ENCOUNTER — Telehealth: Payer: Self-pay | Admitting: *Deleted

## 2019-03-30 ENCOUNTER — Ambulatory Visit (INDEPENDENT_AMBULATORY_CARE_PROVIDER_SITE_OTHER): Payer: Medicare HMO | Admitting: Nurse Practitioner

## 2019-03-30 ENCOUNTER — Encounter: Payer: Self-pay | Admitting: General Surgery

## 2019-03-30 VITALS — BP 102/48 | HR 80 | Temp 98.0°F | Ht 63.0 in | Wt 143.6 lb

## 2019-03-30 DIAGNOSIS — J441 Chronic obstructive pulmonary disease with (acute) exacerbation: Secondary | ICD-10-CM

## 2019-03-30 DIAGNOSIS — Z20822 Contact with and (suspected) exposure to covid-19: Secondary | ICD-10-CM

## 2019-03-30 NOTE — Telephone Encounter (Signed)
Pt called and message left to return call to be scheduled for testing.

## 2019-03-30 NOTE — Progress Notes (Signed)
Left message for pt to return call to be scheduled for testing.

## 2019-03-30 NOTE — Progress Notes (Signed)
@Patient  ID: Susan Collins, female    DOB: 28-Sep-1946, 73 y.o.   MRN: [id number]  Chief Complaint  Patient presents with  . Shortness of Breath    Seen by urgent care and diagnosed with COPD Exacerbation. Patient not tested for COVID.    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 Halford Chessman.  Tests:  CXR 03/29/19 - No acute abnormality.  COPD.  PFT 03/21/15 >> FEV1 1.43 (66%), FEV1% 66, TLC 5.56 (113%), DLCO 52%, + BD  OV 03/30/19 - hospital follow up  Patient presents today for hospital follow-up.  She was seen in the ED yesterday for COPD exacerbation.  She was directed there by her PCP after failing COVID screening questions.  Patient was not tested for COVID during hospital visit.  Patient was ordered nebulizer treatment and prescribed azithromycin at discharge.  Patient's chest x-ray at hospital visit showed pneumonia.  Patient denies any recent fever but has had shortness of breath and diarrhea over the last couple weeks.  Patient does state that she feels much better today. Denies f/c/s, n/v/d, hemoptysis, PND, leg swelling.    Allergies  Allergen Reactions  . Sulfonamide Derivatives     REACTION: headache    Immunization History  Administered Date(s) Administered  . Influenza, High Dose Seasonal PF 10/22/2018  . Influenza,inj,Quad PF,6+ Mos 07/20/2013  . Pneumococcal Conjugate-13 08/08/2014  . Pneumococcal Polysaccharide-23 07/20/2013  . Td 04/19/2009  . Zoster 08/08/2013    Past Medical History:  Diagnosis Date  . CHEST PAIN 11/20/2008  . COLONIC POLYPS, HX OF 05/24/2007  . Diverticular disease   . DYSPNEA ON EXERTION 12/19/2008  . HYPERCHOLESTEROLEMIA 09/18/2008  . 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 Peripheral neuropathy   . POSTMENOPAUSAL SYNDROME 04/12/2008  . 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 Packs/day: 1.00  . Years: 53.00  . Pack years: 53.00  . Types: Cigarettes  . Quit date: 10/13/1996  . Years since quitting: 22.4  Smokeless Tobacco Never Used  Tobacco Comment   smoked 1-1.5 PPD    Counseling given: Yes Comment: smoked 1-1.5 PPD    Outpatient Encounter Medications as of 03/30/2019  Medication Sig  . albuterol (PROVENTIL) (2.5 MG/3ML) 0.083% nebulizer solution Take 3 mLs (2.5 mg total) by nebulization every 6 (six) hours as needed for wheezing or shortness of breath.  Marland Kitchen albuterol (VENTOLIN HFA) 108 (90 Base) MCG/ACT inhaler INHALE TWO PUFFS BY MOUTH EVERY 6 HOURS AS NEEDED  . azithromycin (ZITHROMAX) 250 MG tablet Take 1 tablet (250 mg total) by mouth daily. Take first 2 tablets together, then 1 every day until finished.  . Cholecalciferol (VITAMIN D3) 1000 units CAPS Take 1 capsule by mouth daily.  . simvastatin (ZOCOR) 40 MG tablet Take 1 tablet (40 mg total) by mouth daily.  Marland Kitchen triamcinolone (NASACORT ALLERGY 24HR) 55 MCG/ACT AERO nasal inhaler Place 2 sprays into the nose daily.  Marland Kitchen umeclidinium-vilanterol (ANORO ELLIPTA) 62.5-25 MCG/INH AEPB Inhale 1 puff into the lungs daily.   No facility-administered encounter medications on file as of 03/30/2019.      Review of Systems  Review of Systems  Constitutional: Negative.  Negative for chills and fever.  HENT: Negative.   Respiratory: Positive for shortness of breath. Negative for cough and wheezing.   Cardiovascular: Negative.  Negative for chest pain, palpitations and leg swelling.  Gastrointestinal: Negative.  Allergic/Immunologic: Negative.   Neurological: Negative.   Psychiatric/Behavioral: Negative.        Physical Exam  BP (!) 102/48 (BP Location: Right Arm, Patient Position: Sitting, Cuff Size: Normal)   Pulse 80   Temp 98 F (36.7 C)   Ht 5\' 3"  (1.6 m)   Wt 143 lb 9.6 oz (65.1 kg)   SpO2 96%   BMI 25.44 kg/m   Wt Readings from Last 5 Encounters:  03/30/19 143 lb 9.6 oz (65.1 kg)   10/22/18 146 lb 11.2 oz (66.5 kg)  10/20/18 147 lb 9.6 oz (67 kg)  09/22/18 144 lb 1.6 oz (65.4 kg)  09/13/18 144 lb 9.6 oz (65.6 kg)     Physical Exam Vitals signs and nursing note reviewed.  Constitutional:      General: She is not in acute distress.    Appearance: She is well-developed.  Cardiovascular:     Rate and Rhythm: Normal rate and regular rhythm.  Pulmonary:     Effort: Pulmonary effort is normal.     Breath sounds: Normal breath sounds. No wheezing or rhonchi.  Musculoskeletal:     Right lower leg: No edema.  Neurological:     Mental Status: She is alert and oriented to person, place, and time.      Imaging: Dg Chest 2 View  Result Date: 03/29/2019 CLINICAL DATA:  Progressive shortness of breath. COPD. Bilateral rales. EXAM: CHEST - 2 VIEW COMPARISON:  10/20/2018 FINDINGS: The heart size and pulmonary vascularity are normal. Aortic atherosclerosis. Interstitial markings are diffusely accentuated on a chronic basis. No consolidative infiltrates or effusions. The lungs are hyperinflated with flattening of the diaphragm consistent with the history of COPD. No significant bone abnormality. IMPRESSION: No acute abnormality.  COPD. Aortic Atherosclerosis (ICD10-I70.0). Electronically Signed   By: Lorriane Shire M.D.   On: 03/29/2019 12:23     Assessment & Plan:   COPD with acute exacerbation [facility]) Patient was seen in the ED yesterday for COPD exacerbation.  She was directed there by her PCP after failing COVID screening questions.  Patient was not tested for COVID during hospital visit.  Patient was ordered nebulizer treatment and prescribed azithromycin at discharge.  Patient's chest x-ray at hospital visit showed pneumonia.  Patient denies any recent fever but has had shortness of breath and diarrhea over the last couple weeks.  Patient does state that she feels much better today.  Plan: Patient Instructions  Recent covid screening questions positive and patient was  sent to ED for covid screening. Patient was not tested - will order covid testing.  Continue Anoro 1 puff daily Continue albuterol inhaler or nebulizer as needed Continue azithromycin until completed  Follow up: Follow up in 2 weeks after covid testing and antibiotics completed with Lazaro Arms, FNP-C       Fenton Foy, NP 03/30/2019

## 2019-03-30 NOTE — Patient Instructions (Addendum)
Recent covid screening questions positive and patient was sent to ED for covid screening. Patient was not tested - will order covid testing.  Continue Anoro 1 puff daily Continue albuterol inhaler or nebulizer as needed Continue azithromycin until completed  Follow up: Follow up in 2 weeks after covid testing and antibiotics completed with Lazaro Arms, FNP-C

## 2019-03-30 NOTE — Assessment & Plan Note (Signed)
Patient was seen in the ED yesterday for COPD exacerbation.  She was directed there by her PCP after failing COVID screening questions.  Patient was not tested for COVID during hospital visit.  Patient was ordered nebulizer treatment and prescribed azithromycin at discharge.  Patient's chest x-ray at hospital visit showed pneumonia.  Patient denies any recent fever but has had shortness of breath and diarrhea over the last couple weeks.  Patient does state that she feels much better today.  Plan: Patient Instructions  Recent covid screening questions positive and patient was sent to ED for covid screening. Patient was not tested - will order covid testing.  Continue Anoro 1 puff daily Continue albuterol inhaler or nebulizer as needed Continue azithromycin until completed  Follow up: Follow up in 2 weeks after covid testing and antibiotics completed with Lazaro Arms, FNP-C

## 2019-03-30 NOTE — Telephone Encounter (Signed)
See telephone encounter for referral for testing.  Pt called and spoke with husband. Pt right there was scheduled for the covid-19 test for today the [facility], education building for tomorrow at 10 am. She is advised to wear a mask, stay in the car with windows rolled up until time for testing. This is a drive thru test site. They voiced understanding.

## 2019-03-30 NOTE — Telephone Encounter (Signed)
COVID Screen needed (Pt was to see Family Medicine 03/29/19, covid screen was positive and she was directed to go to urgent care. Urgent treated patient for COPD exacerbation, but no covid testing was performed. Patient advised she will be contacted by [facility] and once a negative result has been rec'd we can schedule follow up in office visit and qualifying walk if needed.)  [name], Edwinna Areola, CMA  [facility] Testing Pool 54 minutes ago (11:06 AM)   Patient was directed to urgent care by [facility] yesterday based on screen questions. The patient was not tested at urgent care and directed to come to our office for a qualifying walk per the patient. Thank you in advance for your assistance, it is appreciated.   Routing comment

## 2019-03-31 ENCOUNTER — Telehealth: Payer: Self-pay | Admitting: *Deleted

## 2019-03-31 ENCOUNTER — Other Ambulatory Visit: Payer: Medicare HMO

## 2019-03-31 DIAGNOSIS — R6889 Other general symptoms and signs: Secondary | ICD-10-CM | POA: Diagnosis not present

## 2019-03-31 DIAGNOSIS — Z20822 Contact with and (suspected) exposure to covid-19: Secondary | ICD-10-CM

## 2019-03-31 NOTE — Telephone Encounter (Signed)
Patient calling requesting to speak with Dr. Jerilee Hoh about recommendation. She is requesting a call back.

## 2019-03-31 NOTE — Telephone Encounter (Signed)
Patient called to schedule an appointment for SOB.  Patient was advised to go to the ER per Dr Jerilee Hoh.

## 2019-04-01 ENCOUNTER — Telehealth: Payer: Self-pay | Admitting: Internal Medicine

## 2019-04-01 NOTE — Telephone Encounter (Signed)
FYI

## 2019-04-01 NOTE — Telephone Encounter (Signed)
Left message on machine for patient to see how she is feeling today. CRM

## 2019-04-01 NOTE — Telephone Encounter (Unsigned)
Copied from [place] [phone]. Topic: Quick Communication - See Telephone Encounter >> Apr 01, 2019 12:16 PM Blase Mess A wrote: CRM for notification. See Telephone encounter for: 04/01/19.  Patient  is calling to thank Dr. Jerilee Hoh for calling and checking on her. She really appreciated her calling her. Thank you

## 2019-04-01 NOTE — Telephone Encounter (Signed)
Patient returning call to office. NT currently unavailable. States that she is feeling 95% better than she was yesterday.

## 2019-04-01 NOTE — Telephone Encounter (Signed)
Copied from [place] [phone]. Topic: Quick Communication - See Telephone Encounter >> Apr 01, 2019 12:16 PM Blase Mess A wrote: CRM for notification. See Telephone encounter for: 04/01/19.  Patient is calling to thank Dr. Jerilee Hoh for calling and checking in on her. Thank you

## 2019-04-04 LAB — NOVEL CORONAVIRUS, NAA: SARS-CoV-2, NAA: NOT DETECTED

## 2019-04-05 ENCOUNTER — Telehealth: Payer: Self-pay | Admitting: Internal Medicine

## 2019-04-05 ENCOUNTER — Telehealth: Payer: Self-pay | Admitting: *Deleted

## 2019-04-05 NOTE — Telephone Encounter (Signed)
Pt is aware covid 19 test is negative °

## 2019-04-05 NOTE — Telephone Encounter (Signed)
Copied from [place] [phone]. Topic: General - Other >> Apr 04, 2019 12:09 PM Carolyn Stare wrote:  Pt req an appt 04/12/2019 for a follow up with Dr Jerilee Hoh

## 2019-04-12 ENCOUNTER — Other Ambulatory Visit: Payer: Self-pay

## 2019-04-12 ENCOUNTER — Ambulatory Visit: Payer: Medicare HMO | Admitting: Internal Medicine

## 2019-04-12 DIAGNOSIS — J441 Chronic obstructive pulmonary disease with (acute) exacerbation: Secondary | ICD-10-CM

## 2019-04-12 NOTE — Progress Notes (Signed)
Patient was scheduled for a phone visit. Called all numbers on file x 3 without response; no availability to leave VM.  Domingo Mend, MD

## 2019-04-14 ENCOUNTER — Telehealth: Payer: Self-pay | Admitting: Pulmonary Disease

## 2019-04-14 ENCOUNTER — Telehealth: Payer: Self-pay

## 2019-04-14 MED ORDER — PREDNISONE 10 MG PO TABS: ORAL_TABLET | ORAL | 0 refills | Status: DC

## 2019-04-14 NOTE — Telephone Encounter (Signed)
I sent her in some prednisone. Please make a follow up appointment in 2 weeks. Thanks.

## 2019-04-14 NOTE — Telephone Encounter (Signed)
Copied from [place] [phone]. Topic: General - Other >> Apr 14, 2019  1:52 PM Leward Quan A wrote: Reason for CRM: Patient called to say that she is still having some trouble with her breathing asking can Dr Jerilee Hoh send something to the pharmacy to aide in her breathing. Ph# [phone]

## 2019-04-14 NOTE — Telephone Encounter (Signed)
Patient advised to call her pulmonologist.  Patient verbalized that she will call Dr Llana Aliment.

## 2019-04-14 NOTE — Telephone Encounter (Signed)
Spoke with patient. She is aware of [place]'s recommendations. She stated that she will have to call back to make the appointment.   Nothing further needed at time of call.

## 2019-04-14 NOTE — Telephone Encounter (Signed)
Can we call her and find out some more information? Because I cannot see her in office, it makes it quite difficult to prescribe a medication for SOB over the phone; many things can cause SOB. Maybe she can try her pulmonologist's office?

## 2019-04-14 NOTE — Telephone Encounter (Signed)
Primary Pulmonologist: [facility] Last office visit and with whom: 03/30/2019 with TN What do we see them for (pulmonary problems): COPD Last OV assessment/plan: Recent covid screening questions positive and patient was sent to ED for covid screening. Patient was not tested - will order covid testing.  Continue Anoro 1 puff daily Continue albuterol inhaler or nebulizer as needed Continue azithromycin until completed  Follow up: Follow up in 2 weeks after covid testing and antibiotics completed with Lazaro Arms, FNP-C  Was appointment offered to patient (explain)?  Patient wanted recommendations first   Reason for call: Spoke with patient. She stated that she is still having issues with SOB. At first, she was just having the SOB episodes with exertion. Now, she is SOB throughout the day regardless of exertion. Denies any fever, cough or body aches. She stated that she was tested for COVID a few weeks ago and her results came back negative. She has been using her Anoro, albuterol and Nasacort.   She wants to know if she could have something called in for her.   Pharmacy is [facility] on [address].   [place], please advise. Thanks!

## 2019-04-19 ENCOUNTER — Telehealth: Payer: Self-pay

## 2019-04-19 ENCOUNTER — Ambulatory Visit: Payer: Self-pay

## 2019-04-19 NOTE — Telephone Encounter (Signed)
Copied from [place] [phone]. Topic: General - Other >> Apr 19, 2019  1:52 PM Leward Quan A wrote: Reason for CRM: Patient called to speak to Dr Jerilee Hoh states that she have completed the predniSONE (DELTASONE) 10 MG tablet given to her by the Pulmonologist and still using her nebulizer quite a bit but still having issues with her breathing. Patient request a call back at Ph# [phone]

## 2019-04-19 NOTE — Telephone Encounter (Signed)
Incoming call from Patient who request that Dr.  Norberta Keens prescribe a spray that to assist Patient to breath easier.  Patient completed the Deltasone 10mg .  And is still using the nebulizer.  Patient just request something to help her breathe easier. Patient request call on home phone.              Susan Collins Female, 73 y.o., 09/15/1946 MRN:  [id number] Phone:  [phone] (H) PCP:  Isaac Bliss, Rayford Halsted, MD Coverage:  [facility] Medicare/[employer] [place] [facility] 05/24/2019 at 9:00 AM Message from [name] sent at 04/19/2019 1:56 PM EDT  Patient called to say that she have completed the predniSONE (DELTASONE) 10 MG tablet given to her by the Pulmonologist and still using her nebulizer quite a bit but still having issues with her breathing. Patient request a call back at Ph# [phone]   Call History   Type Contact Phone User  04/19/2019 01:56 PM EDT Phone (Incoming) Susan, Collins (Self) [phone] Lemmie Evens) [name]  04/19/2019 01:55 PM EDT Phone (Incoming) [name], [name] (Self) [phone] Lemmie Evens) [name]    Reason for Disposition . [1] MODERATE longstanding difficulty breathing (e.g., speaks in phrases, SOB even at rest, pulse 100-120) AND [2] SAME as normal  Answer Assessment - Initial Assessment Questions 1. RESPIRATORY STATUS: "Describe your breathing?" (e.g., wheezing, shortness of breath, unable to speak, severe coughing)      Not getting enough air in lungs short breath 2. ONSET: "When did this breathing problem begin?"       3. PATTERN "Does the difficult breathing come and go, or has it been constant since it started?"      constant 4. SEVERITY: "How bad is your breathing?" (e.g., mild, moderate, severe)    - MILD: No SOB at rest, mild SOB with walking, speaks normally in sentences, can lay down, no retractions, pulse < 100.    - MODERATE: SOB at rest, SOB with minimal exertion and prefers to sit, cannot lie down  flat, speaks in phrases, mild retractions, audible wheezing, pulse 100-120.    - SEVERE: Very SOB at rest, speaks in single words, struggling to breathe, sitting hunched forward, retractions, pulse > 120      moderate5. RECURRENT SYMPTOM: "Have you had difficulty breathing before?" If so, ask: "When was the last time?" and "What happened that time?"       6. CARDIAC HISTORY: "Do you have any history of heart disease?" (e.g., heart attack, angina, bypass surgery, angioplasty)      COPD 7. LUNG HISTORY: "Do you have any history of lung disease?"  (e.g., pulmonary embolus, asthma, emphysema)     asthma 8. CAUSE: "What do you think is causing the breathing problem?"      *No Answer* 9. OTHER SYMPTOMS: "Do you have any other symptoms? (e.g., dizziness, runny nose, cough, chest pain, fever)     Denies get tired easily 10. PREGNANCY: "Is there any chance you are pregnant?" "When was your last menstrual period?"      na 11. TRAVEL: "Have you traveled out of the country in the last month?" (e.g., travel history, exposures)       denies  Protocols used: BREATHING DIFFICULTY-A-AH

## 2019-04-19 NOTE — Telephone Encounter (Signed)
Spoke with patient and explained that Dr Jerilee Hoh is not in the office this week.  Advised patient to call her pulmonologist.  Patient verbally agrees.

## 2019-04-19 NOTE — Telephone Encounter (Signed)
See phone note

## 2019-04-26 ENCOUNTER — Telehealth: Payer: Self-pay | Admitting: Pulmonary Disease

## 2019-04-26 NOTE — Telephone Encounter (Signed)
Spoke with pt. She states that she feels like she is having a COPD flare. Wants an appointment with Dr. Halford Chessman. Advised her that Dr. Halford Chessman does not have any available appointments until August. Pt has been scheduled with Beth on 04/27/2019 at 0900. Nothing further was needed.

## 2019-04-27 ENCOUNTER — Other Ambulatory Visit: Payer: Self-pay

## 2019-04-27 ENCOUNTER — Encounter: Payer: Self-pay | Admitting: Primary Care

## 2019-04-27 ENCOUNTER — Ambulatory Visit (INDEPENDENT_AMBULATORY_CARE_PROVIDER_SITE_OTHER): Payer: Medicare HMO | Admitting: Primary Care

## 2019-04-27 DIAGNOSIS — J449 Chronic obstructive pulmonary disease, unspecified: Secondary | ICD-10-CM

## 2019-04-27 MED ORDER — TRELEGY ELLIPTA 100-62.5-25 MCG/INH IN AEPB: 1.0000 | INHALATION_SPRAY | Freq: Every day | RESPIRATORY_TRACT | 0 refills | Status: DC

## 2019-04-27 NOTE — Assessment & Plan Note (Addendum)
-   Patient appears at her baseline, increased gradual shortness of breath/fatigue over the last few months. More consistent with progression of disease than acute exacerbation. - No significant cough or wheezing on exam - No indication for further oral prednisone   - Plan change Anoro to TRELEGY 1 puff daily  - May consider adding daliresp in the future if needed  - Follow up in 2 weeks with NP

## 2019-04-27 NOTE — Progress Notes (Signed)
Trelegy was reviewed with the patient, including name, instructions, indication, goals of therapy, potential side effects, importance of adherence, and safe use, including rinsing out mouth after use.  Patient reports no known adherence challenges.  Patient verbalized understanding by repeating back information and was advised to contact me if medication-related questions arise. Patient was also provided an information handout.

## 2019-04-27 NOTE — Addendum Note (Signed)
Addended by: Valerie Salts on: 04/27/2019 11:09 AM   Modules accepted: Orders

## 2019-04-27 NOTE — Patient Instructions (Addendum)
STOP ANORO  START TRELEGY- take 1 puff daily  (sample given)  Back down NEBULIZER to three times a day   Follow up in 2 weeks with NP of

## 2019-04-27 NOTE — Progress Notes (Signed)
@Patient  ID: Susan Collins, female    DOB: 04-05-1946, 73 y.o.   MRN: [id number]  Chief Complaint  Patient presents with  . Follow-up    Increased SOB for the past week. Denied any fevers. States she is believes this is a COPD exacerbation. Denies any wheezing. States she has to breathe deeper to catch her breath. No change in cough.     Referring provider: Isaac Bliss, [place]*  HPI: 73 year old female, former smoker. PMH significant for COPD, emphysema, HLD, diverticulosis, neuropathy, colon polyps. Patient of Dr. Halford Chessman, last seen by pulmonary nurse practitioner on 03/30/19. CXR on 6/16 showed no acute abnormality, COPD.   04/27/2019 Patient presents today for acute visit with complaints of increased shortness of breath. She is hard of hearing. Reports feeling well and is just here for a check up. Start that she got pretty short of breath this morning while getting her coffee before using her Anoro. She is using her albuterol nebulizer 4 times a day but thinks she can back down to three times a day. She has been treated for two possible COPD "flares" with oral prednisone. She does not have an active cough, rarely gets up clear mucus. Denies cough or wheezing. Afebrile.   Allergies  Allergen Reactions  . Sulfonamide Derivatives     REACTION: headache    Immunization History  Administered Date(s) Administered  . Influenza, High Dose Seasonal PF 10/22/2018  . Influenza,inj,Quad PF,6+ Mos 07/20/2013  . Pneumococcal Conjugate-13 08/08/2014  . Pneumococcal Polysaccharide-23 07/20/2013  . Td 04/19/2009  . Zoster 08/08/2013    Past Medical History:  Diagnosis Date  . CHEST PAIN 11/20/2008  . COLONIC POLYPS, HX OF 05/24/2007  . Diverticular disease   . DYSPNEA ON EXERTION 12/19/2008  . HYPERCHOLESTEROLEMIA 09/18/2008  . 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 Peripheral neuropathy   . POSTMENOPAUSAL SYNDROME 04/12/2008  . 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 Packs/day: 1.00  . Years: 53.00  . Pack years: 53.00  . Types: Cigarettes  . Quit date: 10/13/1996  . Years since quitting: 22.5  Smokeless Tobacco Never Used  Tobacco Comment   smoked 1-1.5 PPD    Counseling given: Not Answered Comment: smoked 1-1.5 PPD    Outpatient Medications Prior to Visit  Medication Sig Dispense Refill  . albuterol (PROVENTIL) (2.5 MG/3ML) 0.083% nebulizer solution Take 3 mLs (2.5 mg total) by nebulization every 6 (six) hours as needed for wheezing or shortness of breath. 75 mL 12  . albuterol (VENTOLIN HFA) 108 (90 Base) MCG/ACT inhaler INHALE TWO PUFFS BY MOUTH EVERY 6 HOURS AS NEEDED 18 each 3  . Cholecalciferol (VITAMIN D3) 1000 units CAPS Take 1 capsule by mouth daily.    . simvastatin (ZOCOR) 40 MG tablet Take 1 tablet (40 mg total) by mouth daily. 90 tablet 4  . triamcinolone (NASACORT ALLERGY 24HR) 55 MCG/ACT AERO nasal inhaler Place 2 sprays into the nose daily. 1 Inhaler 12  . umeclidinium-vilanterol (ANORO ELLIPTA) 62.5-25 MCG/INH AEPB Inhale 1 puff into the lungs daily. 1 each 5  . azithromycin (ZITHROMAX) 250 MG tablet Take 1 tablet (250 mg total) by mouth daily. Take first 2 tablets together, then 1 every day until finished. 6 tablet 0  . predniSONE (DELTASONE) 10 MG tablet Take 4 tabs for 2 days, then 3 tabs for 2 days, then 2 tabs for 2 days, then 1 tab for 2  days, then stop 20 tablet 0   No facility-administered medications prior to visit.    Review of Systems  Review of Systems  Constitutional: Negative.   HENT: Negative.   Respiratory: Positive for shortness of breath. Negative for cough and wheezing.   Cardiovascular: Negative.    Physical Exam  BP 112/60   Pulse 64   Temp 98.3 F (36.8 C) (Oral)   Ht 5\' 3"  (1.6 m)   Wt 143 lb 12.8 oz (65.2 kg)   SpO2 97%   BMI 25.47 kg/m  Physical Exam Constitutional:      Appearance: Normal appearance.  HENT:      Head: Normocephalic and atraumatic.     Ears:     Comments: Very HOH Cardiovascular:     Rate and Rhythm: Normal rate and regular rhythm.  Pulmonary:     Effort: Pulmonary effort is normal. No respiratory distress.     Breath sounds: No wheezing or rhonchi.  Skin:    General: Skin is warm and dry.  Neurological:     General: No focal deficit present.     Mental Status: She is alert. Mental status is at baseline.  Psychiatric:        Mood and Affect: Mood normal.        Behavior: Behavior normal.      Lab Results:  CBC    Component Value Date/Time   WBC 7.3 04/27/2018 0940   RBC 3.78 (L) 04/27/2018 0940   HGB 11.5 (L) 04/27/2018 0940   HCT 34.5 (L) 04/27/2018 0940   PLT 225.0 04/27/2018 0940   MCV 91.0 04/27/2018 0940   MCHC 33.3 04/27/2018 0940   RDW 13.7 04/27/2018 0940   LYMPHSABS 2.5 04/27/2018 0940   MONOABS 0.6 04/27/2018 0940   EOSABS 0.3 04/27/2018 0940   BASOSABS 0.1 04/27/2018 0940    BMET    Component Value Date/Time   NA 139 03/16/2018 1311   K 4.7 03/16/2018 1311   CL 103 03/16/2018 1311   CO2 29 03/16/2018 1311   GLUCOSE 114 (H) 03/16/2018 1311   BUN 21 03/16/2018 1311   CREATININE 0.79 03/16/2018 1311   CALCIUM 10.5 03/16/2018 1311   GFRNONAA 82.17 06/13/2010 0934   GFRAA 109 04/12/2008 0000    BNP No results found for: BNP  ProBNP No results found for: PROBNP  Imaging: Dg Chest 2 View  Result Date: 03/29/2019 CLINICAL DATA:  Progressive shortness of breath. COPD. Bilateral rales. EXAM: CHEST - 2 VIEW COMPARISON:  10/20/2018 FINDINGS: The heart size and pulmonary vascularity are normal. Aortic atherosclerosis. Interstitial markings are diffusely accentuated on a chronic basis. No consolidative infiltrates or effusions. The lungs are hyperinflated with flattening of the diaphragm consistent with the history of COPD. No significant bone abnormality. IMPRESSION: No acute abnormality.  COPD. Aortic Atherosclerosis (ICD10-I70.0). Electronically  Signed   By: Lorriane Shire M.D.   On: 03/29/2019 12:23     Assessment & Plan:   COPD with asthma - Patient appears at her baseline, increased gradual shortness of breath/fatigue over the last few months. More consistent with progression of disease than acute exacerbation. - No significant cough or wheezing on exam - No indication for further oral prednisone   - Plan change Anoro to TRELEGY 1 puff daily  - May consider adding daliresp in the future if needed  - Follow up in 2 weeks with NP      Martyn Ehrich, NP 04/27/2019

## 2019-05-02 ENCOUNTER — Encounter: Payer: Self-pay | Admitting: Family Medicine

## 2019-05-02 ENCOUNTER — Ambulatory Visit (INDEPENDENT_AMBULATORY_CARE_PROVIDER_SITE_OTHER): Payer: Medicare HMO | Admitting: Family Medicine

## 2019-05-02 ENCOUNTER — Other Ambulatory Visit: Payer: Self-pay

## 2019-05-02 ENCOUNTER — Telehealth: Payer: Self-pay | Admitting: Internal Medicine

## 2019-05-02 VITALS — BP 118/42 | HR 83 | Wt 144.4 lb

## 2019-05-02 DIAGNOSIS — M545 Low back pain, unspecified: Secondary | ICD-10-CM

## 2019-05-02 MED ORDER — CYCLOBENZAPRINE HCL 10 MG PO TABS: 10.0000 mg | ORAL_TABLET | Freq: Three times a day (TID) | ORAL | 0 refills | Status: DC | PRN

## 2019-05-02 MED ORDER — DICLOFENAC SODIUM 75 MG PO TBEC: 75.0000 mg | DELAYED_RELEASE_TABLET | Freq: Two times a day (BID) | ORAL | 0 refills | Status: DC

## 2019-05-02 NOTE — Progress Notes (Signed)
   Subjective:    [name], female    DOB: 1946/08/18, 73 y.o.   MRN: [id number]  HPI Here for 10 days of lower back pain and spasms. No pain or weakness or numbness in the legs. No recent trauma. She has tried Tylenol, Ibuprofen, heat, and Ephraim Hamburger with mixed results. She is S/P a lumbar laminectomy in 2001.    Review of Systems  Constitutional: Negative.   Respiratory: Negative.   Cardiovascular: Negative.   Gastrointestinal: Negative.   Genitourinary: Negative.   Musculoskeletal: Positive for back pain.       Objective:   Physical Exam Constitutional:      Appearance: Normal appearance.  Cardiovascular:     Rate and Rhythm: Normal rate and regular rhythm.     Pulses: Normal pulses.     Heart sounds: Normal heart sounds.  Pulmonary:     Effort: Pulmonary effort is normal.     Breath sounds: Normal breath sounds.  Musculoskeletal:     Comments: She is tender over the lower spine and there is a lot of spasm of the paraspinous muscles. ROM is limited. Negative SLR    Neurological:     Mental Status: She is alert.           Assessment & Plan:  Low back pain with spasms. Try Diclofenac and Flexeril. Recheck prn.  Alysia Penna, MD

## 2019-05-02 NOTE — Telephone Encounter (Signed)
Patient is calling to see if she can get a copy of her AVS mailed to her today. Thank you

## 2019-05-03 NOTE — Telephone Encounter (Signed)
AVS has been mailed. Patient is aware.

## 2019-05-06 ENCOUNTER — Telehealth: Payer: Self-pay

## 2019-05-06 NOTE — Telephone Encounter (Signed)
PA Approved for Cyclobenzaprine patient notified.

## 2019-05-11 ENCOUNTER — Other Ambulatory Visit: Payer: Self-pay

## 2019-05-11 ENCOUNTER — Ambulatory Visit: Payer: Medicare HMO | Admitting: Primary Care

## 2019-05-11 ENCOUNTER — Encounter: Payer: Self-pay | Admitting: Primary Care

## 2019-05-11 DIAGNOSIS — J449 Chronic obstructive pulmonary disease, unspecified: Secondary | ICD-10-CM | POA: Diagnosis not present

## 2019-05-11 MED ORDER — TRELEGY ELLIPTA 100-62.5-25 MCG/INH IN AEPB: 1.0000 | INHALATION_SPRAY | Freq: Every day | RESPIRATORY_TRACT | 5 refills | Status: DC

## 2019-05-11 NOTE — Patient Instructions (Addendum)
Nice seeing you today, I am glad you are doing better!  Recommendations: Continue Trelegy 1 puff daily Use albuterol rescue inhaler 2 puffs every 4-6 hours for breakthrough shortness of breath/wheezing   Rx: Sending in prescription for trelegy (if too expensive please call and let us know and we will try two different inhalers)  Follow-up: 3-4 months with Dr. Halford Chessman OR sooner if symptoms worsen

## 2019-05-11 NOTE — Assessment & Plan Note (Addendum)
-   Improved - Continue Trelegy 1 puff daily (RX sent) - Albuterol hfa 2 puffs every 4-6 hours as needed for sob/wheezing  - FU in 3-4 months with Dr. Halford Chessman or sooner if needed

## 2019-05-11 NOTE — Progress Notes (Signed)
@Patient  ID: Susan Collins, female    DOB: 12-14-1945, 73 y.o.   MRN: [id number]  Chief Complaint  Patient presents with  . Follow-up    Patient states that her sob is alot better since twitching to Trelegy.     Referring provider: Isaac Bliss, [place]*  HPI: 73 year old female, former smoker. PMH significant for COPD with asthma, dyslipidemia, osteopenia. Patient of Dr. Halford Chessman, last seen by pulmonary NP on 04/27/19. Treated for COPD exacerbation x2-3 times since early June. Symptoms more consistent with progression of disease. Anoro changed to Trelegy and recommended 2 week follow-up.  05/11/2019 Patient presents today for 2 week follow-up. She feels her breathing is a lot better since starting Trelegy. Very little wheezing. She has decreased Albuterol use to twice a day. Denies cough.     Allergies  Allergen Reactions  . Sulfonamide Derivatives     REACTION: headache    Immunization History  Administered Date(s) Administered  . Influenza, High Dose Seasonal PF 10/22/2018  . Influenza,inj,Quad PF,6+ Mos 07/20/2013  . Pneumococcal Conjugate-13 08/08/2014  . Pneumococcal Polysaccharide-23 07/20/2013  . Td 04/19/2009  . Zoster 08/08/2013    Past Medical History:  Diagnosis Date  . CHEST PAIN 11/20/2008  . COLONIC POLYPS, HX OF 05/24/2007  . Diverticular disease   . DYSPNEA ON EXERTION 12/19/2008  . HYPERCHOLESTEROLEMIA 09/18/2008  . 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 Peripheral neuropathy   . POSTMENOPAUSAL SYNDROME 04/12/2008  . 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 Packs/day: 1.00  . Years: 53.00  . Pack years: 53.00  . Types: Cigarettes  . Quit date: 10/13/1996  . Years since quitting: 22.5  Smokeless Tobacco Never Used  Tobacco Comment   smoked 1-1.5 PPD    Counseling given: Not Answered Comment: smoked 1-1.5 PPD    Outpatient Medications Prior to Visit   Medication Sig Dispense Refill  . albuterol (PROVENTIL) (2.5 MG/3ML) 0.083% nebulizer solution Take 3 mLs (2.5 mg total) by nebulization every 6 (six) hours as needed for wheezing or shortness of breath. 75 mL 12  . albuterol (VENTOLIN HFA) 108 (90 Base) MCG/ACT inhaler INHALE TWO PUFFS BY MOUTH EVERY 6 HOURS AS NEEDED 18 each 3  . Cholecalciferol (VITAMIN D3) 1000 units CAPS Take 1 capsule by mouth daily.    . cyclobenzaprine (FLEXERIL) 10 MG tablet Take 1 tablet (10 mg total) by mouth 3 (three) times daily as needed for muscle spasms. 60 tablet 0  . diclofenac (VOLTAREN) 75 MG EC tablet Take 1 tablet (75 mg total) by mouth 2 (two) times daily. 60 tablet 0  . Fluticasone-Umeclidin-Vilant (TRELEGY ELLIPTA) 100-62.5-25 MCG/INH AEPB Inhale 1 puff into the lungs daily. 1 each 0  . simvastatin (ZOCOR) 40 MG tablet Take 1 tablet (40 mg total) by mouth daily. 90 tablet 4  . triamcinolone (NASACORT ALLERGY 24HR) 55 MCG/ACT AERO nasal inhaler Place 2 sprays into the nose daily. 1 Inhaler 12  . umeclidinium-vilanterol (ANORO ELLIPTA) 62.5-25 MCG/INH AEPB Inhale 1 puff into the lungs daily. 1 each 5   No facility-administered medications prior to visit.    Review of Systems  Review of Systems  Constitutional: Negative.   HENT: Negative.   Respiratory: Positive for wheezing. Negative for cough and shortness of breath.   Cardiovascular: Negative.    Physical Exam  BP 116/70 (BP Location: Left Arm, Cuff Size: Normal)   Pulse 74   Temp 98.2  F (36.8 C) (Oral)   Ht 5\' 3"  (1.6 m)   Wt 146 lb (66.2 kg)   SpO2 97%   BMI 25.86 kg/m  Physical Exam Constitutional:      General: She is not in acute distress.    Appearance: Normal appearance. She is not ill-appearing.  HENT:     Head: Normocephalic and atraumatic.     Comments: HOH    Left Ear: Tympanic membrane normal.     Mouth/Throat:     Mouth: Mucous membranes are moist.     Pharynx: Oropharynx is clear.  Neck:     Musculoskeletal: Normal  range of motion.  Cardiovascular:     Rate and Rhythm: Normal rate and regular rhythm.  Pulmonary:     Effort: Pulmonary effort is normal.     Breath sounds: Normal breath sounds. No wheezing or rhonchi.     Comments: CTA Musculoskeletal: Normal range of motion.  Neurological:     General: No focal deficit present.     Mental Status: She is alert and oriented to person, place, and time. Mental status is at baseline.  Psychiatric:        Mood and Affect: Mood normal.        Behavior: Behavior normal.        Thought Content: Thought content normal.        Judgment: Judgment normal.      Lab Results:  CBC    Component Value Date/Time   WBC 7.3 04/27/2018 0940   RBC 3.78 (L) 04/27/2018 0940   HGB 11.5 (L) 04/27/2018 0940   HCT 34.5 (L) 04/27/2018 0940   PLT 225.0 04/27/2018 0940   MCV 91.0 04/27/2018 0940   MCHC 33.3 04/27/2018 0940   RDW 13.7 04/27/2018 0940   LYMPHSABS 2.5 04/27/2018 0940   MONOABS 0.6 04/27/2018 0940   EOSABS 0.3 04/27/2018 0940   BASOSABS 0.1 04/27/2018 0940    BMET    Component Value Date/Time   NA 139 03/16/2018 1311   K 4.7 03/16/2018 1311   CL 103 03/16/2018 1311   CO2 29 03/16/2018 1311   GLUCOSE 114 (H) 03/16/2018 1311   BUN 21 03/16/2018 1311   CREATININE 0.79 03/16/2018 1311   CALCIUM 10.5 03/16/2018 1311   GFRNONAA 82.17 06/13/2010 0934   GFRAA 109 04/12/2008 0000    BNP No results found for: BNP  ProBNP No results found for: PROBNP  Imaging: No results found.   Assessment & Plan:   COPD with asthma - Improved - Continue Trelegy 1 puff daily (RX sent) - Albuterol hfa 2 puffs every 4-6 hours as needed for sob/wheezing  - FU in 3-4 months with Dr. Halford Chessman or sooner if needed   Martyn Ehrich, NP 05/11/2019

## 2019-05-23 ENCOUNTER — Other Ambulatory Visit: Payer: Self-pay | Admitting: Family Medicine

## 2019-05-23 NOTE — Telephone Encounter (Signed)
Last filled 05/02/2019 Last OV 05/02/19  Ok to fill?

## 2019-05-23 NOTE — Telephone Encounter (Signed)
She should follow up with Dr. Jerilee Hoh for her back pain before any refills are given

## 2019-05-23 NOTE — Telephone Encounter (Signed)
Hernandez Pt

## 2019-05-24 ENCOUNTER — Encounter: Payer: Medicare HMO | Admitting: Internal Medicine

## 2019-06-02 DIAGNOSIS — J00 Acute nasopharyngitis [common cold]: Secondary | ICD-10-CM | POA: Insufficient documentation

## 2019-06-02 DIAGNOSIS — Z8639 Personal history of other endocrine, nutritional and metabolic disease: Secondary | ICD-10-CM | POA: Diagnosis not present

## 2019-06-02 DIAGNOSIS — G609 Hereditary and idiopathic neuropathy, unspecified: Secondary | ICD-10-CM | POA: Insufficient documentation

## 2019-06-02 DIAGNOSIS — D649 Anemia, unspecified: Secondary | ICD-10-CM | POA: Insufficient documentation

## 2019-06-02 DIAGNOSIS — M8580 Other specified disorders of bone density and structure, unspecified site: Secondary | ICD-10-CM | POA: Diagnosis not present

## 2019-06-02 DIAGNOSIS — E785 Hyperlipidemia, unspecified: Secondary | ICD-10-CM | POA: Diagnosis not present

## 2019-06-02 DIAGNOSIS — Z87891 Personal history of nicotine dependence: Secondary | ICD-10-CM | POA: Diagnosis not present

## 2019-06-02 DIAGNOSIS — J449 Chronic obstructive pulmonary disease, unspecified: Secondary | ICD-10-CM | POA: Diagnosis not present

## 2019-06-02 DIAGNOSIS — Z85828 Personal history of other malignant neoplasm of skin: Secondary | ICD-10-CM | POA: Insufficient documentation

## 2019-06-02 DIAGNOSIS — Z1331 Encounter for screening for depression: Secondary | ICD-10-CM | POA: Diagnosis not present

## 2019-06-02 DIAGNOSIS — M899 Disorder of bone, unspecified: Secondary | ICD-10-CM | POA: Insufficient documentation

## 2019-06-02 DIAGNOSIS — F17200 Nicotine dependence, unspecified, uncomplicated: Secondary | ICD-10-CM | POA: Insufficient documentation

## 2019-06-02 NOTE — Progress Notes (Signed)
Reviewed and agree with assessment/plan.   Leisha Trinkle, MD [facility] 10/08/2016, 12:24 PM Pager:  [phone]  

## 2019-06-04 NOTE — Progress Notes (Signed)
Reviewed and agree with assessment/plan.   Carnell Beavers, MD [facility] 10/08/2016, 12:24 PM Pager:  [phone]  

## 2019-06-04 NOTE — Progress Notes (Signed)
Reviewed and agree with assessment/plan.   Hero Kulish, MD [facility] 10/08/2016, 12:24 PM Pager:  [phone]  

## 2019-06-09 DIAGNOSIS — D649 Anemia, unspecified: Secondary | ICD-10-CM | POA: Diagnosis not present

## 2019-06-09 DIAGNOSIS — Z0000 Encounter for general adult medical examination without abnormal findings: Secondary | ICD-10-CM | POA: Diagnosis not present

## 2019-07-19 DIAGNOSIS — Z87891 Personal history of nicotine dependence: Secondary | ICD-10-CM | POA: Diagnosis not present

## 2019-07-19 DIAGNOSIS — J449 Chronic obstructive pulmonary disease, unspecified: Secondary | ICD-10-CM | POA: Diagnosis not present

## 2019-07-19 DIAGNOSIS — J441 Chronic obstructive pulmonary disease with (acute) exacerbation: Secondary | ICD-10-CM | POA: Diagnosis not present

## 2019-07-21 ENCOUNTER — Other Ambulatory Visit: Payer: Self-pay | Admitting: Internal Medicine

## 2019-07-25 DIAGNOSIS — Z1231 Encounter for screening mammogram for malignant neoplasm of breast: Secondary | ICD-10-CM | POA: Diagnosis not present

## 2019-07-29 ENCOUNTER — Telehealth: Payer: Self-pay | Admitting: Pulmonary Disease

## 2019-07-29 NOTE — Telephone Encounter (Signed)
Spoke with pt. States that she wants to switch from Dr. Halford Chessman to Dr. Annamaria Boots. Stated, "Dr. Annamaria Boots is a lung specialist and that's what I need." Explained to the pt that Dr. Halford Chessman is also a lung specialist. Pt didn't realize that and stated that she will just keep Dr. Halford Chessman as her primary pulmonologist. Nothing further was needed at this time.

## 2019-08-02 ENCOUNTER — Telehealth: Payer: Self-pay | Admitting: Primary Care

## 2019-08-02 MED ORDER — TRELEGY ELLIPTA 100-62.5-25 MCG/INH IN AEPB: 1.0000 | INHALATION_SPRAY | Freq: Every day | RESPIRATORY_TRACT | 0 refills | Status: DC

## 2019-08-02 NOTE — Telephone Encounter (Signed)
Spoke with pt's husband and he states the Trelegy is too expensive. I advised him to retrieve a formulary from his insurance so we could see what they cover but I warned him that it may just be that she is in the doughnut hole. I left samples up fron for them to pick up until we can figure out what to do next. BW which inhaler would you prefer pt try?

## 2019-08-03 NOTE — Telephone Encounter (Signed)
Having them call insurance and see what is on formulary. Samples for now, she has an apt next month and we can figure it out

## 2019-08-04 NOTE — Telephone Encounter (Signed)
Called and spoke with patient and patient's husband.  Reviewed with them about obtaining formulary from insurance. Husband stated that they currently have enough medication and samples are not needed at this time. Nothing further needed.

## 2019-08-05 ENCOUNTER — Telehealth: Payer: Self-pay | Admitting: Pulmonary Disease

## 2019-08-05 NOTE — Telephone Encounter (Signed)
ATC patient unable to reach LM to call back office (x1)  

## 2019-08-08 NOTE — Telephone Encounter (Signed)
Spoke with Susan Collins, I advised her that Dr. Annamaria Boots wasn't accepting new patients and may be retiring in the next couple of years. Susan Collins understood and stated she would stay with [facility]. Nothing further is needed.

## 2019-08-24 ENCOUNTER — Telehealth: Payer: Self-pay | Admitting: Pulmonary Disease

## 2019-08-24 NOTE — Telephone Encounter (Signed)
Called and spoke with pt who said that her breathing began getting worse today 11/11. Pt said that she has been using her humidifier and states due to the damp air, she has been having some problems.  Pt said with the humidifier going, her breathing is doing good. Asked pt if she felt like she needed any meds to help with her breathing and pt said at this time she is good and she said if her breathing became worse and she felt like she needing something, she would call us back.  Routing to [facility] as an [place].

## 2019-08-25 NOTE — Telephone Encounter (Signed)
Noted  

## 2019-08-27 ENCOUNTER — Other Ambulatory Visit: Payer: Self-pay | Admitting: Family Medicine

## 2019-09-12 ENCOUNTER — Ambulatory Visit: Payer: Medicare HMO | Admitting: Primary Care

## 2019-09-14 ENCOUNTER — Ambulatory Visit (INDEPENDENT_AMBULATORY_CARE_PROVIDER_SITE_OTHER): Payer: Medicare HMO | Admitting: Primary Care

## 2019-09-14 ENCOUNTER — Other Ambulatory Visit: Payer: Self-pay

## 2019-09-14 ENCOUNTER — Encounter: Payer: Self-pay | Admitting: Primary Care

## 2019-09-14 DIAGNOSIS — J449 Chronic obstructive pulmonary disease, unspecified: Secondary | ICD-10-CM | POA: Diagnosis not present

## 2019-09-14 NOTE — Patient Instructions (Addendum)
Continue Trelegy 1 puff daily Use albuterol rescue inhaler or nebulizer every 6 hours for shortness of breath/wheezing  Follow-up: 3-4 months with Dr. Halford Chessman

## 2019-09-14 NOTE — Progress Notes (Signed)
Reviewed and agree with assessment/plan.   Nicle Connole, MD [facility] 10/08/2016, 12:24 PM Pager:  [phone]  

## 2019-09-14 NOTE — Progress Notes (Signed)
Virtual Visit via Telephone Note  I connected with Susan Collins on 09/14/19 at 10:30 AM EST by telephone and verified that I am speaking with the correct person using two identifiers.  Location: Patient: Home Provider: Office   I discussed the limitations, risks, security and privacy concerns of performing an evaluation and management service by telephone and the availability of in person appointments. I also discussed with the patient that there may be a patient responsible charge related to this service. The patient expressed understanding and agreed to proceed.  History of Present Illness: 73 year old female, former smoker (53 pack year). PMH significant for COPD with asthma, emphysema, dyslipidemia, osteopenia. Patient of Dr. Halford Chessman. Treated for COPD exacerbation x2-3 times since early June. Symptoms more consistent with progression of disease. Anoro changed to Trelegy.    09/14/2019 Patient contacted today for follow-up televisit. She feels Trelegy is working great for her. No breathing difficulties. She has a rare dry cough. States that the weather can affect her symptoms. No exacerbations since this summer. Uses albuterol nebulizer twice a day as maintenance.  Denies sob, chest tightness or wheezing. She believes that she received influenza vaccine this year at [facility] on [address].  Observations/Objective:  - Sounds well. Able to speak in full sentences - No significant shortness of breath, wheezing or cough noted during conversation  Pulmonary testing: PFT 03/21/15 >> FEV1 1.43 (66%), FEV1% 66, TLC 5.56 (113%), DLCO 52%, + BD  Assessment and Plan:  COPD with asthma: - Symptoms are well controlled, no recent exacerbations  - Continue Trelegy 1 puff daily; prn albuterol hfa/neb q 6 hours   Follow Up Instructions:   - 3-4 months with Dr. Halford Chessman  I discussed the assessment and treatment plan with the patient. The patient was provided an opportunity to ask questions and all were  answered. The patient agreed with the plan and demonstrated an understanding of the instructions.   The patient was advised to call back or seek an in-person evaluation if the symptoms worsen or if the condition fails to improve as anticipated.  I provided 15 minutes of non-face-to-face time during this encounter.   Martyn Ehrich, NP

## 2019-10-12 DIAGNOSIS — Z87891 Personal history of nicotine dependence: Secondary | ICD-10-CM | POA: Diagnosis not present

## 2019-10-12 DIAGNOSIS — G609 Hereditary and idiopathic neuropathy, unspecified: Secondary | ICD-10-CM | POA: Diagnosis not present

## 2019-10-12 DIAGNOSIS — J302 Other seasonal allergic rhinitis: Secondary | ICD-10-CM | POA: Diagnosis not present

## 2019-10-12 DIAGNOSIS — J449 Chronic obstructive pulmonary disease, unspecified: Secondary | ICD-10-CM | POA: Diagnosis not present

## 2019-10-17 ENCOUNTER — Telehealth: Payer: Self-pay | Admitting: Pulmonary Disease

## 2019-10-17 NOTE — Telephone Encounter (Signed)
Called and spoke with pt who stated she has had increased SOB and wanted to know if she could use her trelegy more than once a day. I stated to her that the trelegy is once a day inhaler but stated to her that her ventolin inhaler could be used every 6 hours if needed and she verbalized understanding. Nothing further needed.

## 2019-10-24 DIAGNOSIS — H5203 Hypermetropia, bilateral: Secondary | ICD-10-CM | POA: Diagnosis not present

## 2019-10-27 DIAGNOSIS — Z0000 Encounter for general adult medical examination without abnormal findings: Secondary | ICD-10-CM | POA: Insufficient documentation

## 2019-10-27 DIAGNOSIS — M859 Disorder of bone density and structure, unspecified: Secondary | ICD-10-CM | POA: Diagnosis not present

## 2019-10-27 DIAGNOSIS — E7849 Other hyperlipidemia: Secondary | ICD-10-CM | POA: Diagnosis not present

## 2019-11-02 DIAGNOSIS — Z23 Encounter for immunization: Secondary | ICD-10-CM | POA: Diagnosis not present

## 2019-11-02 DIAGNOSIS — G609 Hereditary and idiopathic neuropathy, unspecified: Secondary | ICD-10-CM | POA: Diagnosis not present

## 2019-11-02 DIAGNOSIS — Z86010 Personal history of colonic polyps: Secondary | ICD-10-CM | POA: Diagnosis not present

## 2019-11-02 DIAGNOSIS — D6489 Other specified anemias: Secondary | ICD-10-CM | POA: Diagnosis not present

## 2019-11-02 DIAGNOSIS — Z0000 Encounter for general adult medical examination without abnormal findings: Secondary | ICD-10-CM | POA: Diagnosis not present

## 2019-11-02 DIAGNOSIS — R413 Other amnesia: Secondary | ICD-10-CM | POA: Insufficient documentation

## 2019-11-02 DIAGNOSIS — E7849 Other hyperlipidemia: Secondary | ICD-10-CM | POA: Diagnosis not present

## 2019-11-02 DIAGNOSIS — E559 Vitamin D deficiency, unspecified: Secondary | ICD-10-CM | POA: Diagnosis not present

## 2019-11-02 DIAGNOSIS — J449 Chronic obstructive pulmonary disease, unspecified: Secondary | ICD-10-CM | POA: Diagnosis not present

## 2019-11-02 DIAGNOSIS — Z87891 Personal history of nicotine dependence: Secondary | ICD-10-CM | POA: Diagnosis not present

## 2019-11-02 DIAGNOSIS — M859 Disorder of bone density and structure, unspecified: Secondary | ICD-10-CM | POA: Diagnosis not present

## 2020-01-18 DIAGNOSIS — J302 Other seasonal allergic rhinitis: Secondary | ICD-10-CM | POA: Diagnosis not present

## 2020-01-18 DIAGNOSIS — R2681 Unsteadiness on feet: Secondary | ICD-10-CM | POA: Diagnosis not present

## 2020-01-18 DIAGNOSIS — R269 Unspecified abnormalities of gait and mobility: Secondary | ICD-10-CM | POA: Insufficient documentation

## 2020-01-18 DIAGNOSIS — R413 Other amnesia: Secondary | ICD-10-CM | POA: Diagnosis not present

## 2020-01-18 DIAGNOSIS — M859 Disorder of bone density and structure, unspecified: Secondary | ICD-10-CM | POA: Diagnosis not present

## 2020-01-18 DIAGNOSIS — J449 Chronic obstructive pulmonary disease, unspecified: Secondary | ICD-10-CM | POA: Diagnosis not present

## 2020-01-18 DIAGNOSIS — E559 Vitamin D deficiency, unspecified: Secondary | ICD-10-CM | POA: Diagnosis not present

## 2020-02-24 DIAGNOSIS — Z87891 Personal history of nicotine dependence: Secondary | ICD-10-CM | POA: Diagnosis not present

## 2020-02-24 DIAGNOSIS — J449 Chronic obstructive pulmonary disease, unspecified: Secondary | ICD-10-CM | POA: Diagnosis not present

## 2020-02-24 DIAGNOSIS — R0602 Shortness of breath: Secondary | ICD-10-CM | POA: Diagnosis not present

## 2020-03-05 DIAGNOSIS — E860 Dehydration: Secondary | ICD-10-CM | POA: Diagnosis not present

## 2020-03-09 ENCOUNTER — Encounter: Payer: Self-pay | Admitting: Gastroenterology

## 2020-03-23 ENCOUNTER — Other Ambulatory Visit: Payer: Self-pay

## 2020-03-23 ENCOUNTER — Institutional Professional Consult (permissible substitution): Payer: Medicare HMO | Admitting: Emergency Medicine

## 2020-03-27 ENCOUNTER — Ambulatory Visit (INDEPENDENT_AMBULATORY_CARE_PROVIDER_SITE_OTHER): Payer: Medicare HMO | Admitting: Primary Care

## 2020-03-27 ENCOUNTER — Other Ambulatory Visit: Payer: Self-pay

## 2020-03-27 DIAGNOSIS — J449 Chronic obstructive pulmonary disease, unspecified: Secondary | ICD-10-CM | POA: Diagnosis not present

## 2020-03-27 DIAGNOSIS — J4489 Other specified chronic obstructive pulmonary disease: Secondary | ICD-10-CM

## 2020-03-27 NOTE — Progress Notes (Signed)
Virtual Visit via Video Note  I connected with Susan Collins on 03/27/20 at  9:00 AM EDT by a video enabled telemedicine application and verified that I am speaking with the correct person using two identifiers.  Location: Patient: Home Provider: Office   I discussed the limitations of evaluation and management by telemedicine and the availability of in person appointments. The patient expressed understanding and agreed to proceed.  History of Present Illness:  74 year old female, former smoker (53 pack year). PMH significant for COPD with asthma, emphysema, dyslipidemia, osteopenia. Patient of Dr. Halford Chessman. Treated for COPD exacerbation x2-3 times since early June 2020. Symptoms more consistent with progression of disease. Anoro changed to Trelegy.    Previous [facility] pulmonary encounter: 09/14/2019 Patient contacted today for follow-up televisit. She feels Trelegy is working great for her. No breathing difficulties. She has a rare dry cough. States that the weather can affect her symptoms. No exacerbations since this summer. Uses albuterol nebulizer twice a day as maintenance.  Denies sob, chest tightness or wheezing. She believes that she received influenza vaccine this year at [facility] on [address].  03/27/2020 Patient contacted today for 3-4 month follow-up televisit. States things are going well. Her breathing is ok. She gets fatigued when doing ADLs. Today she is doing laundry, dishes and picking up around the house and is doing well. She is using Trelegy one puff daily in the morning. She rinses her mouth after use. She uses Albuterol once a day. She is interested in losing weight. She is walking more. She has had one covid vaccine, she goes back June 21st for her second. Looks like she had an apt with Dr. Lamonte Sakai scheduled for new consult on 03/23/20 but this was canceled, she is already established with Dr. Halford Chessman.    Observations/Objective:  - Appears well, no overt shortness of breath, wheezing or  cough - Able to speak in full sentences   Imaging: 03/29/19 CXR- The heart size and pulmonary vascularity are normal. Aortic atherosclerosis. Interstitial markings are diffusely accentuated on a chronic basis. No consolidative infiltrates or effusions. The lungs are hyperinflated with flattening of the diaphragm consistent with the history of COPD. No significant bone abnormality.  Assessment and Plan:  COPD mixed type: - Stable interval, no recent exacerbations. Seems to being doing better on Triple therapy. Reports fatigue with ADLs but recovers with rest  - Continue Trelegy 100 one puff daily; prn albuterol hfa/ nebulizer every 6 hours for sob or wheezing - Encourage weight loss and regular physical exercise  Follow Up Instructions:   - 3-4 months with Dr. Halford Chessman   I discussed the assessment and treatment plan with the patient. The patient was provided an opportunity to ask questions and all were answered. The patient agreed with the plan and demonstrated an understanding of the instructions.   The patient was advised to call back or seek an in-person evaluation if the symptoms worsen or if the condition fails to improve as anticipated.  I provided 18 minutes of non-face-to-face time during this encounter.   Martyn Ehrich, NP

## 2020-03-27 NOTE — Patient Instructions (Signed)
Continue Trelegy one puff daily in the morning  Continue Albuterol rescue inhaler OR nebulizer for breakthrough shortness of breath or wheezing  Follow-up in 3-4 months with Dr. Halford Chessman

## 2020-03-27 NOTE — Progress Notes (Signed)
Reviewed and agree with assessment/plan.   Chesley Mires, MD [facility] Pulmonary/Critical Care 03/27/2020, 1:45 PM Pager:  [phone]

## 2020-05-04 ENCOUNTER — Other Ambulatory Visit: Payer: Self-pay | Admitting: Primary Care

## 2020-05-04 MED ORDER — TRELEGY ELLIPTA 100-62.5-25 MCG/INH IN AEPB: 1.0000 | INHALATION_SPRAY | Freq: Every day | RESPIRATORY_TRACT | 5 refills | Status: DC

## 2020-05-11 ENCOUNTER — Other Ambulatory Visit: Payer: Self-pay | Admitting: *Deleted

## 2020-07-03 ENCOUNTER — Ambulatory Visit: Payer: Medicare HMO | Admitting: Pulmonary Disease

## 2020-07-04 ENCOUNTER — Ambulatory Visit: Payer: Medicare HMO | Admitting: Pulmonary Disease

## 2020-08-08 DIAGNOSIS — E559 Vitamin D deficiency, unspecified: Secondary | ICD-10-CM | POA: Diagnosis not present

## 2020-08-08 DIAGNOSIS — E785 Hyperlipidemia, unspecified: Secondary | ICD-10-CM | POA: Diagnosis not present

## 2020-08-29 ENCOUNTER — Other Ambulatory Visit: Payer: Self-pay

## 2020-08-29 ENCOUNTER — Encounter: Payer: Self-pay | Admitting: Pulmonary Disease

## 2020-08-29 ENCOUNTER — Ambulatory Visit: Payer: Medicare HMO | Admitting: Pulmonary Disease

## 2020-08-29 VITALS — BP 116/72 | HR 79 | Temp 98.2°F | Ht 63.0 in | Wt 152.4 lb

## 2020-08-29 DIAGNOSIS — J432 Centrilobular emphysema: Secondary | ICD-10-CM | POA: Diagnosis not present

## 2020-08-29 MED ORDER — ALBUTEROL SULFATE HFA 108 (90 BASE) MCG/ACT IN AERS: INHALATION_SPRAY | RESPIRATORY_TRACT | 3 refills | Status: DC

## 2020-08-29 MED ORDER — ALBUTEROL SULFATE (2.5 MG/3ML) 0.083% IN NEBU: 2.5000 mg | INHALATION_SOLUTION | Freq: Four times a day (QID) | RESPIRATORY_TRACT | 12 refills | Status: DC | PRN

## 2020-08-29 NOTE — Progress Notes (Signed)
[place] [facility], Critical [facility], and Sleep Medicine  Chief Complaint  Patient presents with  . Follow-up    COPD    Constitutional:  BP 116/72 (BP Location: Left Arm, Cuff Size: Normal)   Pulse 79   Temp 98.2 F (36.8 C) (Oral)   Ht 5\' 3"  (1.6 m)   Wt 152 lb 6.4 oz (69.1 kg)   SpO2 95%   BMI 27.00 kg/m   Past Medical History:  HLD, Neuropathy, Diverticulosis, Tinnitus, Diverticulosis, Colon polyps  Past Surgical History:  Her  has a past surgical history that includes Abdominal hysterectomy; Lumbar laminectomy; Nasal sinus surgery; and Shoulder surgery.  Brief Summary:  Susan Collins is a 75 y.o. female former smoker with COPD/emphysema.      Subjective:   She has been doing well. Not having cough, wheeze, or sputum.  Exercises daily by going for a walk.  Physical Exam:   Appearance - well kempt   ENMT - no sinus tenderness, no oral exudate, no LAN, Mallampati 2 airway, no stridor  Respiratory - equal breath sounds bilaterally, no wheezing or rales  CV - s1s2 regular rate and rhythm, no murmurs  Ext - no clubbing, no edema  Skin - no rashes  Psych - normal mood and affect   Pulmonary testing:   PFT 03/21/15 >> FEV1 1.43 (66%), FEV1% 66, TLC 5.56 (113%), DLCO 52%, + BD  Social History:  She  reports that she quit smoking about 23 years ago. Her smoking use included cigarettes. She has a 53.00 pack-year smoking history. She has never used smokeless tobacco. She reports that she does not drink alcohol and does not use drugs.  Family History:  Her family history includes Bladder Cancer in her brother; COPD in her brother and father; Heart attack in her father; Heart disease in her father; Lung cancer in her mother.     Assessment/Plan:   COPD with emphysema. - continue trelegy - prn albuterol  Allergic rhinitis. - continue nasacort   Time Spent Involved in Patient Care on Day of Examination:  21 minutes  Follow up:  Patient Instructions    Follow up in 1 year   Medication List:   Allergies as of 08/29/2020      Reactions   Sulfonamide Derivatives    REACTION: headache      Medication List       Accurate as of August 29, 2020 10:04 AM. If you have any questions, ask your nurse or doctor.        albuterol 108 (90 Base) MCG/ACT inhaler Commonly known as: Ventolin HFA INHALE TWO PUFFS BY MOUTH EVERY 6 HOURS AS NEEDED   albuterol (2.5 MG/3ML) 0.083% nebulizer solution Commonly known as: PROVENTIL Take 3 mLs (2.5 mg total) by nebulization every 6 (six) hours as needed for wheezing or shortness of breath.   diclofenac 75 MG EC tablet Commonly known as: VOLTAREN TAKE 1 TABLET BY MOUTH TWICE A DAY   simvastatin 40 MG tablet Commonly known as: ZOCOR Take 1 tablet (40 mg total) by mouth daily.   Trelegy Ellipta 100-62.5-25 MCG/INH Aepb Generic drug: Fluticasone-Umeclidin-Vilant Inhale 1 puff into the lungs daily.   triamcinolone 55 MCG/ACT Aero nasal inhaler Commonly known as: Nasacort Allergy 24HR Place 2 sprays into the nose daily.   Vitamin D3 25 MCG (1000 UT) Caps Take 1 capsule by mouth daily.       Signature:  Chesley Mires, MD [place] Pager - [phone] 08/29/2020, 10:04  AM

## 2020-08-29 NOTE — Patient Instructions (Signed)
Follow up in 1 year.

## 2020-10-15 DIAGNOSIS — E785 Hyperlipidemia, unspecified: Secondary | ICD-10-CM | POA: Diagnosis not present

## 2020-10-15 DIAGNOSIS — E559 Vitamin D deficiency, unspecified: Secondary | ICD-10-CM | POA: Diagnosis not present

## 2020-10-17 DIAGNOSIS — D692 Other nonthrombocytopenic purpura: Secondary | ICD-10-CM | POA: Diagnosis not present

## 2020-10-17 DIAGNOSIS — Z86010 Personal history of colonic polyps: Secondary | ICD-10-CM | POA: Diagnosis not present

## 2020-10-17 DIAGNOSIS — E559 Vitamin D deficiency, unspecified: Secondary | ICD-10-CM | POA: Diagnosis not present

## 2020-10-17 DIAGNOSIS — Z87891 Personal history of nicotine dependence: Secondary | ICD-10-CM | POA: Diagnosis not present

## 2020-10-17 DIAGNOSIS — Z0000 Encounter for general adult medical examination without abnormal findings: Secondary | ICD-10-CM | POA: Diagnosis not present

## 2020-10-17 DIAGNOSIS — M810 Age-related osteoporosis without current pathological fracture: Secondary | ICD-10-CM | POA: Diagnosis not present

## 2020-10-17 DIAGNOSIS — R2681 Unsteadiness on feet: Secondary | ICD-10-CM | POA: Diagnosis not present

## 2020-10-17 DIAGNOSIS — I700 Atherosclerosis of aorta: Secondary | ICD-10-CM | POA: Diagnosis not present

## 2020-10-17 DIAGNOSIS — R82998 Other abnormal findings in urine: Secondary | ICD-10-CM | POA: Diagnosis not present

## 2020-10-17 DIAGNOSIS — E785 Hyperlipidemia, unspecified: Secondary | ICD-10-CM | POA: Diagnosis not present

## 2020-10-24 DIAGNOSIS — H5203 Hypermetropia, bilateral: Secondary | ICD-10-CM | POA: Diagnosis not present

## 2020-10-26 DIAGNOSIS — Z7689 Persons encountering health services in other specified circumstances: Secondary | ICD-10-CM | POA: Diagnosis not present

## 2020-12-12 DIAGNOSIS — I700 Atherosclerosis of aorta: Secondary | ICD-10-CM | POA: Diagnosis not present

## 2020-12-12 DIAGNOSIS — J449 Chronic obstructive pulmonary disease, unspecified: Secondary | ICD-10-CM | POA: Diagnosis not present

## 2020-12-12 DIAGNOSIS — Z1339 Encounter for screening examination for other mental health and behavioral disorders: Secondary | ICD-10-CM | POA: Diagnosis not present

## 2020-12-12 DIAGNOSIS — Z1331 Encounter for screening for depression: Secondary | ICD-10-CM | POA: Diagnosis not present

## 2020-12-12 DIAGNOSIS — R69 Illness, unspecified: Secondary | ICD-10-CM | POA: Diagnosis not present

## 2020-12-12 DIAGNOSIS — D692 Other nonthrombocytopenic purpura: Secondary | ICD-10-CM | POA: Diagnosis not present

## 2020-12-12 DIAGNOSIS — Z23 Encounter for immunization: Secondary | ICD-10-CM | POA: Diagnosis not present

## 2020-12-12 DIAGNOSIS — Z87891 Personal history of nicotine dependence: Secondary | ICD-10-CM | POA: Diagnosis not present

## 2020-12-12 DIAGNOSIS — Z0000 Encounter for general adult medical examination without abnormal findings: Secondary | ICD-10-CM | POA: Diagnosis not present

## 2020-12-12 DIAGNOSIS — R2681 Unsteadiness on feet: Secondary | ICD-10-CM | POA: Diagnosis not present

## 2020-12-12 DIAGNOSIS — M810 Age-related osteoporosis without current pathological fracture: Secondary | ICD-10-CM | POA: Diagnosis not present

## 2020-12-12 DIAGNOSIS — E559 Vitamin D deficiency, unspecified: Secondary | ICD-10-CM | POA: Diagnosis not present

## 2021-04-16 DIAGNOSIS — H5203 Hypermetropia, bilateral: Secondary | ICD-10-CM | POA: Diagnosis not present

## 2021-05-17 DIAGNOSIS — R0602 Shortness of breath: Secondary | ICD-10-CM | POA: Diagnosis not present

## 2021-05-17 DIAGNOSIS — Z87891 Personal history of nicotine dependence: Secondary | ICD-10-CM | POA: Diagnosis not present

## 2021-05-17 DIAGNOSIS — J449 Chronic obstructive pulmonary disease, unspecified: Secondary | ICD-10-CM | POA: Diagnosis not present

## 2021-05-17 DIAGNOSIS — G9009 Other idiopathic peripheral autonomic neuropathy: Secondary | ICD-10-CM | POA: Diagnosis not present

## 2021-05-17 DIAGNOSIS — D692 Other nonthrombocytopenic purpura: Secondary | ICD-10-CM | POA: Diagnosis not present

## 2021-05-17 DIAGNOSIS — I700 Atherosclerosis of aorta: Secondary | ICD-10-CM | POA: Diagnosis not present

## 2021-05-17 DIAGNOSIS — R2681 Unsteadiness on feet: Secondary | ICD-10-CM | POA: Diagnosis not present

## 2021-05-17 DIAGNOSIS — F0390 Unspecified dementia without behavioral disturbance: Secondary | ICD-10-CM | POA: Diagnosis not present

## 2021-05-17 DIAGNOSIS — M810 Age-related osteoporosis without current pathological fracture: Secondary | ICD-10-CM | POA: Diagnosis not present

## 2021-05-31 ENCOUNTER — Ambulatory Visit (INDEPENDENT_AMBULATORY_CARE_PROVIDER_SITE_OTHER): Payer: Medicare HMO

## 2021-05-31 ENCOUNTER — Ambulatory Visit: Payer: Medicare HMO | Admitting: Pulmonary Disease

## 2021-05-31 ENCOUNTER — Other Ambulatory Visit: Payer: Self-pay

## 2021-05-31 ENCOUNTER — Encounter: Payer: Self-pay | Admitting: Pulmonary Disease

## 2021-05-31 VITALS — BP 128/62 | HR 67 | Temp 98.6°F | Ht 63.0 in | Wt 162.2 lb

## 2021-05-31 DIAGNOSIS — R0609 Other forms of dyspnea: Secondary | ICD-10-CM

## 2021-05-31 DIAGNOSIS — J432 Centrilobular emphysema: Secondary | ICD-10-CM

## 2021-05-31 DIAGNOSIS — J449 Chronic obstructive pulmonary disease, unspecified: Secondary | ICD-10-CM

## 2021-05-31 DIAGNOSIS — R0600 Dyspnea, unspecified: Secondary | ICD-10-CM | POA: Diagnosis not present

## 2021-05-31 LAB — COMPREHENSIVE METABOLIC PANEL
ALT: 11 U/L (ref 0–35)
AST: 15 U/L (ref 0–37)
Albumin: 4 g/dL (ref 3.5–5.2)
Alkaline Phosphatase: 69 U/L (ref 39–117)
BUN: 17 mg/dL (ref 6–23)
CO2: 28 mEq/L (ref 19–32)
Calcium: 10.5 mg/dL (ref 8.4–10.5)
Chloride: 103 mEq/L (ref 96–112)
Creatinine, Ser: 0.99 mg/dL (ref 0.40–1.20)
GFR: 55.21 mL/min — ABNORMAL LOW (ref >60.00)
Glucose, Bld: 75 mg/dL (ref 70–99)
Potassium: 3.8 mEq/L (ref 3.5–5.1)
Sodium: 139 mEq/L (ref 135–145)
Total Bilirubin: 0.8 mg/dL (ref 0.2–1.2)
Total Protein: 7.4 g/dL (ref 6.0–8.3)

## 2021-05-31 LAB — CBC WITH DIFFERENTIAL/PLATELET
Basophils Absolute: 0.1 10*3/uL (ref 0.0–0.1)
Basophils Relative: 1.2 % (ref 0.0–3.0)
Eosinophils Absolute: 0.8 10*3/uL — ABNORMAL HIGH (ref 0.0–0.7)
Eosinophils Relative: 9.7 % — ABNORMAL HIGH (ref 0.0–5.0)
HCT: 34.3 % — ABNORMAL LOW (ref 36.0–46.0)
Hemoglobin: 11.1 g/dL — ABNORMAL LOW (ref 12.0–15.0)
Lymphocytes Relative: 26.3 % (ref 12.0–46.0)
Lymphs Abs: 2.2 10*3/uL (ref 0.7–4.0)
MCHC: 32.4 g/dL (ref 30.0–36.0)
MCV: 91.5 fl (ref 78.0–100.0)
Monocytes Absolute: 0.9 10*3/uL (ref 0.1–1.0)
Monocytes Relative: 10 % (ref 3.0–12.0)
Neutro Abs: 4.5 10*3/uL (ref 1.4–7.7)
Neutrophils Relative %: 52.8 % (ref 43.0–77.0)
Platelets: 239 10*3/uL (ref 150.0–400.0)
RBC: 3.75 Mil/uL — ABNORMAL LOW (ref 3.87–5.11)
RDW: 14.1 % (ref 11.5–15.5)
WBC: 8.5 10*3/uL (ref 4.0–10.5)

## 2021-05-31 LAB — BRAIN NATRIURETIC PEPTIDE: Pro B Natriuretic peptide (BNP): 91 pg/mL (ref 0.0–100.0)

## 2021-05-31 NOTE — Addendum Note (Signed)
Addended byCoralie Keens on: 05/31/2021 10:54 AM   Modules accepted: Orders

## 2021-05-31 NOTE — Patient Instructions (Signed)
Chest xray and lab tests today  Will schedule Echocardiogram  Follow up in 2 to 3 weeks with Dr. Halford Chessman or Nurse Practitioner

## 2021-05-31 NOTE — Progress Notes (Signed)
[place] [facility], Critical Care, and Sleep Medicine  Chief Complaint  Patient presents with   Follow-up    Having more SOB in the last 3 months.    Constitutional:  BP 128/62   Pulse 67   Temp 98.6 F (37 C) (Oral)   Ht '5\' 3"'$  (1.6 m)   Wt 162 lb 3.2 oz (73.6 kg)   SpO2 95%   BMI 28.73 kg/m   Past Medical History:  HLD, Neuropathy, Diverticulosis, Tinnitus, Diverticulosis, Colon polyps  Past Surgical History:  Her  has a past surgical history that includes Abdominal hysterectomy; Lumbar laminectomy; Nasal sinus surgery; and Shoulder surgery.  Brief Summary:  Susan Collins is a 75 y.o. female former smoker with COPD/emphysema.      Subjective:   She is here with her daughter.  She has trouble hearing and has audiology appointment next week.  She has been getting more short of breath with activity.  This is especially bad in hot weather.  She feels her breathing is very heavy and then she has to sit and rest.  She has been getting swelling in her lower legs.  Not having much cough or sputum.  Not having wheeze, or fever.  She walked in office today.  She was only able to do one lap.  SpO2 low was 91% on room air with heart rate from 85 to 100.  Physical Exam:   Appearance - well kempt   ENMT - no sinus tenderness, no oral exudate, no LAN, Mallampati 2 airway, no stridor  Respiratory - equal breath sounds bilaterally, no wheezing or rales  CV - s1s2 regular rate and rhythm, no murmurs  Ext - 1+ lower leg edema bilateral  Skin - no rashes  Psych - normal mood and affect    Pulmonary testing:  PFT 03/21/15 >> FEV1 1.43 (66%), FEV1% 66, TLC 5.56 (113%), DLCO 52%, + BD  Social History:  She  reports that she quit smoking about 24 years ago. Her smoking use included cigarettes. She started smoking about 61 years ago. She has a 53.00 pack-year smoking history. She has never used smokeless tobacco. She reports that she does not drink alcohol and does not use  drugs.  Family History:  Her family history includes Bladder Cancer in her brother; COPD in her brother and father; Heart attack in her father; Heart disease in her father; Lung cancer in her mother.     Assessment/Plan:   Dyspnea on exertion. - she has progressive symptoms over the past few months - will arrange for CBC with diff, CMET, BNP, Chest xray and Echo  COPD with emphysema and asthma. - I am not certain that her progressive symptoms are related to COPD progression - continue trelegy with prn albuterol - reviewed different roles for her inhalers  Allergic rhinitis. - prn nasacort  Time Spent Involved in Patient Care on Day of Examination:  32 minutes  Follow up:   Patient Instructions  Chest xray and lab tests today  Will schedule Echocardiogram  Follow up in 2 to 3 weeks with Dr. Halford Chessman or Nurse Practitioner  Medication List:   Allergies as of 05/31/2021       Reactions   Sulfonamide Derivatives    REACTION: headache        Medication List        Accurate as of May 31, 2021 10:26 AM. If you have any questions, ask your nurse or doctor.  albuterol 108 (90 Base) MCG/ACT inhaler Commonly known as: Ventolin HFA INHALE TWO PUFFS BY MOUTH EVERY 6 HOURS AS NEEDED   albuterol (2.5 MG/3ML) 0.083% nebulizer solution Commonly known as: PROVENTIL Take 3 mLs (2.5 mg total) by nebulization every 6 (six) hours as needed for wheezing or shortness of breath.   diclofenac 75 MG EC tablet Commonly known as: VOLTAREN TAKE 1 TABLET BY MOUTH TWICE A DAY   simvastatin 40 MG tablet Commonly known as: ZOCOR Take 1 tablet (40 mg total) by mouth daily.   Trelegy Ellipta 100-62.5-25 MCG/INH Aepb Generic drug: Fluticasone-Umeclidin-Vilant Inhale 1 puff into the lungs daily.   triamcinolone 55 MCG/ACT Aero nasal inhaler Commonly known as: Nasacort Allergy 24HR Place 2 sprays into the nose daily.   Vitamin D3 25 MCG (1000 UT) Caps Take 1 capsule by  mouth daily.        Signature:  Susan Mires, MD [place] Pager - [phone] 05/31/2021, 10:26 AM

## 2021-06-04 ENCOUNTER — Other Ambulatory Visit: Payer: Self-pay

## 2021-06-04 DIAGNOSIS — J449 Chronic obstructive pulmonary disease, unspecified: Secondary | ICD-10-CM

## 2021-06-04 DIAGNOSIS — J432 Centrilobular emphysema: Secondary | ICD-10-CM

## 2021-06-04 DIAGNOSIS — J4489 Other specified chronic obstructive pulmonary disease: Secondary | ICD-10-CM

## 2021-06-04 MED ORDER — MONTELUKAST SODIUM 10 MG PO TABS: 10.0000 mg | ORAL_TABLET | Freq: Every day | ORAL | 11 refills | Status: DC

## 2021-06-07 DIAGNOSIS — H906 Mixed conductive and sensorineural hearing loss, bilateral: Secondary | ICD-10-CM | POA: Diagnosis not present

## 2021-06-13 ENCOUNTER — Encounter (HOSPITAL_COMMUNITY): Payer: Self-pay | Admitting: Pulmonary Disease

## 2021-06-14 ENCOUNTER — Ambulatory Visit: Payer: Medicare HMO | Admitting: Primary Care

## 2021-06-24 ENCOUNTER — Ambulatory Visit: Payer: Medicare HMO | Admitting: Adult Health

## 2021-06-24 ENCOUNTER — Other Ambulatory Visit: Payer: Self-pay

## 2021-06-24 ENCOUNTER — Encounter: Payer: Self-pay | Admitting: Adult Health

## 2021-06-24 VITALS — BP 150/60 | HR 85 | Temp 98.5°F | Ht 63.57 in | Wt 164.0 lb

## 2021-06-24 DIAGNOSIS — Z23 Encounter for immunization: Secondary | ICD-10-CM | POA: Diagnosis not present

## 2021-06-24 DIAGNOSIS — J449 Chronic obstructive pulmonary disease, unspecified: Secondary | ICD-10-CM | POA: Diagnosis not present

## 2021-06-24 DIAGNOSIS — J181 Lobar pneumonia, unspecified organism: Secondary | ICD-10-CM

## 2021-06-24 DIAGNOSIS — R0600 Dyspnea, unspecified: Secondary | ICD-10-CM

## 2021-06-24 DIAGNOSIS — R0689 Other abnormalities of breathing: Secondary | ICD-10-CM | POA: Diagnosis not present

## 2021-06-24 HISTORY — DX: Lobar pneumonia, unspecified organism: J18.1

## 2021-06-24 MED ORDER — MONTELUKAST SODIUM 10 MG PO TABS: 10.0000 mg | ORAL_TABLET | Freq: Every day | ORAL | 11 refills | Status: DC

## 2021-06-24 MED ORDER — AZITHROMYCIN 250 MG PO TABS: ORAL_TABLET | ORAL | 0 refills | Status: AC

## 2021-06-24 NOTE — Progress Notes (Signed)
$'@Patient'n$  ID: Susan Collins, female    DOB: 04-20-1946, 75 y.o.   MRN: [id number]  Chief Complaint  Patient presents with   Follow-up    Referring provider: Sueanne Margarita, DO  HPI: 75 year old female former smoker followed for COPD with emphysema  TEST/EVENTS :  PFT 03/21/15 >> FEV1 1.43 (66%), FEV1% 66, TLC 5.56 (113%), DLCO 52%, + BD  06/24/2021 Follow up ; COPD  Patient returns for a 1 month follow-up.  Patient was seen last visit.  She was complaining of increased shortness of breath with activity.  Worse in the hot heat and humidity.  She also was getting some increased lower extremity swelling.  Walk test in the office last visit showed no desaturations. She was recommended to continue on Trelegy inhaler.  She was set up for 2D echo that is pending. Chest x-ray shows some right upper lobe ill-defined opacity and chronic COPD changes.   Labs showed stable anemia.  White blood cell count was normal.  Elevated eosinophils with absolute count at 800 and a normal BNP.  She was started on Singulair 10 mg at bedtime for allergic asthma.  Patient is accompanied by her daughter.  Unfortunately patient did not pass the information along to her daughter.  She also has some mild memory impairment.  Patient does have some ongoing cough and congestion that is intermittent.  We discussed starting an antibiotic for possible early pneumonia with recent abnormal chest x-ray.  Also recommended that she begin the Singulair as her eosinophils were elevated.  Patient does live alone.  Her daughter helps with some household chores.  And shopping.  Patient can do light activities.  Does get short of breath with prolonged walking.  She denies any fever or hemoptysis.  Would like her flu shot today. Husband passed away recently .  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   Peripheral neuropathy    POSTMENOPAUSAL SYNDROME 04/12/2008   TINNITUS, 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    Tobacco History: Social History   Tobacco Use  Smoking Status Former   Packs/day: 1.00   Years: 53.00   Pack years: 53.00   Types: Cigarettes   Start date: 1961   Quit date: 10/13/1996   Years since quitting: 24.7  Smokeless Tobacco Never  Tobacco Comments   smoked 1-1.5 PPD    Counseling given: Not Answered Tobacco comments: smoked 1-1.5 PPD    Outpatient Medications Prior to Visit  Medication Sig Dispense Refill   albuterol (PROVENTIL) (2.5 MG/3ML) 0.083% nebulizer solution Take 3 mLs (2.5 mg total) by nebulization every 6 (six) hours as needed for wheezing or shortness of breath. 75 mL 12   albuterol (VENTOLIN HFA) 108 (90 Base) MCG/ACT inhaler INHALE TWO PUFFS BY MOUTH EVERY 6 HOURS AS NEEDED 18 each 3   Cholecalciferol (VITAMIN D3) 1000 units CAPS Take 1 capsule by mouth daily.      Fluticasone-Umeclidin-Vilant (TRELEGY ELLIPTA)  100-62.5-25 MCG/INH AEPB Inhale 1 puff into the lungs daily. 60 each 5   simvastatin (ZOCOR) 40 MG tablet Take 1 tablet (40 mg total) by mouth daily. 90 tablet 4   triamcinolone (NASACORT ALLERGY 24HR) 55 MCG/ACT AERO nasal inhaler Place 2 sprays into the nose daily. 1 Inhaler 12   diclofenac (VOLTAREN) 75 MG EC tablet TAKE 1 TABLET BY MOUTH TWICE A DAY 180 tablet 1   montelukast (SINGULAIR) 10 MG tablet Take 1 tablet (10 mg total) by mouth at bedtime. (Patient not taking: Reported on  06/24/2021) 30 tablet 11   No facility-administered medications prior to visit.     Review of Systems:   Constitutional:   No  weight loss, night sweats,  Fevers, chills,  +fatigue, or  lassitude.  HEENT:   No headaches,  Difficulty swallowing,  Tooth/dental problems, or  Sore throat,                No sneezing, itching, ear ache, nasal congestion, post nasal drip,   CV:  No chest pain,  Orthopnea, PND, swelling in lower extremities, anasarca, dizziness, palpitations, syncope.   GI  No heartburn, indigestion, abdominal pain, nausea, vomiting, diarrhea, change in bowel habits, loss of appetite, bloody stools.   Resp: .  No chest wall deformity  Skin: no rash or lesions.  GU: no dysuria, change in color of urine, no urgency or frequency.  No flank pain, no hematuria   MS:  No joint pain or swelling.  No decreased range of motion.  No back pain.    Physical Exam  BP (!) 150/60 (BP Location: Left Arm, Patient Position: Sitting, Cuff Size: Normal)   Pulse 85   Temp 98.5 F (36.9 C) (Oral)   Ht 5' 3.57" (1.615 m)   Wt 164 lb (74.4 kg)   SpO2 93%   BMI 28.53 kg/m   GEN: A/Ox3; pleasant , NAD, well nourished    HEENT:  [place]/AT,   NOSE-clear, THROAT-clear, no lesions, no postnasal drip or exudate noted.   NECK:  Supple w/ fair ROM; no JVD; normal carotid impulses w/o bruits; no thyromegaly or nodules palpated; no lymphadenopathy.    RESP  Clear  P & A; w/o, wheezes/ rales/ or rhonchi. no accessory muscle use, no dullness to percussion  CARD:  RRR, no m/r/g, 1-2 + peripheral edema, pulses intact, no cyanosis or clubbing. Stasis dermatitis changes  GI:   Soft & nt; nml bowel sounds; no organomegaly or masses detected.   Musco: Warm bil, no deformities or joint swelling noted.   Neuro: alert, no focal deficits noted.    Skin: Warm, no lesions or rashes    Lab Results:  CBC    Component Value Date/Time   WBC 8.5 05/31/2021 1030   RBC 3.75 (L) 05/31/2021 1030   HGB  11.1 (L) 05/31/2021 1030   HCT 34.3 (L) 05/31/2021 1030   PLT 239.0 05/31/2021 1030   MCV 91.5 05/31/2021 1030   MCHC 32.4 05/31/2021 1030   RDW 14.1 05/31/2021 1030   LYMPHSABS 2.2 05/31/2021 1030   MONOABS 0.9 05/31/2021 1030   EOSABS 0.8 (H) 05/31/2021 1030   BASOSABS 0.1 05/31/2021 1030    BMET    Component Value Date/Time   NA 139 05/31/2021 1030   K 3.8 05/31/2021 1030   CL 103 05/31/2021 1030   CO2 28 05/31/2021 1030   GLUCOSE 75 05/31/2021 1030   BUN 17 05/31/2021 1030   CREATININE 0.99 05/31/2021 1030   CALCIUM 10.5  05/31/2021 1030   GFRNONAA 82.17 06/13/2010 0934   GFRAA 109 04/12/2008 0000    BNP No results found for: BNP  ProBNP    Component Value Date/Time   PROBNP 91.0 05/31/2021 1030    Imaging: DG Chest 2 View  Result Date: 06/01/2021 CLINICAL DATA:  Dyspnea on exertion.  History of COPD. EXAM: CHEST - 2 VIEW COMPARISON:  03/29/2019 FINDINGS: Chronic hyperinflation. Chronic but increased bronchial thickening from prior exam minimal ill-defined opacity in the right upper lung zone. Heart is normal in size. Stable mediastinal contours. Aortic atherosclerosis. There is biapical pleuroparenchymal scarring. No pleural fluid or pneumothorax. No acute osseous abnormalities are seen. IMPRESSION: 1. Chronic hyperinflation and bronchial thickening, imaging findings consistent with COPD. Bronchial thickening has progressed from 2020 exam. 2. Minimal ill-defined opacity in the right upper lung zone, atelectasis versus pneumonia. Recommend follow-up radiographs in 3-4 weeks to assess for persistence or resolution. Electronically Signed   By: Keith Rake M.D.   On: 06/01/2021 16:42      PFT Results Latest Ref Rng & Units 03/21/2015  FVC-Pre L 1.92  FVC-Predicted Pre % 67  FVC-Post L 2.18  FVC-Predicted Post % 76  Pre FEV1/FVC % % 67  Post FEV1/FCV % % 66  FEV1-Pre L 1.29  FEV1-Predicted Pre % 60  FEV1-Post L 1.43  DLCO uncorrected ml/min/mmHg 12.06  DLCO  [facility]% % 52  DLVA Predicted % 93  TLC L 5.56  TLC % Predicted % 113  RV % Predicted % 178    No results found for: NITRICOXIDE      Assessment & Plan:   COPD with asthma (HCC) COPD with emphysema and asthma-recent chest x-ray shows some increased opacities in the right upper lobe suspicious for possible underlying pneumonia.  Patient also had some increased shortness of breath. Will treat for possible underlying pneumonia.  Treat with a Z-Pak.  Check chest x-ray in 3 to 4 weeks. Lab work also showed elevated eosinophils.  Patient is to begin Singulair.  Prescription was sent to the pharmacy.  Patient's daughter is present for today's visit.  Appears patient has some underlying mild memory impairment.  Patient's daughter assures [place] that she will get the prescriptions filled and help patient take accordingly  Plan Patient Instructions  Zpack take as directed.  Continue on Trelegy inhaler 1 puff daily.,  Rinse after use Begin Singulair 10 mg daily Activity as tolerated Albuterol inhaler as needed 2D Echo later this month as planned  Follow up in 4 weeks with Dr. Halford Chessman  or Laureen Frederic NP and As needed   Please contact office for sooner follow up if symptoms do not improve or worsen or seek emergency care        Dyspnea and respiratory abnormality Dyspnea with exertion suspect is multifactorial.  Patient does have lower extremity edema.  2D echo is pending.  Recent BNP was normal. Recommend taking antibiotic Z-Pak for possible underlying pneumonia.  And beginning Singulair for allergic asthma.  Plan  Patient Instructions  Zpack take as directed.  Continue on Trelegy inhaler 1 puff daily.,  Rinse after use Begin Singulair 10 mg daily Activity as tolerated Albuterol inhaler as needed 2D Echo later this month as planned  Follow up in 4 weeks with Dr. Halford Chessman  or Nayleen Janosik NP and As needed   Please contact office for sooner follow up if symptoms do not improve or worsen or seek emergency  care        Lobar pneumonia, unspecified organism ([place]) Suspected  right upper lobe pneumonia.  Recommend Z-Pak take as directed.  Recheck chest x-ray on return visit if not resolved consider CT chest.     Rexene Edison, NP 06/24/2021

## 2021-06-24 NOTE — Assessment & Plan Note (Signed)
Suspected right upper lobe pneumonia.  Recommend Z-Pak take as directed.  Recheck chest x-ray on return visit if not resolved consider CT chest.

## 2021-06-24 NOTE — Assessment & Plan Note (Signed)
COPD with emphysema and asthma-recent chest x-ray shows some increased opacities in the right upper lobe suspicious for possible underlying pneumonia.  Patient also had some increased shortness of breath. Will treat for possible underlying pneumonia.  Treat with a Z-Pak.  Check chest x-ray in 3 to 4 weeks. Lab work also showed elevated eosinophils.  Patient is to begin Singulair.  Prescription was sent to the pharmacy.  Patient's daughter is present for today's visit.  Appears patient has some underlying mild memory impairment.  Patient's daughter assures [place] that she will get the prescriptions filled and help patient take accordingly  Plan Patient Instructions  Zpack take as directed.  Continue on Trelegy inhaler 1 puff daily.,  Rinse after use Begin Singulair 10 mg daily Activity as tolerated Albuterol inhaler as needed 2D Echo later this month as planned  Follow up in 4 weeks with Dr. Halford Chessman  or Ryker Pherigo NP and As needed   Please contact office for sooner follow up if symptoms do not improve or worsen or seek emergency care

## 2021-06-24 NOTE — Patient Instructions (Addendum)
Zpack take as directed.  Continue on Trelegy inhaler 1 puff daily.,  Rinse after use Begin Singulair 10 mg daily Activity as tolerated Albuterol inhaler as needed 2D Echo later this month as planned  Follow up in 4 weeks with Dr. Halford Chessman  or Praneeth Bussey NP and As needed   Please contact office for sooner follow up if symptoms do not improve or worsen or seek emergency care

## 2021-06-24 NOTE — Assessment & Plan Note (Signed)
Dyspnea with exertion suspect is multifactorial.  Patient does have lower extremity edema.  2D echo is pending.  Recent BNP was normal. Recommend taking antibiotic Z-Pak for possible underlying pneumonia.  And beginning Singulair for allergic asthma.  Plan  Patient Instructions  Zpack take as directed.  Continue on Trelegy inhaler 1 puff daily.,  Rinse after use Begin Singulair 10 mg daily Activity as tolerated Albuterol inhaler as needed 2D Echo later this month as planned  Follow up in 4 weeks with Dr. Halford Chessman  or Edell Mesenbrink NP and As needed   Please contact office for sooner follow up if symptoms do not improve or worsen or seek emergency care

## 2021-06-24 NOTE — Addendum Note (Signed)
Addended by: Vanessa Barbara on: 06/24/2021 05:35 PM   Modules accepted: Orders

## 2021-06-25 NOTE — Progress Notes (Signed)
Reviewed and agree with assessment/plan.   Chesley Mires, MD [facility] Pulmonary/Critical Care 06/25/2021, 12:13 PM Pager:  [phone]

## 2021-06-28 DIAGNOSIS — G9009 Other idiopathic peripheral autonomic neuropathy: Secondary | ICD-10-CM | POA: Diagnosis not present

## 2021-06-28 DIAGNOSIS — M810 Age-related osteoporosis without current pathological fracture: Secondary | ICD-10-CM | POA: Diagnosis not present

## 2021-06-28 DIAGNOSIS — H9193 Unspecified hearing loss, bilateral: Secondary | ICD-10-CM | POA: Diagnosis not present

## 2021-06-28 DIAGNOSIS — J449 Chronic obstructive pulmonary disease, unspecified: Secondary | ICD-10-CM | POA: Diagnosis not present

## 2021-06-28 DIAGNOSIS — I252 Old myocardial infarction: Secondary | ICD-10-CM | POA: Diagnosis not present

## 2021-06-28 DIAGNOSIS — F0390 Unspecified dementia without behavioral disturbance: Secondary | ICD-10-CM | POA: Diagnosis not present

## 2021-06-28 DIAGNOSIS — I700 Atherosclerosis of aorta: Secondary | ICD-10-CM | POA: Diagnosis not present

## 2021-06-28 DIAGNOSIS — I10 Essential (primary) hypertension: Secondary | ICD-10-CM | POA: Diagnosis not present

## 2021-06-28 DIAGNOSIS — E785 Hyperlipidemia, unspecified: Secondary | ICD-10-CM | POA: Diagnosis not present

## 2021-07-05 ENCOUNTER — Ambulatory Visit (HOSPITAL_COMMUNITY): Payer: Medicare HMO | Attending: Cardiology

## 2021-07-05 ENCOUNTER — Other Ambulatory Visit: Payer: Self-pay

## 2021-07-05 DIAGNOSIS — R0609 Other forms of dyspnea: Secondary | ICD-10-CM

## 2021-07-05 DIAGNOSIS — R0600 Dyspnea, unspecified: Secondary | ICD-10-CM | POA: Diagnosis not present

## 2021-07-05 LAB — ECHOCARDIOGRAM COMPLETE
Area-P 1/2: 3.46 cm2
S' Lateral: 3 cm

## 2021-07-06 ENCOUNTER — Other Ambulatory Visit: Payer: Self-pay | Admitting: Primary Care

## 2021-07-11 DIAGNOSIS — I252 Old myocardial infarction: Secondary | ICD-10-CM | POA: Diagnosis not present

## 2021-07-11 DIAGNOSIS — I700 Atherosclerosis of aorta: Secondary | ICD-10-CM | POA: Diagnosis not present

## 2021-07-11 DIAGNOSIS — F0390 Unspecified dementia without behavioral disturbance: Secondary | ICD-10-CM | POA: Diagnosis not present

## 2021-07-11 DIAGNOSIS — H9193 Unspecified hearing loss, bilateral: Secondary | ICD-10-CM | POA: Diagnosis not present

## 2021-07-11 DIAGNOSIS — M810 Age-related osteoporosis without current pathological fracture: Secondary | ICD-10-CM | POA: Diagnosis not present

## 2021-07-11 DIAGNOSIS — G9009 Other idiopathic peripheral autonomic neuropathy: Secondary | ICD-10-CM | POA: Diagnosis not present

## 2021-07-11 DIAGNOSIS — E785 Hyperlipidemia, unspecified: Secondary | ICD-10-CM | POA: Diagnosis not present

## 2021-07-11 DIAGNOSIS — I10 Essential (primary) hypertension: Secondary | ICD-10-CM | POA: Diagnosis not present

## 2021-07-11 DIAGNOSIS — J449 Chronic obstructive pulmonary disease, unspecified: Secondary | ICD-10-CM | POA: Diagnosis not present

## 2021-07-12 DIAGNOSIS — H9193 Unspecified hearing loss, bilateral: Secondary | ICD-10-CM | POA: Diagnosis not present

## 2021-07-12 DIAGNOSIS — I252 Old myocardial infarction: Secondary | ICD-10-CM | POA: Diagnosis not present

## 2021-07-12 DIAGNOSIS — I700 Atherosclerosis of aorta: Secondary | ICD-10-CM | POA: Diagnosis not present

## 2021-07-12 DIAGNOSIS — J449 Chronic obstructive pulmonary disease, unspecified: Secondary | ICD-10-CM | POA: Diagnosis not present

## 2021-07-12 DIAGNOSIS — F0390 Unspecified dementia without behavioral disturbance: Secondary | ICD-10-CM | POA: Diagnosis not present

## 2021-07-12 DIAGNOSIS — G9009 Other idiopathic peripheral autonomic neuropathy: Secondary | ICD-10-CM | POA: Diagnosis not present

## 2021-07-12 DIAGNOSIS — E785 Hyperlipidemia, unspecified: Secondary | ICD-10-CM | POA: Diagnosis not present

## 2021-07-12 DIAGNOSIS — I10 Essential (primary) hypertension: Secondary | ICD-10-CM | POA: Diagnosis not present

## 2021-07-12 DIAGNOSIS — M810 Age-related osteoporosis without current pathological fracture: Secondary | ICD-10-CM | POA: Diagnosis not present

## 2021-07-18 DIAGNOSIS — J449 Chronic obstructive pulmonary disease, unspecified: Secondary | ICD-10-CM | POA: Diagnosis not present

## 2021-07-18 DIAGNOSIS — I700 Atherosclerosis of aorta: Secondary | ICD-10-CM | POA: Diagnosis not present

## 2021-07-18 DIAGNOSIS — M810 Age-related osteoporosis without current pathological fracture: Secondary | ICD-10-CM | POA: Diagnosis not present

## 2021-07-18 DIAGNOSIS — I252 Old myocardial infarction: Secondary | ICD-10-CM | POA: Diagnosis not present

## 2021-07-18 DIAGNOSIS — H9193 Unspecified hearing loss, bilateral: Secondary | ICD-10-CM | POA: Diagnosis not present

## 2021-07-18 DIAGNOSIS — G9009 Other idiopathic peripheral autonomic neuropathy: Secondary | ICD-10-CM | POA: Diagnosis not present

## 2021-07-18 DIAGNOSIS — E785 Hyperlipidemia, unspecified: Secondary | ICD-10-CM | POA: Diagnosis not present

## 2021-07-18 DIAGNOSIS — I10 Essential (primary) hypertension: Secondary | ICD-10-CM | POA: Diagnosis not present

## 2021-07-18 DIAGNOSIS — F0390 Unspecified dementia without behavioral disturbance: Secondary | ICD-10-CM | POA: Diagnosis not present

## 2021-07-26 DIAGNOSIS — F0390 Unspecified dementia without behavioral disturbance: Secondary | ICD-10-CM | POA: Diagnosis not present

## 2021-07-26 DIAGNOSIS — I700 Atherosclerosis of aorta: Secondary | ICD-10-CM | POA: Diagnosis not present

## 2021-07-26 DIAGNOSIS — J449 Chronic obstructive pulmonary disease, unspecified: Secondary | ICD-10-CM | POA: Diagnosis not present

## 2021-07-26 DIAGNOSIS — H9193 Unspecified hearing loss, bilateral: Secondary | ICD-10-CM | POA: Diagnosis not present

## 2021-07-26 DIAGNOSIS — I252 Old myocardial infarction: Secondary | ICD-10-CM | POA: Diagnosis not present

## 2021-07-26 DIAGNOSIS — I10 Essential (primary) hypertension: Secondary | ICD-10-CM | POA: Diagnosis not present

## 2021-07-26 DIAGNOSIS — E785 Hyperlipidemia, unspecified: Secondary | ICD-10-CM | POA: Diagnosis not present

## 2021-07-26 DIAGNOSIS — M810 Age-related osteoporosis without current pathological fracture: Secondary | ICD-10-CM | POA: Diagnosis not present

## 2021-07-26 DIAGNOSIS — G9009 Other idiopathic peripheral autonomic neuropathy: Secondary | ICD-10-CM | POA: Diagnosis not present

## 2021-07-28 DIAGNOSIS — I700 Atherosclerosis of aorta: Secondary | ICD-10-CM | POA: Diagnosis not present

## 2021-07-28 DIAGNOSIS — J449 Chronic obstructive pulmonary disease, unspecified: Secondary | ICD-10-CM | POA: Diagnosis not present

## 2021-07-28 DIAGNOSIS — I252 Old myocardial infarction: Secondary | ICD-10-CM | POA: Diagnosis not present

## 2021-07-28 DIAGNOSIS — G9009 Other idiopathic peripheral autonomic neuropathy: Secondary | ICD-10-CM | POA: Diagnosis not present

## 2021-07-28 DIAGNOSIS — F0390 Unspecified dementia without behavioral disturbance: Secondary | ICD-10-CM | POA: Diagnosis not present

## 2021-07-28 DIAGNOSIS — E785 Hyperlipidemia, unspecified: Secondary | ICD-10-CM | POA: Diagnosis not present

## 2021-07-28 DIAGNOSIS — M810 Age-related osteoporosis without current pathological fracture: Secondary | ICD-10-CM | POA: Diagnosis not present

## 2021-07-28 DIAGNOSIS — I10 Essential (primary) hypertension: Secondary | ICD-10-CM | POA: Diagnosis not present

## 2021-07-28 DIAGNOSIS — H9193 Unspecified hearing loss, bilateral: Secondary | ICD-10-CM | POA: Diagnosis not present

## 2021-07-31 DIAGNOSIS — F0390 Unspecified dementia without behavioral disturbance: Secondary | ICD-10-CM | POA: Diagnosis not present

## 2021-07-31 DIAGNOSIS — I10 Essential (primary) hypertension: Secondary | ICD-10-CM | POA: Diagnosis not present

## 2021-07-31 DIAGNOSIS — E785 Hyperlipidemia, unspecified: Secondary | ICD-10-CM | POA: Diagnosis not present

## 2021-07-31 DIAGNOSIS — J449 Chronic obstructive pulmonary disease, unspecified: Secondary | ICD-10-CM | POA: Diagnosis not present

## 2021-07-31 DIAGNOSIS — I252 Old myocardial infarction: Secondary | ICD-10-CM | POA: Diagnosis not present

## 2021-07-31 DIAGNOSIS — M810 Age-related osteoporosis without current pathological fracture: Secondary | ICD-10-CM | POA: Diagnosis not present

## 2021-07-31 DIAGNOSIS — G9009 Other idiopathic peripheral autonomic neuropathy: Secondary | ICD-10-CM | POA: Diagnosis not present

## 2021-07-31 DIAGNOSIS — H9193 Unspecified hearing loss, bilateral: Secondary | ICD-10-CM | POA: Diagnosis not present

## 2021-07-31 DIAGNOSIS — I700 Atherosclerosis of aorta: Secondary | ICD-10-CM | POA: Diagnosis not present

## 2021-08-02 ENCOUNTER — Other Ambulatory Visit: Payer: Self-pay

## 2021-08-02 ENCOUNTER — Ambulatory Visit (INDEPENDENT_AMBULATORY_CARE_PROVIDER_SITE_OTHER): Payer: Medicare HMO

## 2021-08-02 ENCOUNTER — Ambulatory Visit: Payer: Medicare HMO | Admitting: Pulmonary Disease

## 2021-08-02 ENCOUNTER — Encounter: Payer: Self-pay | Admitting: Pulmonary Disease

## 2021-08-02 VITALS — BP 130/60 | HR 69 | Temp 98.2°F | Ht 63.0 in | Wt 157.0 lb

## 2021-08-02 DIAGNOSIS — J432 Centrilobular emphysema: Secondary | ICD-10-CM

## 2021-08-02 DIAGNOSIS — J449 Chronic obstructive pulmonary disease, unspecified: Secondary | ICD-10-CM

## 2021-08-02 DIAGNOSIS — J439 Emphysema, unspecified: Secondary | ICD-10-CM | POA: Diagnosis not present

## 2021-08-02 DIAGNOSIS — R059 Cough, unspecified: Secondary | ICD-10-CM | POA: Diagnosis not present

## 2021-08-02 MED ORDER — TRELEGY ELLIPTA 100-62.5-25 MCG/ACT IN AEPB: 1.0000 | INHALATION_SPRAY | Freq: Every day | RESPIRATORY_TRACT | 5 refills | Status: DC

## 2021-08-02 NOTE — Patient Instructions (Signed)
Follow up in 6 months 

## 2021-08-02 NOTE — Progress Notes (Signed)
[facility]  Chief Complaint  Patient presents with   Follow-up    Needs refill on trelegy    Constitutional:  BP 130/60   Pulse 69   Temp 98.2 F (36.8 C) (Oral)   Ht 5\' 3"  (1.6 m)   Wt 157 lb (71.2 kg)   SpO2 96%   BMI 27.81 kg/m   Past Medical History:  HLD, Neuropathy, Diverticulosis, Tinnitus, Diverticulosis, Colon polyps  Past Surgical History:  Her  has a past surgical history that includes Abdominal hysterectomy; Lumbar laminectomy; Nasal sinus surgery; and Shoulder surgery.  Brief Summary:  Susan Collins is a 75 y.o. female former smoker with COPD/emphysema.      Subjective:   She is here with her daughter.  Chest xray from 06/01/21 opacity in Rt upper lung zone, hyperinflation, and bronchial thickening.  She saw Tammy Parrett in September and treated with Zpak and started on singulair.  Echocardiogram was unremarkable for cause of her dyspnea.  Chest xray report today mention infiltrate in rt lower lobe.  Clinically no evidence for pneumonia and no symptoms of pneumonia.  She feels better.  Occasionally has cough with clear sputum.  Not having fever, chest pain, or wheeze.  Trelegy and singulair have helped.  Physical Exam:   Appearance - well kempt   ENMT - no sinus tenderness, no oral exudate, no LAN, Mallampati 3 airway, no stridor  Respiratory - equal breath sounds bilaterally, no wheezing or rales  CV - s1s2 regular rate and rhythm, no murmurs  Ext - no clubbing, no edema  Skin - no rashes  Psych - normal mood and affect     Pulmonary testing:  PFT 03/21/15 >> FEV1 1.43 (66%), FEV1% 66, TLC 5.56 (113%), DLCO 52%, + BD  Cardiac testing:  Echo 07/05/21 >> EF 60 to 65%, grade 1 DD  Social History:  She  reports that she quit smoking about 24 years ago. Her smoking use included cigarettes. She started smoking about 61 years ago. She has a 53.00 pack-year smoking history. She has never used  smokeless tobacco. She reports that she does not drink alcohol and does not use drugs.  Family History:  Her family history includes Bladder Cancer in her brother; COPD in her brother and father; Heart attack in her father; Heart disease in her father; Lung cancer in her mother.     Assessment/Plan:   COPD with emphysema and asthma. - continue trelegy and singulair - prn albuterol  Time Spent Involved in Patient Care on Day of Examination:  23 minutes  Follow up:   Patient Instructions  Follow up in 6 months  Medication List:   Allergies as of 08/02/2021       Reactions   Sulfonamide Derivatives    REACTION: headache        Medication List        Accurate as of August 02, 2021  5:17 PM. If you have any questions, ask your nurse or doctor.          STOP taking these medications    diclofenac 75 MG EC tablet Commonly known as: VOLTAREN Stopped by: Chesley Mires, MD   triamcinolone 55 MCG/ACT Aero nasal inhaler Commonly known as: Nasacort Allergy 24HR Stopped by: Chesley Mires, MD       TAKE these medications    albuterol 108 (90 Base) MCG/ACT inhaler Commonly known as: Ventolin HFA INHALE TWO PUFFS BY MOUTH EVERY 6 HOURS AS NEEDED  albuterol (2.5 MG/3ML) 0.083% nebulizer solution Commonly known as: PROVENTIL Take 3 mLs (2.5 mg total) by nebulization every 6 (six) hours as needed for wheezing or shortness of breath.   montelukast 10 MG tablet Commonly known as: SINGULAIR Take 1 tablet (10 mg total) by mouth at bedtime.   simvastatin 40 MG tablet Commonly known as: ZOCOR Take 1 tablet (40 mg total) by mouth daily.   Trelegy Ellipta 100-62.5-25 MCG/ACT Aepb Generic drug: Fluticasone-Umeclidin-Vilant Inhale 1 puff into the lungs daily. What changed:  medication strength See the new instructions. Changed by: Chesley Mires, MD   Vitamin D3 25 MCG (1000 UT) Caps Take 1 capsule by mouth daily.        Signature:  Chesley Mires, MD [place] Pager - [phone] 08/02/2021, 5:17 PM

## 2021-08-09 DIAGNOSIS — G9009 Other idiopathic peripheral autonomic neuropathy: Secondary | ICD-10-CM | POA: Diagnosis not present

## 2021-08-09 DIAGNOSIS — F0390 Unspecified dementia without behavioral disturbance: Secondary | ICD-10-CM | POA: Diagnosis not present

## 2021-08-09 DIAGNOSIS — I252 Old myocardial infarction: Secondary | ICD-10-CM | POA: Diagnosis not present

## 2021-08-09 DIAGNOSIS — E785 Hyperlipidemia, unspecified: Secondary | ICD-10-CM | POA: Diagnosis not present

## 2021-08-09 DIAGNOSIS — H9193 Unspecified hearing loss, bilateral: Secondary | ICD-10-CM | POA: Diagnosis not present

## 2021-08-09 DIAGNOSIS — I700 Atherosclerosis of aorta: Secondary | ICD-10-CM | POA: Diagnosis not present

## 2021-08-09 DIAGNOSIS — I10 Essential (primary) hypertension: Secondary | ICD-10-CM | POA: Diagnosis not present

## 2021-08-09 DIAGNOSIS — M810 Age-related osteoporosis without current pathological fracture: Secondary | ICD-10-CM | POA: Diagnosis not present

## 2021-08-09 DIAGNOSIS — J449 Chronic obstructive pulmonary disease, unspecified: Secondary | ICD-10-CM | POA: Diagnosis not present

## 2021-08-15 DIAGNOSIS — J449 Chronic obstructive pulmonary disease, unspecified: Secondary | ICD-10-CM | POA: Diagnosis not present

## 2021-08-15 DIAGNOSIS — I252 Old myocardial infarction: Secondary | ICD-10-CM | POA: Diagnosis not present

## 2021-08-15 DIAGNOSIS — F0390 Unspecified dementia without behavioral disturbance: Secondary | ICD-10-CM | POA: Diagnosis not present

## 2021-08-15 DIAGNOSIS — M810 Age-related osteoporosis without current pathological fracture: Secondary | ICD-10-CM | POA: Diagnosis not present

## 2021-08-15 DIAGNOSIS — E785 Hyperlipidemia, unspecified: Secondary | ICD-10-CM | POA: Diagnosis not present

## 2021-08-15 DIAGNOSIS — H9193 Unspecified hearing loss, bilateral: Secondary | ICD-10-CM | POA: Diagnosis not present

## 2021-08-15 DIAGNOSIS — I10 Essential (primary) hypertension: Secondary | ICD-10-CM | POA: Diagnosis not present

## 2021-08-15 DIAGNOSIS — I700 Atherosclerosis of aorta: Secondary | ICD-10-CM | POA: Diagnosis not present

## 2021-08-15 DIAGNOSIS — G9009 Other idiopathic peripheral autonomic neuropathy: Secondary | ICD-10-CM | POA: Diagnosis not present

## 2021-08-16 DIAGNOSIS — R2681 Unsteadiness on feet: Secondary | ICD-10-CM | POA: Diagnosis not present

## 2021-08-16 DIAGNOSIS — M810 Age-related osteoporosis without current pathological fracture: Secondary | ICD-10-CM | POA: Diagnosis not present

## 2021-08-16 DIAGNOSIS — J449 Chronic obstructive pulmonary disease, unspecified: Secondary | ICD-10-CM | POA: Diagnosis not present

## 2021-08-16 DIAGNOSIS — C4490 Unspecified malignant neoplasm of skin, unspecified: Secondary | ICD-10-CM | POA: Diagnosis not present

## 2021-08-16 DIAGNOSIS — I700 Atherosclerosis of aorta: Secondary | ICD-10-CM | POA: Diagnosis not present

## 2021-08-16 DIAGNOSIS — G9009 Other idiopathic peripheral autonomic neuropathy: Secondary | ICD-10-CM | POA: Diagnosis not present

## 2021-08-16 DIAGNOSIS — L609 Nail disorder, unspecified: Secondary | ICD-10-CM | POA: Diagnosis not present

## 2021-08-16 DIAGNOSIS — F0390 Unspecified dementia without behavioral disturbance: Secondary | ICD-10-CM | POA: Diagnosis not present

## 2021-08-16 DIAGNOSIS — D692 Other nonthrombocytopenic purpura: Secondary | ICD-10-CM | POA: Diagnosis not present

## 2021-09-10 ENCOUNTER — Telehealth: Payer: Self-pay | Admitting: Internal Medicine

## 2021-09-10 NOTE — Telephone Encounter (Signed)
Contacted patient and she advised me to contact her daughter due to her handling everything regarding to her appointments.   Left voicemail to see if patient was still a patient of Dr.Hernandez. If patient is still a patient of Dr.Hernandez, she would need to be seen.

## 2021-09-19 ENCOUNTER — Ambulatory Visit: Payer: Medicare HMO | Admitting: Podiatry

## 2021-09-19 ENCOUNTER — Other Ambulatory Visit: Payer: Self-pay

## 2021-09-19 ENCOUNTER — Encounter: Payer: Self-pay | Admitting: Podiatry

## 2021-09-19 DIAGNOSIS — M79675 Pain in left toe(s): Secondary | ICD-10-CM

## 2021-09-19 DIAGNOSIS — M79674 Pain in right toe(s): Secondary | ICD-10-CM | POA: Diagnosis not present

## 2021-09-19 DIAGNOSIS — I700 Atherosclerosis of aorta: Secondary | ICD-10-CM | POA: Insufficient documentation

## 2021-09-19 DIAGNOSIS — L609 Nail disorder, unspecified: Secondary | ICD-10-CM | POA: Insufficient documentation

## 2021-09-19 DIAGNOSIS — B351 Tinea unguium: Secondary | ICD-10-CM | POA: Diagnosis not present

## 2021-09-19 DIAGNOSIS — M810 Age-related osteoporosis without current pathological fracture: Secondary | ICD-10-CM | POA: Insufficient documentation

## 2021-09-19 DIAGNOSIS — F0390 Unspecified dementia without behavioral disturbance: Secondary | ICD-10-CM | POA: Insufficient documentation

## 2021-09-19 DIAGNOSIS — C4490 Unspecified malignant neoplasm of skin, unspecified: Secondary | ICD-10-CM | POA: Insufficient documentation

## 2021-09-19 DIAGNOSIS — H9190 Unspecified hearing loss, unspecified ear: Secondary | ICD-10-CM | POA: Insufficient documentation

## 2021-09-19 DIAGNOSIS — D692 Other nonthrombocytopenic purpura: Secondary | ICD-10-CM | POA: Insufficient documentation

## 2021-09-19 NOTE — Progress Notes (Signed)
  Subjective:  [name], female    DOB: July 10, 1946,  MRN: [id number]  Chief Complaint  Patient presents with   Nail Problem    (New Patient) Diagnosis: nail abnormalities  Referring Provider: Sueanne Margarita     75 y.o. female presents with the above complaint. History confirmed with patient.  Nails are thickened elongated and causing pain and deformed, she is unable to cut them   Objective:  Physical Exam: warm, good capillary refill, no trophic changes or ulcerative lesions, normal DP and PT pulses, and normal sensory exam. Left Foot: dystrophic yellowed discolored nail plates with subungual debris Right Foot: dystrophic yellowed discolored nail plates with subungual debris  Assessment:   1. Pain due to onychomycosis of toenails of both feet      Plan:  Patient was evaluated and treated and all questions answered.  Discussed the etiology and treatment options for the condition in detail with the patient. Educated patient on the topical and oral treatment options for mycotic nails. Recommended debridement of the nails today. Sharp and mechanical debridement performed of all painful and mycotic nails today. Nails debrided in length and thickness using a nail nipper to level of comfort. Discussed treatment options including appropriate shoe gear. Follow up as needed for painful nails.    Return in about 3 months (around 12/18/2021) for Painful thickened nails.

## 2021-09-23 ENCOUNTER — Other Ambulatory Visit: Payer: Self-pay | Admitting: Pulmonary Disease

## 2021-10-18 DIAGNOSIS — H35033 Hypertensive retinopathy, bilateral: Secondary | ICD-10-CM | POA: Diagnosis not present

## 2021-10-18 DIAGNOSIS — H2513 Age-related nuclear cataract, bilateral: Secondary | ICD-10-CM | POA: Diagnosis not present

## 2021-10-18 DIAGNOSIS — H25042 Posterior subcapsular polar age-related cataract, left eye: Secondary | ICD-10-CM | POA: Diagnosis not present

## 2021-12-06 ENCOUNTER — Telehealth: Payer: Self-pay | Admitting: Pulmonary Disease

## 2021-12-06 NOTE — Telephone Encounter (Signed)
Called patient's daughter Lynelle Smoke but she did not answer. Left message for her to call back on Monday.

## 2021-12-09 MED ORDER — ALBUTEROL SULFATE (2.5 MG/3ML) 0.083% IN NEBU: INHALATION_SOLUTION | RESPIRATORY_TRACT | 10 refills | Status: DC

## 2021-12-09 NOTE — Telephone Encounter (Signed)
Called and spoke with patient's daughter. She stated that [facility] is out of stock of the albuterol nebulizer solution and they are not sure of when they will get more in stock. She wanted to see if there was an alternative. She could not remember where her mother got her nebulizer machine so I could not send it to a DME.   She wanted to use either [facility] on [address] or [facility] on [address].   I called [facility] on [address] and they stated that they did have the albuterol in stock.   I called the daughter back to let her know. She wishes to have this RX sent to [facility] RX has been sent.   Nothing further needed at time of call.

## 2021-12-19 ENCOUNTER — Ambulatory Visit (INDEPENDENT_AMBULATORY_CARE_PROVIDER_SITE_OTHER): Payer: Medicare HMO | Admitting: Podiatry

## 2021-12-19 ENCOUNTER — Other Ambulatory Visit: Payer: Self-pay

## 2021-12-19 DIAGNOSIS — M79674 Pain in right toe(s): Secondary | ICD-10-CM

## 2021-12-19 DIAGNOSIS — M79675 Pain in left toe(s): Secondary | ICD-10-CM

## 2021-12-19 DIAGNOSIS — B351 Tinea unguium: Secondary | ICD-10-CM | POA: Diagnosis not present

## 2021-12-20 DIAGNOSIS — E785 Hyperlipidemia, unspecified: Secondary | ICD-10-CM | POA: Diagnosis not present

## 2021-12-20 DIAGNOSIS — D649 Anemia, unspecified: Secondary | ICD-10-CM | POA: Diagnosis not present

## 2021-12-20 DIAGNOSIS — E559 Vitamin D deficiency, unspecified: Secondary | ICD-10-CM | POA: Diagnosis not present

## 2021-12-20 DIAGNOSIS — I700 Atherosclerosis of aorta: Secondary | ICD-10-CM | POA: Diagnosis not present

## 2021-12-23 ENCOUNTER — Telehealth: Payer: Self-pay | Admitting: Podiatry

## 2021-12-23 ENCOUNTER — Encounter: Payer: Self-pay | Admitting: Podiatry

## 2021-12-23 NOTE — Telephone Encounter (Signed)
Patient daughter called -  medication ( a cream for her feet ) was to be called into [facility] at [address] , but they never received it, Please advise -  call after 430 pm or leave  voicemail .

## 2021-12-23 NOTE — Progress Notes (Signed)
?  Subjective:  ?[name], female    DOB: 03-14-1946,  MRN: [id number] ? ?Chief Complaint  ?Patient presents with  ? Routine foot care  ?  Nail trim 1-5 bilat  ?Diabetic foot exam   ? ? ?76 y.o. female presents with the above complaint. History confirmed with patient.  Nails are thickened elongated and causing pain and deformed, she is unable to cut them.  Here with her daughter.  Previous debridement has been helpful. ? ?Objective:  ?Physical Exam: ?warm, good capillary refill, no trophic changes or ulcerative lesions, normal DP and PT pulses, and normal sensory exam. ?Left Foot: dystrophic yellowed discolored nail plates with subungual debris ?Right Foot: dystrophic yellowed discolored nail plates with subungual debris ? ?Assessment:  ? ?1. Pain due to onychomycosis of toenails of both feet   ? ? ? ?Plan:  ?Patient was evaluated and treated and all questions answered. ? ?Discussed the etiology and treatment options for the condition in detail with the patient. Educated patient on the topical and oral treatment options for mycotic nails. Recommended debridement of the nails today. Sharp and mechanical debridement performed of all painful and mycotic nails today. Nails debrided in length and thickness using a nail nipper to level of comfort. Discussed treatment options including appropriate shoe gear. Follow up as needed for painful nails. ? ? ? ?Return in about 3 months (around 03/21/2022) for at risk foot care.  ? ? ?

## 2021-12-23 NOTE — Telephone Encounter (Signed)
Please advise 

## 2021-12-26 MED ORDER — AMMONIUM LACTATE 12 % EX LOTN: 1.0000 "application " | TOPICAL_LOTION | CUTANEOUS | 0 refills | Status: DC | PRN

## 2021-12-26 NOTE — Telephone Encounter (Signed)
Called and spoke with daughter and she said that it was a cream for dry skin that was supposed to be sent in.Please send to [place], [address]. ?

## 2021-12-27 DIAGNOSIS — J449 Chronic obstructive pulmonary disease, unspecified: Secondary | ICD-10-CM | POA: Diagnosis not present

## 2021-12-27 DIAGNOSIS — R2681 Unsteadiness on feet: Secondary | ICD-10-CM | POA: Diagnosis not present

## 2021-12-27 DIAGNOSIS — M810 Age-related osteoporosis without current pathological fracture: Secondary | ICD-10-CM | POA: Diagnosis not present

## 2021-12-27 DIAGNOSIS — I700 Atherosclerosis of aorta: Secondary | ICD-10-CM | POA: Diagnosis not present

## 2021-12-27 DIAGNOSIS — G9009 Other idiopathic peripheral autonomic neuropathy: Secondary | ICD-10-CM | POA: Diagnosis not present

## 2021-12-27 DIAGNOSIS — F0390 Unspecified dementia without behavioral disturbance: Secondary | ICD-10-CM | POA: Diagnosis not present

## 2021-12-27 DIAGNOSIS — D692 Other nonthrombocytopenic purpura: Secondary | ICD-10-CM | POA: Diagnosis not present

## 2021-12-27 DIAGNOSIS — E785 Hyperlipidemia, unspecified: Secondary | ICD-10-CM | POA: Diagnosis not present

## 2021-12-27 DIAGNOSIS — R82998 Other abnormal findings in urine: Secondary | ICD-10-CM | POA: Diagnosis not present

## 2021-12-27 DIAGNOSIS — Z0000 Encounter for general adult medical examination without abnormal findings: Secondary | ICD-10-CM | POA: Diagnosis not present

## 2022-01-23 DIAGNOSIS — H6523 Chronic serous otitis media, bilateral: Secondary | ICD-10-CM | POA: Diagnosis not present

## 2022-01-23 DIAGNOSIS — J449 Chronic obstructive pulmonary disease, unspecified: Secondary | ICD-10-CM | POA: Diagnosis not present

## 2022-01-23 DIAGNOSIS — R42 Dizziness and giddiness: Secondary | ICD-10-CM | POA: Diagnosis not present

## 2022-01-29 ENCOUNTER — Encounter: Payer: Self-pay | Admitting: Pulmonary Disease

## 2022-01-29 ENCOUNTER — Ambulatory Visit (INDEPENDENT_AMBULATORY_CARE_PROVIDER_SITE_OTHER): Payer: Medicare HMO

## 2022-01-29 ENCOUNTER — Ambulatory Visit: Payer: Medicare HMO | Admitting: Pulmonary Disease

## 2022-01-29 VITALS — BP 118/64 | HR 78 | Temp 98.4°F | Ht 63.0 in | Wt 161.8 lb

## 2022-01-29 DIAGNOSIS — R600 Localized edema: Secondary | ICD-10-CM

## 2022-01-29 DIAGNOSIS — R0609 Other forms of dyspnea: Secondary | ICD-10-CM

## 2022-01-29 DIAGNOSIS — J432 Centrilobular emphysema: Secondary | ICD-10-CM | POA: Diagnosis not present

## 2022-01-29 DIAGNOSIS — R918 Other nonspecific abnormal finding of lung field: Secondary | ICD-10-CM | POA: Diagnosis not present

## 2022-01-29 DIAGNOSIS — J449 Chronic obstructive pulmonary disease, unspecified: Secondary | ICD-10-CM

## 2022-01-29 DIAGNOSIS — R0600 Dyspnea, unspecified: Secondary | ICD-10-CM | POA: Diagnosis not present

## 2022-01-29 DIAGNOSIS — I700 Atherosclerosis of aorta: Secondary | ICD-10-CM | POA: Diagnosis not present

## 2022-01-29 NOTE — Progress Notes (Signed)
? ?[facility] ? ?Chief Complaint  ?Patient presents with  ? Follow-up  ?  SOB   ? ? ?Constitutional:  ?BP 118/64 (BP Location: Left Arm, Patient Position: Sitting, Cuff Size: Normal)   Pulse 78   Temp 98.4 ?F (36.9 ?C) (Oral)   Ht '5\' 3"'$  (1.6 m)   Wt 161 lb 12.8 oz (73.4 kg)   SpO2 94%   BMI 28.66 kg/m?  ? ?Past Medical History:  ?HLD, Neuropathy, Diverticulosis, Tinnitus, Diverticulosis, Colon polyps ? ?Past Surgical History:  ?Her  has a past surgical history that includes Abdominal hysterectomy; Lumbar laminectomy; Nasal sinus surgery; and Shoulder surgery. ? ?Brief Summary:  ?Susan Collins is a 76 y.o. female former smoker with COPD/emphysema. ?  ? ? ? ?Subjective:  ? ?She is here with her daughter. ? ?She has been getting more short of breath with activity.  Not having cough, wheeze, or sputum.  Has more leg swelling and more difficult to fit her shoes on.  Not having fever, hemoptysis, or chest pain.  Uses albuterol daily and this helps. ? ?Physical Exam:  ? ?Appearance - well kempt  ? ?ENMT - no sinus tenderness, no oral exudate, no LAN, Mallampati 3 airway, no stridor ? ?Respiratory - decreased breath sounds bilaterally, no wheezing or rales ? ?CV - s1s2 regular rate and rhythm, no murmurs ? ?Ext - 1+ lower leg edema bilateral ? ?Skin - no rashes ? ?Psych - normal mood and affect ? ? ? ?  ?Pulmonary testing:  ?PFT 03/21/15 >> FEV1 1.43 (66%), FEV1% 66, TLC 5.56 (113%), DLCO 52%, + BD ? ?Cardiac testing:  ?Echo 07/05/21 >> EF 60 to 65%, grade 1 DD ? ?Social History:  ?She  reports that she quit smoking about 25 years ago. Her smoking use included cigarettes. She started smoking about 62 years ago. She has a 53.00 pack-year smoking history. She has never used smokeless tobacco. She reports that she does not drink alcohol and does not use drugs. ? ?Family History:  ?Her family history includes Bladder Cancer in her brother; COPD in her brother and father; Heart  attack in her father; Heart disease in her father; Lung cancer in her mother. ?  ? ? ?Assessment/Plan:  ? ?COPD with emphysema and asthma. ?- continue trelegy and singulair ?- prn albuterol ?- reviewed the different roles for her inhalers and when to use them ? ?Progressive dyspnea on exertion and lower leg edema. ?- will check CMET, CBC with diff, BNP, and chest xray ?- depending on results will then likely start her on lasix 20 mg daily until next follow up appointment ?- advised her to monitor her weight on a daily basis ? ?Time Spent Involved in Patient Care on Day of Examination:  ?35 minutes ? ?Follow up:  ? ?Patient Instructions  ?Lab tests and chest xray today >> will call Tammy with results and discuss whether to start a fluid pill  ? ?Follow up in 2 weeks with Dr. Halford Chessman or Nurse Practitioner ? ?Medication List:  ? ?Allergies as of 01/29/2022   ? ?   Reactions  ? Sulfonamide Derivatives   ? REACTION: headache  ? ?  ? ?  ?Medication List  ?  ? ?  ? Accurate as of January 29, 2022  4:40 PM. If you have any questions, ask your nurse or doctor.  ?  ?  ? ?  ? ?albuterol 108 (90 Base) MCG/ACT inhaler ?Commonly known as: Ventolin HFA ?  INHALE TWO PUFFS BY MOUTH EVERY 6 HOURS AS NEEDED ?  ?albuterol (2.5 MG/3ML) 0.083% nebulizer solution ?Commonly known as: PROVENTIL ?TAKE 3 MLS BY NEBULIZATION EVERY 6 (SIX) HOURS AS NEEDED FOR WHEEZING OR SHORTNESS OF BREATH. ?  ?ammonium lactate 12 % lotion ?Commonly known as: AmLactin ?Apply 1 application. topically as needed for dry skin. ?  ?Calcium Citrate-Vitamin D 315-5 MG-MCG Tabs ?take two tablet daily for your bone strength ?  ?hydrOXYzine 25 MG tablet ?Commonly known as: ATARAX ?1 tablet as needed ?  ?ibandronate 150 MG tablet ?Commonly known as: BONIVA ?1 tablet, with full glass of water and stay upright for 30 minutes. Watch for new jaw or hip pain ?  ?montelukast 10 MG tablet ?Commonly known as: SINGULAIR ?Take 1 tablet (10 mg total) by mouth at bedtime. ?  ?simvastatin  40 MG tablet ?Commonly known as: ZOCOR ?Take 1 tablet (40 mg total) by mouth daily. ?  ?Trelegy Ellipta 100-62.5-25 MCG/ACT Aepb ?Generic drug: Fluticasone-Umeclidin-Vilant ?Inhale 1 puff into the lungs daily. ?  ?Vitamin D3 25 MCG (1000 UT) Caps ?Take 1 capsule by mouth daily. ?  ? ?  ? ? ?Signature:  ?Chesley Mires, MD ?[place] ?Pager - [phone] ?01/29/2022, 4:40 PM ?  ? ? ? ? ? ? ? ? ?

## 2022-01-29 NOTE — Patient Instructions (Signed)
Lab tests and chest xray today >> will call Tammy with results and discuss whether to start a fluid pill  ? ?Follow up in 2 weeks with Dr. Halford Chessman or Nurse Practitioner ?

## 2022-01-30 LAB — COMPREHENSIVE METABOLIC PANEL
ALT: 10 U/L (ref 0–35)
AST: 18 U/L (ref 0–37)
Albumin: 3.8 g/dL (ref 3.5–5.2)
Alkaline Phosphatase: 67 U/L (ref 39–117)
BUN: 16 mg/dL (ref 6–23)
CO2: 31 mEq/L (ref 19–32)
Calcium: 10.6 mg/dL — ABNORMAL HIGH (ref 8.4–10.5)
Chloride: 103 mEq/L (ref 96–112)
Creatinine, Ser: 1.06 mg/dL (ref 0.40–1.20)
GFR: 50.63 mL/min — ABNORMAL LOW (ref >60.00)
Glucose, Bld: 98 mg/dL (ref 70–99)
Potassium: 5 mEq/L (ref 3.5–5.1)
Sodium: 140 mEq/L (ref 135–145)
Total Bilirubin: 0.7 mg/dL (ref 0.2–1.2)
Total Protein: 7.2 g/dL (ref 6.0–8.3)

## 2022-01-30 LAB — CBC WITH DIFFERENTIAL/PLATELET
Basophils Absolute: 0.1 10*3/uL (ref 0.0–0.1)
Basophils Relative: 1.1 % (ref 0.0–3.0)
Eosinophils Absolute: 0.7 10*3/uL (ref 0.0–0.7)
Eosinophils Relative: 7.8 % — ABNORMAL HIGH (ref 0.0–5.0)
HCT: 35.8 % — ABNORMAL LOW (ref 36.0–46.0)
Hemoglobin: 11.5 g/dL — ABNORMAL LOW (ref 12.0–15.0)
Lymphocytes Relative: 27.4 % (ref 12.0–46.0)
Lymphs Abs: 2.6 10*3/uL (ref 0.7–4.0)
MCHC: 32.3 g/dL (ref 30.0–36.0)
MCV: 92.3 fl (ref 78.0–100.0)
Monocytes Absolute: 1 10*3/uL (ref 0.1–1.0)
Monocytes Relative: 11.2 % (ref 3.0–12.0)
Neutro Abs: 4.9 10*3/uL (ref 1.4–7.7)
Neutrophils Relative %: 52.5 % (ref 43.0–77.0)
Platelets: 230 10*3/uL (ref 150.0–400.0)
RBC: 3.88 Mil/uL (ref 3.87–5.11)
RDW: 13.9 % (ref 11.5–15.5)
WBC: 9.3 10*3/uL (ref 4.0–10.5)

## 2022-01-30 NOTE — Addendum Note (Signed)
Addended by: Suzzanne Cloud E on: 01/30/2022 02:23 PM ? ? Modules accepted: Orders ? ?

## 2022-01-31 ENCOUNTER — Telehealth: Payer: Self-pay | Admitting: Pulmonary Disease

## 2022-01-31 ENCOUNTER — Other Ambulatory Visit (INDEPENDENT_AMBULATORY_CARE_PROVIDER_SITE_OTHER): Payer: Medicare HMO

## 2022-01-31 DIAGNOSIS — R0609 Other forms of dyspnea: Secondary | ICD-10-CM

## 2022-01-31 DIAGNOSIS — R911 Solitary pulmonary nodule: Secondary | ICD-10-CM

## 2022-01-31 LAB — BRAIN NATRIURETIC PEPTIDE: Pro B Natriuretic peptide (BNP): 44 pg/mL (ref 0.0–100.0)

## 2022-01-31 MED ORDER — FUROSEMIDE 20 MG PO TABS: 20.0000 mg | ORAL_TABLET | Freq: Every day | ORAL | 0 refills | Status: DC

## 2022-01-31 NOTE — Telephone Encounter (Signed)
?    Latest Ref Rng & Units 01/29/2022  ?  4:43 PM 05/31/2021  ? 10:30 AM 03/16/2018  ?  1:11 PM  ?CMP  ?Glucose 70 - 99 mg/dL 98   75   114    ?BUN 6 - 23 mg/dL '16   17   21    '$ ?Creatinine 0.40 - 1.20 mg/dL 1.06   0.99   0.79    ?Sodium 135 - 145 mEq/L 140   139   139    ?Potassium 3.5 - 5.1 mEq/L 5.0   3.8   4.7    ?Chloride 96 - 112 mEq/L 103   103   103    ?CO2 19 - 32 mEq/L '31   28   29    '$ ?Calcium 8.4 - 10.5 mg/dL 10.6   10.5   10.5    ?Total Protein 6.0 - 8.3 g/dL 7.2   7.4   6.8    ?Total Bilirubin 0.2 - 1.2 mg/dL 0.7   0.8   0.7    ?Alkaline Phos 39 - 117 U/L 67   69   68    ?AST 0 - 37 U/L '18   15   17    '$ ?ALT 0 - 35 U/L '10   11   14    '$ ? ? ? ?  Latest Ref Rng & Units 01/29/2022  ?  4:43 PM 05/31/2021  ? 10:30 AM 04/27/2018  ?  9:40 AM  ?CBC  ?WBC 4.0 - 10.5 K/uL 9.3   8.5   7.3    ?Hemoglobin 12.0 - 15.0 g/dL 11.5   11.1   11.5    ?Hematocrit 36.0 - 46.0 % 35.8   34.3   34.5    ?Platelets 150.0 - 400.0 K/uL 230.0   239.0   225.0    ? ? ?CXR - more prominent appearance of right lower lobe nodular lesion ? ? ?Results d/w pt's daughter, Lynelle Smoke.   ? ?Will order lasix 20 mg daily for 2 days to help with lower extremity edema. ? ?Will order CT chest with contrast to further assess Rt lower lobe lesion. ? ?

## 2022-02-11 ENCOUNTER — Ambulatory Visit (INDEPENDENT_AMBULATORY_CARE_PROVIDER_SITE_OTHER)
Admission: RE | Admit: 2022-02-11 | Discharge: 2022-02-11 | Disposition: A | Discharge status: Home / Self Care | Payer: Medicare HMO | Source: Ambulatory Visit | Attending: Pulmonary Disease | Admitting: Pulmonary Disease

## 2022-02-11 DIAGNOSIS — R911 Solitary pulmonary nodule: Secondary | ICD-10-CM

## 2022-02-11 DIAGNOSIS — J984 Other disorders of lung: Secondary | ICD-10-CM | POA: Diagnosis not present

## 2022-02-11 DIAGNOSIS — R0602 Shortness of breath: Secondary | ICD-10-CM | POA: Diagnosis not present

## 2022-02-11 DIAGNOSIS — R918 Other nonspecific abnormal finding of lung field: Secondary | ICD-10-CM | POA: Diagnosis not present

## 2022-02-11 DIAGNOSIS — I700 Atherosclerosis of aorta: Secondary | ICD-10-CM | POA: Diagnosis not present

## 2022-02-11 MED ORDER — IOHEXOL 300 MG/ML  SOLN
80.0000 mL | Freq: Once | INTRAMUSCULAR | Status: AC | PRN
  Administered 2022-02-11: 80 mL via INTRAVENOUS

## 2022-02-13 ENCOUNTER — Telehealth: Payer: Self-pay | Admitting: Pulmonary Disease

## 2022-02-13 MED ORDER — AMOXICILLIN-POT CLAVULANATE 875-125 MG PO TABS: 1.0000 | ORAL_TABLET | Freq: Two times a day (BID) | ORAL | 0 refills | Status: DC

## 2022-02-13 NOTE — Telephone Encounter (Signed)
CT chest 02/11/22 >> atherosclerosis, small to moderate HH, apical scarring, diffuse central airway thickening with clusters of micronodules Rt > Lt, 2.1 x 1.3 cm infiltrate LLL, 1.2 cm nodule in LLL, scarring in RML/lingula/LLL  ? ?Discussed results with pt's daughter.  Explained that she has changes in Lt lung concerning for recurrent pneumonia.  Reports that Susan Collins has more productive cough over the past few days.  Will send in script for augmentin.  She has appointment with Roxan Diesel scheduled for 02/14/22. ?

## 2022-02-14 ENCOUNTER — Ambulatory Visit: Payer: Medicare HMO | Admitting: Nurse Practitioner

## 2022-02-14 ENCOUNTER — Encounter: Payer: Self-pay | Admitting: Nurse Practitioner

## 2022-02-14 VITALS — BP 120/70 | HR 76 | Temp 98.3°F | Ht 63.0 in | Wt 159.6 lb

## 2022-02-14 DIAGNOSIS — J449 Chronic obstructive pulmonary disease, unspecified: Secondary | ICD-10-CM | POA: Diagnosis not present

## 2022-02-14 DIAGNOSIS — J189 Pneumonia, unspecified organism: Secondary | ICD-10-CM | POA: Diagnosis not present

## 2022-02-14 NOTE — Progress Notes (Signed)
? ?'@Patient'$  ID: Susan Collins, female    DOB: 1946-06-21, 76 y.o.   MRN: [id number] ? ?Chief Complaint  ?Patient presents with  ? Follow-up  ? ? ?Referring provider: ?Sueanne Margarita, DO ? ?HPI: ?76 year old female, former smoker (53 pack years) followed for COPD/emphysema, DOE, lung nodule.  She is a patient Dr. Juanetta Gosling and last seen in office 01/29/2022.  Past medical history significant for HLD, neuropathy, diverticulosis, tinnitus, colon polyps. ? ?TEST/EVENTS:  ?03/21/2015 PFTs: FVC 76, FEV1 66, ratio 66, TLC 113, DLCOunc 52.  Moderate obstructive airway disease with increased lung volumes and severe diffusion defect ?01/29/2022: BNP normal; eosinophils 700 ?02/11/2022 CT chest with contrast: Atherosclerosis present.  There is no evidence of acute pulmonary embolism.  There are no enlarged nodes.  There is a small to moderate hiatal hernia.  There is mild biapical scarring, similar to previous.  There is diffuse central airway thickening with scattered clustered micronodularity in both lungs, most notable in the right upper and lower lobes.  There is an ill-defined nodule or focal infiltrate in the left lower lobe which measures 2.1 x 1.3 cm, concerning for focal pneumonia.  There is some mild surrounding clustered nodularity.  There is an additional ill-defined nodular density more centrally in the left lower lobe measuring 1.2 cm.  There are probable areas of scarring or atelectasis medially in the right middle lobe and lingula and laterally in the left lower lobe. ? ?01/29/2022: OV with Dr. Halford Chessman.  Reported progressive DOE and lower extremity swelling.  Difficult to put shoes on.  Does use albuterol daily and feels like it helps.  Advised to continue Trelegy and Singulair.  BNP was normal.  CBC with differential showed eosinophils 700.  BMET with stable kidney function.  Started on 20 mg of Lasix for 2 days.  CXR showed a more prominent appearance of right lower lobe nodular lesion-CT chest with contrast was ordered  for further assessment.  There was a 2.1 x 1.3 cm infiltrate in the left lower lobe, concerning for recurrent pneumonia.  Sent in prescription for Augmentin. ? ?02/14/2022: Today-follow-up ?Patient presents today with daughter for follow-up after being seen for progressive DOE and lower extremity swelling a few weeks ago.  She then had a CT scan earlier this week with findings concerning for focal pneumonia in the left lung.  She was started on Augmentin course yesterday.  Today, she reports feeling relatively well.  Still has an occasional cough; minimally productive.  Shortness of breath is at baseline for the most part.  Still has some issues with activity tolerance but does feel as though this is relatively stable.  Lower extremity swelling has significantly improved with short Lasix course.  She denies any recent fevers, night sweats, weight loss, anorexia, hemoptysis.  She is eating and drinking well.  She continues on Trelegy daily.  Uses Singulair at bedtime.  Rare use of albuterol inhaler. ? ?Allergies  ?Allergen Reactions  ? Sulfonamide Derivatives   ?  REACTION: headache  ? ? ?Immunization History  ?Administered Date(s) Administered  ? Fluad Quad(high Dose 65+) 06/24/2021  ? Influenza, High Dose Seasonal PF 10/22/2018, 06/02/2019  ? Influenza,inj,Quad PF,6+ Mos 07/20/2013  ? Influenza-Unspecified 07/18/2020  ? Pneumococcal Conjugate-13 08/08/2014  ? Pneumococcal Polysaccharide-23 07/20/2013, 08/08/2014  ? Td 04/19/2009, 11/02/2019  ? Td (Adult) 04/19/2009  ? Tdap 12/12/2020  ? Zoster, Live 08/08/2013  ? ? ?Past Medical History:  ?Diagnosis Date  ? CHEST PAIN 11/20/2008  ? COLONIC POLYPS, HX OF 05/24/2007  ?  Diverticular disease   ? DYSPNEA ON EXERTION 12/19/2008  ? HYPERCHOLESTEROLEMIA 09/18/2008  ? 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 Peripheral neuropathy   ? POSTMENOPAUSAL SYNDROME 04/12/2008  ? 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 ? ?Tobacco History: ?Social History  ? ?Tobacco Use  ?Smoking  Status Former  ? Packs/day: 1.00  ? Years: 53.00  ? Pack years: 53.00  ? Types: Cigarettes  ? Start date: 1961  ? Quit date: 10/13/1996  ? Years since quitting: 25.3  ?Smokeless Tobacco Never  ?Tobacco Comments  ? smoked 1-1.5 PPD   ? ?Counseling given: Not Answered ?Tobacco comments: smoked 1-1.5 PPD  ? ? ?Outpatient Medications Prior to Visit  ?Medication Sig Dispense Refill  ? albuterol (PROVENTIL) (2.5 MG/3ML) 0.083% nebulizer solution TAKE 3 MLS BY NEBULIZATION EVERY 6 (SIX) HOURS AS NEEDED FOR WHEEZING OR SHORTNESS OF BREATH. 90 mL 10  ? albuterol (VENTOLIN HFA) 108 (90 Base) MCG/ACT inhaler INHALE TWO PUFFS BY MOUTH EVERY 6 HOURS AS NEEDED 18 each 3  ? ammonium lactate (AMLACTIN) 12 % lotion Apply 1 application. topically as needed for dry skin. 400 g 0  ? amoxicillin-clavulanate (AUGMENTIN) 875-125 MG tablet Take 1 tablet by mouth 2 (two) times daily. 14 tablet 0  ? Calcium Citrate-Vitamin D 315-5 MG-MCG TABS take two tablet daily for your bone strength    ? Cholecalciferol (VITAMIN D3) 1000 units CAPS Take 1 capsule by mouth daily.     ? Fluticasone-Umeclidin-Vilant (TRELEGY ELLIPTA) 100-62.5-25 MCG/ACT AEPB Inhale 1 puff into the lungs daily. 60 each 5  ? hydrOXYzine (ATARAX) 25 MG tablet 1 tablet as needed    ? ibandronate (BONIVA) 150 MG tablet 1 tablet, with full glass of water and stay upright for 30 minutes. Watch for new jaw or hip pain    ? montelukast (SINGULAIR) 10 MG tablet Take 1 tablet (10 mg total) by mouth at bedtime. 30 tablet 11  ? simvastatin (ZOCOR) 40 MG tablet Take 1 tablet (40 mg total) by mouth daily. 90 tablet 4  ? furosemide (LASIX) 20 MG tablet Take 1 tablet (20 mg total) by mouth daily for 2 doses. 2 tablet 0  ? ?No facility-administered medications prior to visit.  ? ? ? ?Review of Systems:  ? ?Constitutional: No weight loss or gain, night sweats, fevers, chills, fatigue, or lassitude. ?HEENT: No headaches, difficulty swallowing, tooth/dental problems, or sore throat. No sneezing,  itching, ear ache, nasal congestion, or post nasal drip ?CV:  No chest pain, orthopnea, PND, swelling in lower extremities, anasarca, dizziness, palpitations, syncope ?Resp: +shortness of breath with exertion (baseline); cough (minimally productive).  No hemoptysis. No wheezing.  No chest wall deformity ?GI:  No heartburn, indigestion, abdominal pain, nausea, vomiting, diarrhea, change in bowel habits, loss of appetite, bloody stools.  ?Skin: No rash, lesions, ulcerations ?MSK:  No joint pain or swelling.  No decreased range of motion.  No back pain. ?Neuro: No dizziness or lightheadedness.  ?Psych: No depression or anxiety. Mood stable.  ? ? ? ?Physical Exam: ? ?BP 120/70 (BP Location: Left Arm, Cuff Size: Normal)   Pulse 76   Temp 98.3 ?F (36.8 ?C)   Ht '5\' 3"'$  (1.6 m)   Wt 159 lb 9.6 oz (72.4 kg)   SpO2 93%   BMI 28.27 kg/m?  ? ?GEN: Pleasant, interactive, well-nourished; elderly; in no acute distress. ?HEENT:  Normocephalic and atraumatic. PERRLA. Sclera white. Nasal turbinates pink, moist and patent bilaterally. No rhinorrhea present. Oropharynx pink and moist, without exudate or edema.  No lesions, ulcerations, or postnasal drip.  ?NECK:  Supple w/ fair ROM. No JVD present. Normal carotid impulses w/o bruits. Thyroid symmetrical with no goiter or nodules palpated. No lymphadenopathy.   ?CV: RRR, no m/r/g, no peripheral edema. Pulses intact, +2 bilaterally. No cyanosis, pallor or clubbing. ?PULMONARY:  Unlabored, regular breathing. Clear bilaterally A&P w/o wheezes/rales/rhonchi. No accessory muscle use. No dullness to percussion. ?GI: BS present and normoactive. Soft, non-tender to palpation. No organomegaly or masses detected. No CVA tenderness. ?MSK: No erythema, warmth or tenderness. Cap refil <2 sec all extrem. No deformities or joint swelling noted.  ?Neuro: A/Ox3. No focal deficits noted.   ?Skin: Warm, no lesions or rashe ?Psych: Normal affect and behavior. Judgement and thought content appropriate.   ? ? ? ?Lab Results: ? ?CBC ?   ?Component Value Date/Time  ? WBC 9.3 01/29/2022 1643  ? RBC 3.88 01/29/2022 1643  ? HGB 11.5 (L) 01/29/2022 1643  ? HCT 35.8 (L) 01/29/2022 1643  ? PLT 230.0 01/29/2022 1643  ? [place]

## 2022-02-14 NOTE — Progress Notes (Signed)
Reviewed and agree with assessment/plan. ? ? ?Chesley Mires, MD ?[place] ?02/14/2022, 2:05 PM ?Pager:  [phone] ? ?

## 2022-02-14 NOTE — Patient Instructions (Addendum)
Continue Trelegy 1 puff daily. Brush tongue and rinse mouth afterwards ?Continue Albuterol inhaler 2 puffs or 3 mL neb every 6 hours as needed for shortness of breath or wheezing. Notify if symptoms persist despite rescue inhaler/neb use. ?Continue singulair 10 mg At bedtime  ? ?Complete augmentin course as previously prescribed. Take daily probiotic while taking ?Mucinex 600 mg Twice daily  ? ?Repeat CT chest in 6 weeks.  ? ?Follow up in 2 weeks with Dr. Halford Chessman or Alanson Aly. If symptoms do not improve or worsen, please contact office for sooner follow up or seek emergency care. ?

## 2022-02-14 NOTE — Assessment & Plan Note (Signed)
Breathing overall stable. No evidence of bronchospasm. Advised that if she develops any increased SOB, wheezing, or worsening symptoms to notify us and we can consider prednisone course as well. Continue on triple therapy regimen and PRN albuterol. Continue singulair for trigger prevention. She does have a significantly elevated eosinophil count, downtrending from 8 months ago but need to monitor this moving forward in conjunction with her symptoms. ?

## 2022-02-14 NOTE — Assessment & Plan Note (Addendum)
Clinically doing well. Cough has cleared some and no complaints regarding her breathing. No issues with augmentin. Advised she take daily probiotic while on abx course. Provided with strict return precautions. Repeat CT chest in 6 weeks to ensure resolution/improvement. No new O2 demand. ? ?Patient Instructions  ?Continue Trelegy 1 puff daily. Brush tongue and rinse mouth afterwards ?Continue Albuterol inhaler 2 puffs or 3 mL neb every 6 hours as needed for shortness of breath or wheezing. Notify if symptoms persist despite rescue inhaler/neb use. ?Continue singulair 10 mg At bedtime  ? ?Complete augmentin course as previously prescribed. Take daily probiotic while taking ?Mucinex 600 mg Twice daily  ? ?Repeat CT chest in 6 weeks.  ? ?Follow up in 2 weeks with Dr. Halford Chessman or Alanson Aly. If symptoms do not improve or worsen, please contact office for sooner follow up or seek emergency care. ? ? ?

## 2022-03-07 ENCOUNTER — Encounter: Payer: Self-pay | Admitting: Nurse Practitioner

## 2022-03-07 ENCOUNTER — Ambulatory Visit: Payer: Medicare HMO | Admitting: Nurse Practitioner

## 2022-03-07 VITALS — BP 142/62 | HR 77 | Temp 98.9°F | Ht 63.0 in | Wt 161.0 lb

## 2022-03-07 DIAGNOSIS — J189 Pneumonia, unspecified organism: Secondary | ICD-10-CM | POA: Diagnosis not present

## 2022-03-07 DIAGNOSIS — J449 Chronic obstructive pulmonary disease, unspecified: Secondary | ICD-10-CM

## 2022-03-07 MED ORDER — ALBUTEROL SULFATE HFA 108 (90 BASE) MCG/ACT IN AERS: INHALATION_SPRAY | RESPIRATORY_TRACT | 3 refills | Status: DC

## 2022-03-07 NOTE — Assessment & Plan Note (Addendum)
Clinically improved. Cough has resolved. Completed augmentin without any difficulties. Repeat CT chest scheduled for 6/30 to ensure resolution/improvement.   Patient Instructions  Continue Trelegy 1 puff daily. Brush tongue and rinse mouth afterwards Continue Albuterol inhaler 2 puffs or 3 mL neb every 6 hours as needed for shortness of breath or wheezing. Notify if symptoms persist despite rescue inhaler/neb use. Continue singulair 10 mg At bedtime   CT chest scheduled for the end of June   Follow up the first week of July with Dr. Halford Chessman or Alanson Aly. If symptoms do not improve or worsen, please contact office for sooner follow up or seek emergency care.

## 2022-03-07 NOTE — Patient Instructions (Addendum)
Continue Trelegy 1 puff daily. Brush tongue and rinse mouth afterwards Continue Albuterol inhaler 2 puffs or 3 mL neb every 6 hours as needed for shortness of breath or wheezing. Notify if symptoms persist despite rescue inhaler/neb use. Continue singulair 10 mg At bedtime   CT chest scheduled for the end of June   Follow up the first week of July with Dr. Halford Chessman or Alanson Aly. If symptoms do not improve or worsen, please contact office for sooner follow up or seek emergency care.

## 2022-03-07 NOTE — Assessment & Plan Note (Addendum)
Appears compensated on current regimen. Continue triple therapy with Trelegy. Albuterol rescue refilled today. Continue singulair for trigger prevention. Eosinophils 700 in April; consider rechecking if worsening symptoms develop.

## 2022-03-07 NOTE — Progress Notes (Signed)
$'@Patient'X$  ID: Susan Collins, female    DOB: June 07, 1946, 76 y.o.   MRN: [id number]  Chief Complaint  Patient presents with   Follow-up    Follow up. Patient has no complaints.     Referring provider: Sueanne Margarita, DO  HPI: 76 year old female, former smoker (52 pack years) followed for COPD/emphysema, DOE, lung nodule.  She is a patient Dr. Juanetta Gosling and last seen in office 01/29/2022.  Past medical history significant for HLD, neuropathy, diverticulosis, tinnitus, colon polyps.  TEST/EVENTS:  03/21/2015 PFTs: FVC 76, FEV1 66, ratio 66, TLC 113, DLCOunc 52.  Moderate obstructive airway disease with increased lung volumes and severe diffusion defect 01/29/2022: BNP normal; eosinophils 700 02/11/2022 CT chest with contrast: Atherosclerosis present.  There is no evidence of acute pulmonary embolism.  There are no enlarged nodes.  There is a small to moderate hiatal hernia.  There is mild biapical scarring, similar to previous.  There is diffuse central airway thickening with scattered clustered micronodularity in both lungs, most notable in the right upper and lower lobes.  There is an ill-defined nodule or focal infiltrate in the left lower lobe which measures 2.1 x 1.3 cm, concerning for focal pneumonia.  There is some mild surrounding clustered nodularity.  There is an additional ill-defined nodular density more centrally in the left lower lobe measuring 1.2 cm.  There are probable areas of scarring or atelectasis medially in the right middle lobe and lingula and laterally in the left lower lobe.  01/29/2022: OV with Dr. Halford Chessman.  Reported progressive DOE and lower extremity swelling.  Difficult to put shoes on.  Does use albuterol daily and feels like it helps.  Advised to continue Trelegy and Singulair.  BNP was normal.  CBC with differential showed eosinophils 700.  BMET with stable kidney function.  Started on 20 mg of Lasix for 2 days.  CXR showed a more prominent appearance of right lower lobe nodular  lesion-CT chest with contrast was ordered for further assessment.  There was a 2.1 x 1.3 cm infiltrate in the left lower lobe, concerning for recurrent pneumonia.  Sent in prescription for Augmentin.  02/14/2022: OV with Juandavid Dallman NP for follow-up after being seen for progressive DOE and lower extremity swelling a few weeks ago.  She then had a CT scan earlier this week with findings concerning for focal pneumonia in the left lung.  She was started on Augmentin course yesterday.  Today, she reports feeling relatively well.  Still has an occasional cough; minimally productive.  Shortness of breath is at baseline for the most part.  Still has some issues with activity tolerance but does feel as though this is relatively stable.  Lower extremity swelling has significantly improved with short Lasix course.  She denies any recent fevers, night sweats, weight loss, anorexia, hemoptysis.  She is eating and drinking well.  She continues on Trelegy daily.  Uses Singulair at bedtime.  Occasional use of albuterol inhaler. Advised to complete augmentin course and plans for follow up CT in 6 weeks. Hx of significantly elevated eosinophils, which was downtrending but will need to be monitored in conjunction with her symptoms.  03/07/2022: Today-follow-up Patient presents today with daughter for follow-up after being treated for pneumonia of the left lung.  Reports feeling well and feels like cough has resolved.  Shortness of breath is at her baseline.  She and her daughter feel like she has recovered well without any difficulties.  Her lower extremity edema is stable.  She denies  any recent fevers, night sweats, weight loss, anorexia, hemoptysis.  She continues on Trelegy daily and uses Singulair at bedtime.  Uses albuterol occasionally and needs a refill for this.  Allergies  Allergen Reactions   Sulfonamide Derivatives     REACTION: headache    Immunization History  Administered Date(s) Administered   Fluad Quad(high  Dose 65+) 06/24/2021   Influenza, High Dose Seasonal PF 10/22/2018, 06/02/2019   Influenza,inj,Quad PF,6+ Mos 07/20/2013   Influenza-Unspecified 07/18/2020   Pneumococcal Conjugate-13 08/08/2014   Pneumococcal Polysaccharide-23 07/20/2013, 08/08/2014   Td 04/19/2009, 11/02/2019   Td (Adult) 04/19/2009   Tdap 12/12/2020   Zoster, Live 08/08/2013    Past Medical History:  Diagnosis Date   CHEST PAIN 11/20/2008   COLONIC POLYPS, HX OF 05/24/2007   Diverticular disease    DYSPNEA ON EXERTION 12/19/2008   HYPERCHOLESTEROLEMIA 09/18/2008   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   Peripheral neuropathy    POSTMENOPAUSAL SYNDROME 04/12/2008   TINNITUS, 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    Tobacco History: Social History   Tobacco Use  Smoking Status Former   Packs/day: 1.00   Years: 53.00   Pack years: 53.00   Types: Cigarettes   Start date: 1961   Quit date: 10/13/1996   Years since quitting: 25.4  Smokeless Tobacco Never  Tobacco Comments   smoked 1-1.5 PPD    Counseling given: Not Answered Tobacco comments: smoked 1-1.5 PPD    Outpatient Medications Prior to Visit  Medication Sig Dispense Refill   albuterol (PROVENTIL) (2.5 MG/3ML) 0.083% nebulizer solution TAKE 3 MLS BY NEBULIZATION EVERY 6 (SIX) HOURS AS NEEDED FOR WHEEZING OR SHORTNESS OF BREATH. 90 mL 10   ammonium lactate (AMLACTIN) 12 % lotion Apply 1 application. topically as needed for dry skin. 400 g 0   amoxicillin-clavulanate (AUGMENTIN) 875-125 MG tablet Take 1 tablet by mouth 2 (two) times daily. 14 tablet 0   Calcium Citrate-Vitamin D 315-5 MG-MCG TABS take two tablet daily for your bone strength     Cholecalciferol (VITAMIN D3) 1000 units CAPS Take 1 capsule by mouth daily.      Fluticasone-Umeclidin-Vilant (TRELEGY ELLIPTA) 100-62.5-25 MCG/ACT AEPB Inhale 1 puff into the lungs daily. 60 each 5   hydrOXYzine (ATARAX) 25 MG tablet 1 tablet as needed     ibandronate (BONIVA) 150 MG tablet 1 tablet, with full glass  of water and stay upright for 30 minutes. Watch for new jaw or hip pain     montelukast (SINGULAIR) 10 MG tablet Take 1 tablet (10 mg total) by mouth at bedtime. 30 tablet 11   simvastatin (ZOCOR) 40 MG tablet Take 1 tablet (40 mg total) by mouth daily. 90 tablet 4   albuterol (VENTOLIN HFA) 108 (90 Base) MCG/ACT inhaler INHALE TWO PUFFS BY MOUTH EVERY 6 HOURS AS NEEDED 18 each 3   furosemide (LASIX) 20 MG tablet Take 1 tablet (20 mg total) by mouth daily for 2 doses. 2 tablet 0   No facility-administered medications prior to visit.     Review of Systems:   Constitutional: No weight loss or gain, night sweats, fevers, chills, fatigue, or lassitude. HEENT: No headaches, difficulty swallowing, tooth/dental problems, or sore throat. No sneezing, itching, ear ache, nasal congestion, or post nasal drip CV:  No chest pain, orthopnea, PND, swelling in lower extremities, anasarca, dizziness, palpitations, syncope Resp: +shortness of breath with exertion (baseline). No cough. No hemoptysis. No wheezing.  No chest wall deformity GI:  No heartburn, indigestion, abdominal pain, nausea, vomiting, diarrhea, change in bowel  habits, loss of appetite, bloody stools.  Skin: No rash, lesions, ulcerations MSK:  No joint pain or swelling.  No decreased range of motion.  No back pain. Neuro: No dizziness or lightheadedness.  Psych: No depression or anxiety. Mood stable.     Physical Exam:  BP (!) 142/62 (BP Location: Right Arm, Patient Position: Sitting, Cuff Size: Normal)   Pulse 77   Temp 98.9 F (37.2 C) (Oral)   Ht '5\' 3"'$  (1.6 m)   Wt 161 lb (73 kg)   SpO2 95%   BMI 28.52 kg/m   GEN: Pleasant, interactive, well-nourished; elderly; in no acute distress. HEENT:  Normocephalic and atraumatic. PERRLA. Sclera white. Nasal turbinates pink, moist and patent bilaterally. No rhinorrhea present. Oropharynx pink and moist, without exudate or edema. No lesions, ulcerations, or postnasal drip.  NECK:  Supple  w/ fair ROM. No JVD present. Normal carotid impulses w/o bruits. Thyroid symmetrical with no goiter or nodules palpated. No lymphadenopathy.   CV: RRR, no m/r/g, no peripheral edema. Pulses intact, +2 bilaterally. No cyanosis, pallor or clubbing. PULMONARY:  Unlabored, regular breathing. Clear bilaterally A&P w/o wheezes/rales/rhonchi. No accessory muscle use. No dullness to percussion. GI: BS present and normoactive. Soft, non-tender to palpation. No organomegaly or masses detected. No CVA tenderness. MSK: No erythema, warmth or tenderness. Cap refil <2 sec all extrem. No deformities or joint swelling noted.  Neuro: A/Ox3. No focal deficits noted.   Skin: Warm, no lesions or rashe Psych: Normal affect and behavior. Judgement and thought content appropriate.     Lab Results:  CBC    Component Value Date/Time   WBC 9.3 01/29/2022 1643   RBC 3.88 01/29/2022 1643   HGB 11.5 (L) 01/29/2022 1643   HCT 35.8 (L) 01/29/2022 1643   PLT 230.0 01/29/2022 1643   MCV 92.3 01/29/2022 1643   MCHC 32.3 01/29/2022 1643   RDW 13.9 01/29/2022 1643   LYMPHSABS 2.6 01/29/2022 1643   MONOABS 1.0 01/29/2022 1643   EOSABS 0.7 01/29/2022 1643   BASOSABS 0.1 01/29/2022 1643    BMET    Component Value Date/Time   NA 140 01/29/2022 1643   K 5.0 01/29/2022 1643   CL 103 01/29/2022 1643   CO2 31 01/29/2022 1643   GLUCOSE 98 01/29/2022 1643   BUN 16 01/29/2022 1643   CREATININE 1.06 01/29/2022 1643   CALCIUM 10.6 (H) 01/29/2022 1643   GFRNONAA 82.17 06/13/2010 0934   GFRAA 109 04/12/2008 0000    BNP No results found for: BNP   Imaging:  CT Chest W Contrast  Result Date: 02/11/2022 CLINICAL DATA:  Abnormal x-ray. Increased shortness of breath with exertion. History of chronic obstructive pulmonary disease. EXAM: CT CHEST WITH CONTRAST TECHNIQUE: Multidetector CT imaging of the chest was performed during intravenous contrast administration. RADIATION DOSE REDUCTION: This exam was performed  according to the departmental dose-optimization program which includes automated exposure control, adjustment of the mA and/or kV according to patient size and/or use of iterative reconstruction technique. CONTRAST:  62m OMNIPAQUE IOHEXOL 300 MG/ML  SOLN COMPARISON:  Chest radiographs 01/29/2022 and 08/02/2021. FINDINGS: Cardiovascular: Atherosclerosis of the aorta, great vessels and coronary arteries with irregular partially calcified plaque in the proximal left subclavian artery. No acute vascular findings. No evidence of acute pulmonary embolism. The heart size is normal. There is no pericardial effusion. Mediastinum/Nodes: There are no enlarged mediastinal, hilar or axillary lymph nodes.Small to moderate hiatal hernia with mild distal esophageal wall thickening. The thyroid gland and trachea appear unremarkable. Lungs/Pleura: No  pleural effusion or pneumothorax. Mild biapical scarring, similar to prior radiographs. There is diffuse central airway thickening with scattered clustered micro nodularity in both lungs, most notable in the right upper and lower lobes. There is an ill-defined nodule or focal infiltrate in the left lower lobe which measures 2.1 x 1.3 cm on image 79/3. This is visible on the scout image, not apparent on the recent radiographs and most likely represents focal pneumonia. There is mild surrounding clustered nodularity. There is an additional ill-defined nodular density more centrally in the left lower lobe measuring 1.2 cm on image 90/3. Probable areas of scarring or atelectasis medially in the right middle lobe and lingula and laterally in the left lower lobe. Upper abdomen: No acute findings are seen within the visualized upper abdomen. No adrenal mass. Musculoskeletal/Chest wall: There is no chest wall mass or suspicious osseous finding. IMPRESSION: 1. New focal left lower lobe airspace disease compared with recent radiographs, most consistent with pneumonia. There are additional  scattered clustered nodules in both lungs as can be seen with atypical mycobacterial infection. Findings are atypical for malignancy. Recommend chest CT follow-in 3 months. 2. No adenopathy, pleural effusion or acute vascular findings. 3. Hiatal hernia with distal esophageal wall thickening. 4. Coronary and Aortic Atherosclerosis (ICD10-I70.0). Electronically Signed   By: Richardean Sale M.D.   On: 02/11/2022 15:56         Latest Ref Rng & Units 03/21/2015    9:36 AM  PFT Results  FVC-Pre L 1.92    FVC-Predicted Pre % 67    FVC-Post L 2.18    FVC-Predicted Post % 76    Pre FEV1/FVC % % 67    Post FEV1/FCV % % 66    FEV1-Pre L 1.29    FEV1-Predicted Pre % 60    FEV1-Post L 1.43    DLCO uncorrected ml/min/mmHg 12.06    DLCO UNC% % 52    DLVA Predicted % 93    TLC L 5.56    TLC % Predicted % 113    RV % Predicted % 178      No results found for: NITRICOXIDE      Assessment & Plan:   CAP (community acquired pneumonia) Clinically improved. Cough has resolved. Completed augmentin without any difficulties. Repeat CT chest scheduled for 6/30 to ensure resolution/improvement.   Patient Instructions  Continue Trelegy 1 puff daily. Brush tongue and rinse mouth afterwards Continue Albuterol inhaler 2 puffs or 3 mL neb every 6 hours as needed for shortness of breath or wheezing. Notify if symptoms persist despite rescue inhaler/neb use. Continue singulair 10 mg At bedtime   CT chest scheduled for the end of June   Follow up the first week of July with Dr. Halford Chessman or Alanson Aly. If symptoms do not improve or worsen, please contact office for sooner follow up or seek emergency care.    COPD with asthma ([place]) Appears compensated on current regimen. Continue triple therapy with Trelegy. Albuterol rescue refilled today. Continue singulair for trigger prevention. Eosinophils 700 in April; consider rechecking if worsening symptoms develop.    I spent 28 minutes of dedicated to the care  of this patient on the date of this encounter to include pre-visit review of records, face-to-face time with the patient discussing conditions above, post visit ordering of testing, clinical documentation with the electronic health record, making appropriate referrals as documented, and communicating necessary findings to members of the patients care team.  Clayton Bibles, NP 03/07/2022  Pt  aware and understands NP's role.

## 2022-03-12 NOTE — Progress Notes (Signed)
Reviewed and agree with assessment/plan.   Jenefer Woerner, MD [facility] 03/12/2022, 12:50 PM Pager:  [phone]  

## 2022-03-27 ENCOUNTER — Ambulatory Visit: Payer: Medicare HMO | Admitting: Podiatry

## 2022-03-28 ENCOUNTER — Other Ambulatory Visit: Payer: Medicare HMO

## 2022-04-03 ENCOUNTER — Ambulatory Visit: Payer: Medicare HMO | Admitting: Podiatry

## 2022-04-11 ENCOUNTER — Ambulatory Visit (INDEPENDENT_AMBULATORY_CARE_PROVIDER_SITE_OTHER)
Admission: RE | Admit: 2022-04-11 | Discharge: 2022-04-11 | Disposition: A | Discharge status: Home / Self Care | Payer: Medicare HMO | Source: Ambulatory Visit | Attending: Nurse Practitioner | Admitting: Nurse Practitioner

## 2022-04-11 DIAGNOSIS — J189 Pneumonia, unspecified organism: Secondary | ICD-10-CM

## 2022-04-11 DIAGNOSIS — R918 Other nonspecific abnormal finding of lung field: Secondary | ICD-10-CM | POA: Diagnosis not present

## 2022-04-11 DIAGNOSIS — J9809 Other diseases of bronchus, not elsewhere classified: Secondary | ICD-10-CM | POA: Diagnosis not present

## 2022-04-11 DIAGNOSIS — J9811 Atelectasis: Secondary | ICD-10-CM | POA: Diagnosis not present

## 2022-04-17 NOTE — Progress Notes (Signed)
Please notify patient that her CT scan showed an area of possible infection/inflammation or atelectasis in her right lower lobe. She is scheduled for an appointment with me tomorrow; however, please determine if she is having any infectious symptoms (productive cough, increased SOB, fevers, fatigue). If not, we can discuss her CT in more detail tomorrow and determine next steps. Thanks!

## 2022-04-18 ENCOUNTER — Encounter: Payer: Self-pay | Admitting: Nurse Practitioner

## 2022-04-18 ENCOUNTER — Ambulatory Visit: Payer: Medicare HMO | Admitting: Nurse Practitioner

## 2022-04-18 DIAGNOSIS — J189 Pneumonia, unspecified organism: Secondary | ICD-10-CM | POA: Diagnosis not present

## 2022-04-18 DIAGNOSIS — R9389 Abnormal findings on diagnostic imaging of other specified body structures: Secondary | ICD-10-CM | POA: Diagnosis not present

## 2022-04-18 DIAGNOSIS — J449 Chronic obstructive pulmonary disease, unspecified: Secondary | ICD-10-CM

## 2022-04-18 NOTE — Assessment & Plan Note (Signed)
She has tree-in-bud nodularity in the right upper lobe, left upper lobe cluster of peribronchovascular pulmonary micronodules, and right lower lobe base patchy airspace opacity with linear atelectasis.  There is a stable left lower lobe peribronchovascular 1.2 cm pulmonary nodule and interval decrease in the left lower lobe subpleural opacity.  She currently is doing well and has no infectious symptoms.  Her breathing is overall stable from her baseline.  We discussed possible further work-up; however, would not recommend bronchoscopy at this point given her lack of symptoms, age and frailty.  Will discuss CT results with Dr. Halford Chessman to see if he has any further recommendations.  In the interim, advised that she continue her inhalers as previously prescribed and activity as tolerated.

## 2022-04-18 NOTE — Assessment & Plan Note (Signed)
Interval decrease in previously identified consolidation.  She is clinically improved and back to baseline.  There were scattered areas of tree-in-bud nodularity, which can be seen with chronic atypical infections.  She also had interval development of a right lower lobe base patchy airspace opacity, which is likely atelectasis given her lack of infectious symptoms.  She looked great upon exam today and lungs were clear.  Advised them to monitor and if she develops any new cough, fatigue, increased shortness of breath or other new symptoms to notify us.  Patient Instructions  Continue Trelegy 1 puff daily. Brush tongue and rinse mouth afterwards Continue Albuterol inhaler 2 puffs or 3 mL neb every 6 hours as needed for shortness of breath or wheezing. Notify if symptoms persist despite rescue inhaler/neb use. Continue singulair 10 mg At bedtime    Follow up in 3 months with Dr. Halford Chessman. If symptoms do not improve or worsen, please contact office for sooner follow up or seek emergency care.

## 2022-04-18 NOTE — Progress Notes (Signed)
$'@Patient'D$  ID: Gertie Fey, female    DOB: 1946/04/10, 76 y.o.   MRN: [id number]  Chief Complaint  Patient presents with   Follow-up    F/u to review CT results, SOB on exertion. No cough or wheeze, no other questions/concerns    Referring provider: Sueanne Margarita, DO  HPI: 76 year old female, former smoker (53 pack years) followed for COPD/emphysema, DOE, lung nodule.  She is a patient Dr. Juanetta Gosling and last seen in office 03/07/2022.  Past medical history significant for HLD, neuropathy, diverticulosis, tinnitus, colon polyps, dementia.  TEST/EVENTS:  03/21/2015 PFTs: FVC 76, FEV1 66, ratio 66, TLC 113, DLCOunc 52.  Moderate obstructive airway disease with increased lung volumes and severe diffusion defect 01/29/2022: BNP normal; eosinophils 700 02/11/2022 CT chest with contrast: Atherosclerosis present.  There is no evidence of acute pulmonary embolism.  There are no enlarged nodes.  There is a small to moderate hiatal hernia.  There is mild biapical scarring, similar to previous.  There is diffuse central airway thickening with scattered clustered micronodularity in both lungs, most notable in the right upper and lower lobes.  There is an ill-defined nodule or focal infiltrate in the left lower lobe which measures 2.1 x 1.3 cm, concerning for focal pneumonia.  There is some mild surrounding clustered nodularity.  There is an additional ill-defined nodular density more centrally in the left lower lobe measuring 1.2 cm.  There are probable areas of scarring or atelectasis medially in the right middle lobe and lingula and laterally in the left lower lobe. 04/11/2022 CT chest without contrast: No LAD.  There is stable slightly more prominent right upper lobe tree-in-bud nodularity.  There is interval development of left upper lobe cluster of peribronchovascular pulmonary micronodules.  There is stable left lower lobe peribronchovascular 1.2 cm pulmonary nodule.  There is interval decrease in size of left  lower lobe subpleural 1.6 x 1 cm opacity.  There is interval development of right lower lobe base patchy airspace opacity and linear atelectasis, infectious/inflammatory versus atelectasis or combination.  There is diffuse mild bronchial wall thickening.  01/29/2022: OV with Dr. Halford Chessman.  Reported progressive DOE and lower extremity swelling.  Difficult to put shoes on.  Does use albuterol daily and feels like it helps.  Advised to continue Trelegy and Singulair.  BNP was normal.  CBC with differential showed eosinophils 700.  BMET with stable kidney function.  Started on 20 mg of Lasix for 2 days.  CXR showed a more prominent appearance of right lower lobe nodular lesion-CT chest with contrast was ordered for further assessment.  There was a 2.1 x 1.3 cm infiltrate in the left lower lobe, concerning for recurrent pneumonia.  Sent in prescription for Augmentin.  02/14/2022: OV with Georganna Maxson NP for follow-up after being seen for progressive DOE and lower extremity swelling a few weeks ago.  She then had a CT scan earlier this week with findings concerning for focal pneumonia in the left lung.  She was started on Augmentin course yesterday.  Today, she reports feeling relatively well.  Still has an occasional cough; minimally productive.  Shortness of breath is at baseline for the most part.  Still has some issues with activity tolerance but does feel as though this is relatively stable.  Lower extremity swelling has significantly improved with short Lasix course.  She denies any recent fevers, night sweats, weight loss, anorexia, hemoptysis.  She is eating and drinking well.  She continues on Trelegy daily.  Uses Singulair at bedtime.  Occasional use of albuterol inhaler. Advised to complete augmentin course and plans for follow up CT in 6 weeks. Hx of significantly elevated eosinophils, which was downtrending but will need to be monitored in conjunction with her symptoms.  03/07/2022: OV with Kassaundra Hair NP for follow-up after  being treated for pneumonia of the left lung.  Reports feeling well and feels like cough has resolved.  Shortness of breath is at her baseline.  She and her daughter feel like she has recovered well without any difficulties.  Her lower extremity edema is stable.  She denies any recent fevers, night sweats, weight loss, anorexia, hemoptysis.  She continues on Trelegy daily and uses Singulair at bedtime.  Uses albuterol occasionally and needs a refill for this.  Plans for repeat CT 6/30 to ensure resolution/improvement.  04/18/2022: Today-follow-up Patient presents today with daughter for follow-up after repeat CT scan.  Her daughter helps to provide history.  They report that she has been doing well since we saw her last.  She has not had any issues with cough and her breathing is at her baseline.  She denies any fevers, night sweats, weight loss, anorexia, hemoptysis.  She is eating and drinking well.  She continues on Trelegy daily and uses Singulair at bedtime.  She uses albuterol every now and then.   Allergies  Allergen Reactions   Sulfonamide Derivatives     REACTION: headache    Immunization History  Administered Date(s) Administered   Fluad Quad(high Dose 65+) 06/24/2021   Influenza, High Dose Seasonal PF 10/22/2018, 06/02/2019   Influenza,inj,Quad PF,6+ Mos 07/20/2013   Influenza-Unspecified 07/18/2020   Pneumococcal Conjugate-13 08/08/2014   Pneumococcal Polysaccharide-23 07/20/2013, 08/08/2014   Td 04/19/2009, 11/02/2019   Td (Adult) 04/19/2009   Tdap 12/12/2020   Zoster, Live 08/08/2013    Past Medical History:  Diagnosis Date   CHEST PAIN 11/20/2008   COLONIC POLYPS, HX OF 05/24/2007   Diverticular disease    DYSPNEA ON EXERTION 12/19/2008   HYPERCHOLESTEROLEMIA 09/18/2008   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   Peripheral neuropathy    POSTMENOPAUSAL SYNDROME 04/12/2008   TINNITUS, 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    Tobacco History: Social History   Tobacco Use   Smoking Status Former   Packs/day: 1.00   Years: 53.00   Total pack years: 53.00   Types: Cigarettes   Start date: 1961   Quit date: 10/13/1996   Years since quitting: 25.5  Smokeless Tobacco Never  Tobacco Comments   smoked 1-1.5 PPD    Counseling given: Not Answered Tobacco comments: smoked 1-1.5 PPD    Outpatient Medications Prior to Visit  Medication Sig Dispense Refill   albuterol (PROVENTIL) (2.5 MG/3ML) 0.083% nebulizer solution TAKE 3 MLS BY NEBULIZATION EVERY 6 (SIX) HOURS AS NEEDED FOR WHEEZING OR SHORTNESS OF BREATH. 90 mL 10   albuterol (VENTOLIN HFA) 108 (90 Base) MCG/ACT inhaler INHALE TWO PUFFS BY MOUTH EVERY 6 HOURS AS NEEDED 18 each 3   ammonium lactate (AMLACTIN) 12 % lotion Apply 1 application. topically as needed for dry skin. 400 g 0   Calcium Citrate-Vitamin D 315-5 MG-MCG TABS take two tablet daily for your bone strength     Cholecalciferol (VITAMIN D3) 1000 units CAPS Take 1 capsule by mouth daily.      Fluticasone-Umeclidin-Vilant (TRELEGY ELLIPTA) 100-62.5-25 MCG/ACT AEPB Inhale 1 puff into the lungs daily. 60 each 5   ibandronate (BONIVA) 150 MG tablet 1 tablet, with full glass of water and stay upright for 30 minutes. Watch for new  jaw or hip pain     montelukast (SINGULAIR) 10 MG tablet Take 1 tablet (10 mg total) by mouth at bedtime. 30 tablet 11   simvastatin (ZOCOR) 40 MG tablet Take 1 tablet (40 mg total) by mouth daily. 90 tablet 4   amoxicillin-clavulanate (AUGMENTIN) 875-125 MG tablet Take 1 tablet by mouth 2 (two) times daily. (Patient not taking: Reported on 04/18/2022) 14 tablet 0   furosemide (LASIX) 20 MG tablet Take 1 tablet (20 mg total) by mouth daily for 2 doses. 2 tablet 0   hydrOXYzine (ATARAX) 25 MG tablet 1 tablet as needed     No facility-administered medications prior to visit.     Review of Systems:   Constitutional: No weight loss or gain, night sweats, fevers, chills, fatigue, or lassitude. HEENT: No headaches, difficulty  swallowing, tooth/dental problems, or sore throat. No sneezing, itching, ear ache, nasal congestion, or post nasal drip CV:  +swelling in lower extremities (stable). No chest pain, orthopnea, PND, anasarca, dizziness, palpitations, syncope Resp: +shortness of breath with exertion (baseline). No cough. No hemoptysis. No wheezing.  No chest wall deformity GI:  No heartburn, indigestion, abdominal pain, nausea, vomiting, diarrhea, change in bowel habits, loss of appetite, bloody stools.  Skin: No rash, lesions, ulcerations MSK:  No joint pain or swelling.  No decreased range of motion.  No back pain. Neuro: No dizziness or lightheadedness.  Psych: No depression or anxiety. Mood stable.     Physical Exam:  BP 120/68   Pulse 75   Ht '5\' 3"'$  (1.6 m)   Wt 164 lb (74.4 kg)   SpO2 96%   BMI 29.05 kg/m   GEN: Pleasant, interactive, well-nourished; elderly; in no acute distress. HEENT:  Normocephalic and atraumatic. PERRLA. Sclera white. Nasal turbinates pink, moist and patent bilaterally. No rhinorrhea present. Oropharynx pink and moist, without exudate or edema. No lesions, ulcerations, or postnasal drip.  NECK:  Supple w/ fair ROM. No JVD present. Normal carotid impulses w/o bruits. Thyroid symmetrical with no goiter or nodules palpated. No lymphadenopathy.   CV: RRR, no m/r/g, no peripheral edema. Pulses intact, +2 bilaterally. No cyanosis, pallor or clubbing. PULMONARY:  Unlabored, regular breathing. Clear bilaterally A&P w/o wheezes/rales/rhonchi. No accessory muscle use. No dullness to percussion. GI: BS present and normoactive. Soft, non-tender to palpation. No organomegaly or masses detected. No CVA tenderness. MSK: No erythema, warmth or tenderness. Cap refil <2 sec all extrem. No deformities or joint swelling noted.  Neuro: A/Ox3. No focal deficits noted.   Skin: Warm, no lesions or rashe Psych: Normal affect and behavior. Judgement and thought content appropriate.     Lab  Results:  CBC    Component Value Date/Time   WBC 9.3 01/29/2022 1643   RBC 3.88 01/29/2022 1643   HGB 11.5 (L) 01/29/2022 1643   HCT 35.8 (L) 01/29/2022 1643   PLT 230.0 01/29/2022 1643   MCV 92.3 01/29/2022 1643   MCHC 32.3 01/29/2022 1643   RDW 13.9 01/29/2022 1643   LYMPHSABS 2.6 01/29/2022 1643   MONOABS 1.0 01/29/2022 1643   EOSABS 0.7 01/29/2022 1643   BASOSABS 0.1 01/29/2022 1643    BMET    Component Value Date/Time   NA 140 01/29/2022 1643   K 5.0 01/29/2022 1643   CL 103 01/29/2022 1643   CO2 31 01/29/2022 1643   GLUCOSE 98 01/29/2022 1643   BUN 16 01/29/2022 1643   CREATININE 1.06 01/29/2022 1643   CALCIUM 10.6 (H) 01/29/2022 1643   GFRNONAA 82.17 06/13/2010 7654  GFRAA 109 04/12/2008 0000    BNP No results found for: "BNP"   Imaging:  CT Chest Wo Contrast  Result Date: 04/12/2022 CLINICAL DATA:  1 month follow up pneumonia. Bil pulmonary nodules on prior imaging. Persistent SOB. On antibiotics EXAM: CT CHEST WITHOUT CONTRAST TECHNIQUE: Multidetector CT imaging of the chest was performed following the standard protocol without IV contrast. RADIATION DOSE REDUCTION: This exam was performed according to the departmental dose-optimization program which includes automated exposure control, adjustment of the mA and/or kV according to patient size and/or use of iterative reconstruction technique. COMPARISON:  CT chest 02/11/2022, chest x-ray 02/20/2015, chest x-ray 10/20/2018 FINDINGS: Cardiovascular: Normal heart size. No significant pericardial effusion. The thoracic aorta is normal in caliber. No atherosclerotic plaque of the thoracic aorta. No coronary artery calcifications. Mediastinum/Nodes: No enlarged mediastinal or axillary lymph nodes. Thyroid gland, trachea, and esophagus demonstrate no significant findings. Small to moderate volume hiatal hernia. Lungs/Pleura: Stable slightly more prominent right upper lobe tree-in-bud nodularity. Interval development of left  upper lobe cluster of peribronchovascular pulmonary micronodules (3: 46-48). Stable left lower lobe peribronchovascular 1.2 cm pulmonary nodule. Interval decrease in size of a left lower lobe subpleural 1.6 x 1 cm (from 2.1 x 1.3 cm) opacity. Interval development of right lower lobe base patchy airspace opacity and linear atelectasis. No pulmonary mass. No pleural effusion. No pneumothorax. Diffuse mild bronchial wall thickening. Upper Abdomen: No acute abnormality. Musculoskeletal: No chest wall abnormality. No suspicious lytic or blastic osseous lesions. No acute displaced fracture. Multilevel degenerative changes of the spine. IMPRESSION: 1. Stable left lower lobe peribronchovascular 1.2 cm pulmonary nodule. Additional imaging evaluation or consultation with Pulmonology or Thoracic Surgery recommended. 2. Stable slightly more prominent right upper lobe tree-in-bud nodularity. Interval development of left upper lobe cluster of peribronchovascular pulmonary micronodules. Findings suggestive of atypical infection. 3. Interval decrease in size of a left lower lobe subpleural 1.6 x 1 cm (from 2.1 x 1.3 cm) opacity. Interval development of right lower lobe base patchy airspace opacity and linear atelectasis. Findings could represent a combination of atelectasis and/or infection/inflammation. 4. Small to moderate volume hiatal hernia. Electronically Signed   By: Iven Finn M.D.   On: 04/12/2022 20:56         Latest Ref Rng & Units 03/21/2015    9:36 AM  PFT Results  FVC-Pre L 1.92   FVC-Predicted Pre % 67   FVC-Post L 2.18   FVC-Predicted Post % 76   Pre FEV1/FVC % % 67   Post FEV1/FCV % % 66   FEV1-Pre L 1.29   FEV1-Predicted Pre % 60   FEV1-Post L 1.43   DLCO uncorrected ml/min/mmHg 12.06   DLCO UNC% % 52   DLVA Predicted % 93   TLC L 5.56   TLC % Predicted % 113   RV % Predicted % 178     No results found for: "NITRICOXIDE"      Assessment & Plan:   CAP (community acquired  pneumonia) Interval decrease in previously identified consolidation.  She is clinically improved and back to baseline.  There were scattered areas of tree-in-bud nodularity, which can be seen with chronic atypical infections.  She also had interval development of a right lower lobe base patchy airspace opacity, which is likely atelectasis given her lack of infectious symptoms.  She looked great upon exam today and lungs were clear.  Advised them to monitor and if she develops any new cough, fatigue, increased shortness of breath or other new symptoms to  notify us.  Patient Instructions  Continue Trelegy 1 puff daily. Brush tongue and rinse mouth afterwards Continue Albuterol inhaler 2 puffs or 3 mL neb every 6 hours as needed for shortness of breath or wheezing. Notify if symptoms persist despite rescue inhaler/neb use. Continue singulair 10 mg At bedtime    Follow up in 3 months with Dr. Halford Chessman. If symptoms do not improve or worsen, please contact office for sooner follow up or seek emergency care.    COPD with asthma ([place]) Appears compensated on current regimen.  Continue triple therapy with Trelegy and as needed albuterol.  Continue Singulair for trigger prevention.  Abnormal CT of the chest She has tree-in-bud nodularity in the right upper lobe, left upper lobe cluster of peribronchovascular pulmonary micronodules, and right lower lobe base patchy airspace opacity with linear atelectasis.  There is a stable left lower lobe peribronchovascular 1.2 cm pulmonary nodule and interval decrease in the left lower lobe subpleural opacity.  She currently is doing well and has no infectious symptoms.  Her breathing is overall stable from her baseline.  We discussed possible further work-up; however, would not recommend bronchoscopy at this point given her lack of symptoms, age and frailty.  Will discuss CT results with Dr. Halford Chessman to see if he has any further recommendations.  In the interim, advised that she  continue her inhalers as previously prescribed and activity as tolerated.     I spent 28 minutes of dedicated to the care of this patient on the date of this encounter to include pre-visit review of records, face-to-face time with the patient discussing conditions above, post visit ordering of testing, clinical documentation with the electronic health record, making appropriate referrals as documented, and communicating necessary findings to members of the patients care team.  Clayton Bibles, NP 04/18/2022  Pt aware and understands NP's role.

## 2022-04-18 NOTE — Progress Notes (Signed)
Reviewed and agree with assessment/plan.   Chesley Mires, MD [facility] Pulmonary/Critical Care 04/18/2022, 6:25 PM Pager:  [phone]

## 2022-04-18 NOTE — Assessment & Plan Note (Signed)
Appears compensated on current regimen.  Continue triple therapy with Trelegy and as needed albuterol.  Continue Singulair for trigger prevention.

## 2022-04-18 NOTE — Patient Instructions (Addendum)
Continue Trelegy 1 puff daily. Brush tongue and rinse mouth afterwards Continue Albuterol inhaler 2 puffs or 3 mL neb every 6 hours as needed for shortness of breath or wheezing. Notify if symptoms persist despite rescue inhaler/neb use. Continue singulair 10 mg At bedtime    Follow up in 3 months with Dr. Halford Chessman. If symptoms do not improve or worsen, please contact office for sooner follow up or seek emergency care.

## 2022-04-29 ENCOUNTER — Ambulatory Visit: Payer: Medicare HMO | Admitting: Podiatry

## 2022-05-12 DIAGNOSIS — E785 Hyperlipidemia, unspecified: Secondary | ICD-10-CM | POA: Diagnosis not present

## 2022-05-12 DIAGNOSIS — M810 Age-related osteoporosis without current pathological fracture: Secondary | ICD-10-CM | POA: Diagnosis not present

## 2022-05-12 DIAGNOSIS — J441 Chronic obstructive pulmonary disease with (acute) exacerbation: Secondary | ICD-10-CM | POA: Diagnosis not present

## 2022-06-17 ENCOUNTER — Ambulatory Visit (INDEPENDENT_AMBULATORY_CARE_PROVIDER_SITE_OTHER): Payer: Medicare HMO | Admitting: Podiatry

## 2022-06-17 DIAGNOSIS — M79675 Pain in left toe(s): Secondary | ICD-10-CM | POA: Diagnosis not present

## 2022-06-17 DIAGNOSIS — M79674 Pain in right toe(s): Secondary | ICD-10-CM

## 2022-06-17 DIAGNOSIS — B351 Tinea unguium: Secondary | ICD-10-CM

## 2022-06-19 NOTE — Progress Notes (Signed)
  Subjective:  [name], female    DOB: 1946-05-24,  MRN: [id number]  Chief Complaint  Patient presents with   Nail Problem    Thick painful toenails, 3 month follow up    Diabetes    At risk diabetic foot care    76 y.o. female presents with the above complaint. History confirmed with patient.  Nails are thickened elongated and causing pain and deformed, she is unable to cut them.  Here with her daughter.  Previous debridement has been helpful.  Objective:  Physical Exam: warm, good capillary refill, no trophic changes or ulcerative lesions, normal DP and PT pulses, and normal sensory exam. Left Foot: dystrophic yellowed discolored nail plates with subungual debris Right Foot: dystrophic yellowed discolored nail plates with subungual debris  Assessment:   1. Pain due to onychomycosis of toenails of both feet      Plan:  Patient was evaluated and treated and all questions answered.  Discussed the etiology and treatment options for the condition in detail with the patient. Educated patient on the topical and oral treatment options for mycotic nails. Recommended debridement of the nails today. Sharp and mechanical debridement performed of all painful and mycotic nails today. Nails debrided in length and thickness using a nail nipper to level of comfort. Discussed treatment options including appropriate shoe gear. Follow up as needed for painful nails.    Return in about 3 months (around 09/16/2022) for RFC.

## 2022-06-20 DIAGNOSIS — I700 Atherosclerosis of aorta: Secondary | ICD-10-CM | POA: Diagnosis not present

## 2022-06-20 DIAGNOSIS — D692 Other nonthrombocytopenic purpura: Secondary | ICD-10-CM | POA: Diagnosis not present

## 2022-06-20 DIAGNOSIS — M810 Age-related osteoporosis without current pathological fracture: Secondary | ICD-10-CM | POA: Diagnosis not present

## 2022-06-20 DIAGNOSIS — R2681 Unsteadiness on feet: Secondary | ICD-10-CM | POA: Diagnosis not present

## 2022-06-20 DIAGNOSIS — R600 Localized edema: Secondary | ICD-10-CM | POA: Diagnosis not present

## 2022-06-20 DIAGNOSIS — F0390 Unspecified dementia without behavioral disturbance: Secondary | ICD-10-CM | POA: Diagnosis not present

## 2022-06-20 DIAGNOSIS — E785 Hyperlipidemia, unspecified: Secondary | ICD-10-CM | POA: Diagnosis not present

## 2022-06-20 DIAGNOSIS — L97301 Non-pressure chronic ulcer of unspecified ankle limited to breakdown of skin: Secondary | ICD-10-CM | POA: Diagnosis not present

## 2022-06-20 DIAGNOSIS — J449 Chronic obstructive pulmonary disease, unspecified: Secondary | ICD-10-CM | POA: Diagnosis not present

## 2022-06-26 DIAGNOSIS — J449 Chronic obstructive pulmonary disease, unspecified: Secondary | ICD-10-CM | POA: Diagnosis not present

## 2022-06-26 DIAGNOSIS — F0390 Unspecified dementia without behavioral disturbance: Secondary | ICD-10-CM | POA: Diagnosis not present

## 2022-06-26 DIAGNOSIS — D692 Other nonthrombocytopenic purpura: Secondary | ICD-10-CM | POA: Diagnosis not present

## 2022-06-26 DIAGNOSIS — M810 Age-related osteoporosis without current pathological fracture: Secondary | ICD-10-CM | POA: Diagnosis not present

## 2022-06-26 DIAGNOSIS — R600 Localized edema: Secondary | ICD-10-CM | POA: Diagnosis not present

## 2022-06-26 DIAGNOSIS — R2681 Unsteadiness on feet: Secondary | ICD-10-CM | POA: Diagnosis not present

## 2022-06-26 DIAGNOSIS — I700 Atherosclerosis of aorta: Secondary | ICD-10-CM | POA: Diagnosis not present

## 2022-06-26 DIAGNOSIS — E785 Hyperlipidemia, unspecified: Secondary | ICD-10-CM | POA: Diagnosis not present

## 2022-06-29 DIAGNOSIS — J449 Chronic obstructive pulmonary disease, unspecified: Secondary | ICD-10-CM | POA: Diagnosis not present

## 2022-06-29 DIAGNOSIS — I700 Atherosclerosis of aorta: Secondary | ICD-10-CM | POA: Diagnosis not present

## 2022-06-29 DIAGNOSIS — M8580 Other specified disorders of bone density and structure, unspecified site: Secondary | ICD-10-CM | POA: Diagnosis not present

## 2022-06-29 DIAGNOSIS — G9009 Other idiopathic peripheral autonomic neuropathy: Secondary | ICD-10-CM | POA: Diagnosis not present

## 2022-06-29 DIAGNOSIS — F0390 Unspecified dementia without behavioral disturbance: Secondary | ICD-10-CM | POA: Diagnosis not present

## 2022-06-29 DIAGNOSIS — I252 Old myocardial infarction: Secondary | ICD-10-CM | POA: Diagnosis not present

## 2022-06-29 DIAGNOSIS — K5792 Diverticulitis of intestine, part unspecified, without perforation or abscess without bleeding: Secondary | ICD-10-CM | POA: Diagnosis not present

## 2022-06-29 DIAGNOSIS — D649 Anemia, unspecified: Secondary | ICD-10-CM | POA: Diagnosis not present

## 2022-06-29 DIAGNOSIS — D692 Other nonthrombocytopenic purpura: Secondary | ICD-10-CM | POA: Diagnosis not present

## 2022-07-10 ENCOUNTER — Other Ambulatory Visit: Payer: Self-pay | Admitting: Pulmonary Disease

## 2022-07-11 DIAGNOSIS — E785 Hyperlipidemia, unspecified: Secondary | ICD-10-CM | POA: Diagnosis not present

## 2022-07-11 DIAGNOSIS — R0602 Shortness of breath: Secondary | ICD-10-CM | POA: Diagnosis not present

## 2022-07-25 ENCOUNTER — Encounter: Payer: Self-pay | Admitting: Nurse Practitioner

## 2022-07-25 ENCOUNTER — Ambulatory Visit: Payer: Medicare HMO | Admitting: Nurse Practitioner

## 2022-07-25 VITALS — BP 130/60 | HR 84 | Ht 63.0 in | Wt 166.2 lb

## 2022-07-25 DIAGNOSIS — R600 Localized edema: Secondary | ICD-10-CM

## 2022-07-25 DIAGNOSIS — J4489 Other specified chronic obstructive pulmonary disease: Secondary | ICD-10-CM | POA: Diagnosis not present

## 2022-07-25 DIAGNOSIS — R918 Other nonspecific abnormal finding of lung field: Secondary | ICD-10-CM | POA: Insufficient documentation

## 2022-07-25 MED ORDER — MONTELUKAST SODIUM 10 MG PO TABS: 10.0000 mg | ORAL_TABLET | Freq: Every day | ORAL | 5 refills | Status: DC

## 2022-07-25 NOTE — Assessment & Plan Note (Signed)
Compensated on current regimen. Continue triple therapy with Trelegy and PRN albuterol. She will continue singulair at bedtime for trigger prevention.  Patient Instructions  Continue Trelegy 1 puff daily. Brush tongue and rinse mouth afterwards Continue Albuterol inhaler 2 puffs or 3 mL neb every 6 hours as needed for shortness of breath or wheezing. Notify if symptoms persist despite rescue inhaler/neb use. Continue singulair 10 mg At bedtime    Follow up in 4 months with Dr. Halford Chessman. If symptoms do not improve or worsen, please contact office for sooner follow up or seek emergency care.

## 2022-07-25 NOTE — Progress Notes (Signed)
_0  ID: Susan Collins, female    DOB: Dec 21, 1945, 76 y.o.   MRN: [id number]  Chief Complaint  Patient presents with   Follow-up    Referring provider: Sueanne Margarita, DO  HPI: 76 year old female, former smoker (53 pack years) followed for COPD/emphysema, DOE, lung nodule.  She is a patient Dr. Juanetta Gosling and last seen in office 04/18/2022 by [facility] NP.  Past medical history significant for HLD, neuropathy, diverticulosis, tinnitus, colon polyps, dementia.  TEST/EVENTS:  03/21/2015 PFTs: FVC 76, FEV1 66, ratio 66, TLC 113, DLCOunc 52.  Moderate obstructive airway disease with increased lung volumes and severe diffusion defect 01/29/2022: BNP normal; eosinophils 700 02/11/2022 CT chest with contrast: Atherosclerosis present.  There is no evidence of acute pulmonary embolism.  There are no enlarged nodes.  There is a small to moderate hiatal hernia.  There is mild biapical scarring, similar to previous.  There is diffuse central airway thickening with scattered clustered micronodularity in both lungs, most notable in the right upper and lower lobes.  There is an ill-defined nodule or focal infiltrate in the left lower lobe which measures 2.1 x 1.3 cm, concerning for focal pneumonia.  There is some mild surrounding clustered nodularity.  There is an additional ill-defined nodular density more centrally in the left lower lobe measuring 1.2 cm.  There are probable areas of scarring or atelectasis medially in the right middle lobe and lingula and laterally in the left lower lobe. 04/11/2022 CT chest without contrast: No LAD.  There is stable slightly more prominent right upper lobe tree-in-bud nodularity.  There is interval development of left upper lobe cluster of peribronchovascular pulmonary micronodules.  There is stable left lower lobe peribronchovascular 1.2 cm pulmonary nodule.  There is interval decrease in size of left lower lobe subpleural 1.6 x 1 cm opacity.  There is interval development of right  lower lobe base patchy airspace opacity and linear atelectasis, infectious/inflammatory versus atelectasis or combination.  There is diffuse mild bronchial wall thickening.  01/29/2022: OV with Dr. Halford Chessman.  Reported progressive DOE and lower extremity swelling.  Difficult to put shoes on.  Does use albuterol daily and feels like it helps.  Advised to continue Trelegy and Singulair.  BNP was normal.  CBC with differential showed eosinophils 700.  BMET with stable kidney function.  Started on 20 mg of Lasix for 2 days.  CXR showed a more prominent appearance of right lower lobe nodular lesion-CT chest with contrast was ordered for further assessment.  There was a 2.1 x 1.3 cm infiltrate in the left lower lobe, concerning for recurrent pneumonia.  Sent in prescription for Augmentin.  02/14/2022: OV with Stevie Ertle NP for follow-up after being seen for progressive DOE and lower extremity swelling a few weeks ago.  She then had a CT scan earlier this week with findings concerning for focal pneumonia in the left lung.  She was started on Augmentin course yesterday.  Today, she reports feeling relatively well.  Still has an occasional cough; minimally productive.  Shortness of breath is at baseline for the most part.  Still has some issues with activity tolerance but does feel as though this is relatively stable.  Lower extremity swelling has significantly improved with short Lasix course.  She denies any recent fevers, night sweats, weight loss, anorexia, hemoptysis.  She is eating and drinking well.  She continues on Trelegy daily.  Uses Singulair at bedtime.  Occasional use of albuterol inhaler. Advised to complete augmentin course and plans for follow  up CT in 6 weeks. Hx of significantly elevated eosinophils, which was downtrending but will need to be monitored in conjunction with her symptoms.  03/07/2022: OV with Madi Bonfiglio NP for follow-up after being treated for pneumonia of the left lung.  Reports feeling well and feels like  cough has resolved.  Shortness of breath is at her baseline.  She and her daughter feel like she has recovered well without any difficulties.  Her lower extremity edema is stable.  She denies any recent fevers, night sweats, weight loss, anorexia, hemoptysis.  She continues on Trelegy daily and uses Singulair at bedtime.  Uses albuterol occasionally and needs a refill for this.  Plans for repeat CT 6/30 to ensure resolution/improvement.  04/18/2022: OV with Lesli Issa NP for follow-up after repeat CT scan.  Her daughter helps to provide history.  They report that she has been doing well since we saw her last.  She has not had any issues with cough and her breathing is at her baseline.  She denies any fevers, night sweats, weight loss, anorexia, hemoptysis.  She is eating and drinking well.  She continues on Trelegy daily and uses Singulair at bedtime.  She uses albuterol every now and then. Interval decrease in previously identified consolidation.  She is clinically improved and back to baseline.  There were scattered areas of tree-in-bud nodularity, which can be seen with chronic atypical infections.  She also had interval development of a right lower lobe base patchy airspace opacity, which is likely atelectasis given her lack of infectious symptoms.  She looked great upon exam today and lungs were clear.  Advised them to monitor and if she develops any new cough, fatigue, increased shortness of breath or other new symptoms to notify us.  07/25/2022: Today - follow up Patient presents today for follow up with her daughter. She has been doing well since we saw her last. Breathing is normal for her. Lives a relatively sedentary life. She sleeps on and off throughout the day, which has been normal for her for many years. No increased cough or chest congestion. She has had some increased swelling in her legs which her PCP is managing. She denies any orthopnea, PND, palpitations. No concerns or complaints today from a  respiratory standpoint. She continues on Trelegy daily and takes singulair nightly.    Allergies  Allergen Reactions   Sulfonamide Derivatives     REACTION: headache    Immunization History  Administered Date(s) Administered   Fluad Quad(high Dose 65+) 06/24/2021   Influenza, High Dose Seasonal PF 10/22/2018, 06/02/2019   Influenza,inj,Quad PF,6+ Mos 07/20/2013   Influenza-Unspecified 07/18/2020   Pneumococcal Conjugate-13 08/08/2014   Pneumococcal Polysaccharide-23 07/20/2013, 08/08/2014   Td 04/19/2009, 11/02/2019   Td (Adult) 04/19/2009   Tdap 12/12/2020   Zoster, Live 08/08/2013    Past Medical History:  Diagnosis Date   CHEST PAIN 11/20/2008   COLONIC POLYPS, HX OF 05/24/2007   Diverticular disease    DYSPNEA ON EXERTION 12/19/2008   HYPERCHOLESTEROLEMIA 09/18/2008   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   Peripheral neuropathy    POSTMENOPAUSAL SYNDROME 04/12/2008   TINNITUS, 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    Tobacco History: Social History   Tobacco Use  Smoking Status Former   Packs/day: 1.00   Years: 53.00   Total pack years: 53.00   Types: Cigarettes   Start date: 26   Quit date: 10/13/1996   Years since quitting: 25.7  Smokeless Tobacco Never  Tobacco Comments   smoked 1-1.5 PPD  Counseling given: Not Answered Tobacco comments: smoked 1-1.5 PPD    Outpatient Medications Prior to Visit  Medication Sig Dispense Refill   albuterol (PROVENTIL) (2.5 MG/3ML) 0.083% nebulizer solution TAKE 3 MLS BY NEBULIZATION EVERY 6 (SIX) HOURS AS NEEDED FOR WHEEZING OR SHORTNESS OF BREATH. 90 mL 10   albuterol (VENTOLIN HFA) 108 (90 Base) MCG/ACT inhaler INHALE TWO PUFFS BY MOUTH EVERY 6 HOURS AS NEEDED 18 each 3   ammonium lactate (AMLACTIN) 12 % lotion Apply 1 application. topically as needed for dry skin. 400 g 0   Calcium Citrate-Vitamin D 315-5 MG-MCG TABS take two tablet daily for your bone strength     Cholecalciferol (VITAMIN D3) 1000 units CAPS Take 1  capsule by mouth daily.      Fluticasone-Umeclidin-Vilant (TRELEGY ELLIPTA) 100-62.5-25 MCG/ACT AEPB Inhale 1 puff into the lungs daily. 60 each 5   ibandronate (BONIVA) 150 MG tablet 1 tablet, with full glass of water and stay upright for 30 minutes. Watch for new jaw or hip pain     simvastatin (ZOCOR) 40 MG tablet Take 1 tablet (40 mg total) by mouth daily. 90 tablet 4   montelukast (SINGULAIR) 10 MG tablet TAKE 1 TABLET BY MOUTH AT BEDTIME 30 tablet 0   No facility-administered medications prior to visit.     Review of Systems:   Constitutional: No weight loss or gain, night sweats, fevers, chills, fatigue, or lassitude. HEENT: No headaches, difficulty swallowing, tooth/dental problems, or sore throat. No sneezing, itching, ear ache, nasal congestion, or post nasal drip CV:  +swelling in lower extremities (stable). No chest pain, orthopnea, PND, anasarca, dizziness, palpitations, syncope Resp: +shortness of breath with exertion (baseline). No cough. No hemoptysis. No wheezing.  No chest wall deformity GI:  No heartburn, indigestion, abdominal pain, nausea, vomiting, diarrhea, change in bowel habits, loss of appetite, bloody stools.  Skin: No rash, lesions, ulcerations MSK:  No joint pain or swelling.  No decreased range of motion.  No back pain. Neuro: No dizziness or lightheadedness.  Psych: No depression or anxiety. Mood stable.     Physical Exam:  BP 130/60 (BP Location: Right Arm, Cuff Size: Normal)   Pulse 84   Ht _0  (1.6 m)   Wt 166 lb 3.2 oz (75.4 kg)   SpO2 96%   BMI 29.44 kg/m   GEN: Pleasant, interactive, well-nourished; elderly; in no acute distress. HEENT:  Normocephalic and atraumatic. PERRLA. Sclera white. Nasal turbinates pink, moist and patent bilaterally. No rhinorrhea present. Oropharynx pink and moist, without exudate or edema. No lesions, ulcerations, or postnasal drip.  NECK:  Supple w/ fair ROM. No JVD present. Normal carotid impulses w/o bruits.  Thyroid symmetrical with no goiter or nodules palpated. No lymphadenopathy.   CV: RRR, no m/r/g, no peripheral edema. Pulses intact, +2 bilaterally. No cyanosis, pallor or clubbing. PULMONARY:  Unlabored, regular breathing. Clear bilaterally A&P w/o wheezes/rales/rhonchi. No accessory muscle use. No dullness to percussion. GI: BS present and normoactive. Soft, non-tender to palpation. No organomegaly or masses detected.  MSK: No erythema, warmth or tenderness. No deformities or joint swelling noted.  Neuro: A/Ox3. No focal deficits noted.   Skin: Warm, no lesions or rashe Psych: Normal affect and behavior. Judgement and thought content appropriate.     Lab Results:  CBC    Component Value Date/Time   WBC 9.3 01/29/2022 1643   RBC 3.88 01/29/2022 1643   HGB 11.5 (L) 01/29/2022 1643   HCT 35.8 (L) 01/29/2022 1643   PLT 230.0 01/29/2022  1643   MCV 92.3 01/29/2022 1643   MCHC 32.3 01/29/2022 1643   RDW 13.9 01/29/2022 1643   LYMPHSABS 2.6 01/29/2022 1643   MONOABS 1.0 01/29/2022 1643   EOSABS 0.7 01/29/2022 1643   BASOSABS 0.1 01/29/2022 1643    BMET    Component Value Date/Time   NA 140 01/29/2022 1643   K 5.0 01/29/2022 1643   CL 103 01/29/2022 1643   CO2 31 01/29/2022 1643   GLUCOSE 98 01/29/2022 1643   BUN 16 01/29/2022 1643   CREATININE 1.06 01/29/2022 1643   CALCIUM 10.6 (H) 01/29/2022 1643   GFRNONAA 82.17 06/13/2010 0934   GFRAA 109 04/12/2008 0000    BNP No results found for: "BNP"   Imaging:  No results found.       Latest Ref Rng & Units 03/21/2015    9:36 AM  PFT Results  FVC-Pre L 1.92   FVC-Predicted Pre % 67   FVC-Post L 2.18   FVC-Predicted Post % 76   Pre FEV1/FVC % % 67   Post FEV1/FCV % % 66   FEV1-Pre L 1.29   FEV1-Predicted Pre % 60   FEV1-Post L 1.43   DLCO uncorrected ml/min/mmHg 12.06   DLCO UNC% % 52   DLVA Predicted % 93   TLC L 5.56   TLC % Predicted % 113   RV % Predicted % 178     No results found for:  "NITRICOXIDE"      Assessment & Plan:   COPD with asthma ([place]) Compensated on current regimen. Continue triple therapy with Trelegy and PRN albuterol. She will continue singulair at bedtime for trigger prevention.  Patient Instructions  Continue Trelegy 1 puff daily. Brush tongue and rinse mouth afterwards Continue Albuterol inhaler 2 puffs or 3 mL neb every 6 hours as needed for shortness of breath or wheezing. Notify if symptoms persist despite rescue inhaler/neb use. Continue singulair 10 mg At bedtime    Follow up in 4 months with Dr. Halford Chessman. If symptoms do not improve or worsen, please contact office for sooner follow up or seek emergency care.    Lung nodules Treated in May 2023 for CAP. Follow up CT 04/11/2022 with increased tree-in-bud nodularity in the right upper lobe, left upper lobe cluster of peribronchovascular pulmonary micronodules, and right lower lobe base patchy airspace opacity with linear atelectasis.  There was a stable left lower lobe peribronchovascular 1.2 cm pulmonary nodule and interval decrease in the left lower lobe subpleural opacity. Findings felt to be related to chronic atypical infection, such as MAC, and atelectasis as she had clinically improved. We discussed that she would not be a good candidate for MAI treatment, if this were what we were dealing with, with her age, frailty, and underlying health conditions. Shared decision to not undergo further workup unless she were to develop worsening respiratory symptoms.    I spent 28 minutes of dedicated to the care of this patient on the date of this encounter to include pre-visit review of records, face-to-face time with the patient discussing conditions above, post visit ordering of testing, clinical documentation with the electronic health record, making appropriate referrals as documented, and communicating necessary findings to members of the patients care team.  Clayton Bibles, NP 07/25/2022  Pt aware  and understands NP's role.

## 2022-07-25 NOTE — Assessment & Plan Note (Signed)
Treated in May 2023 for CAP. Follow up CT 04/11/2022 with increased tree-in-bud nodularity in the right upper lobe, left upper lobe cluster of peribronchovascular pulmonary micronodules, and right lower lobe base patchy airspace opacity with linear atelectasis.  There was a stable left lower lobe peribronchovascular 1.2 cm pulmonary nodule and interval decrease in the left lower lobe subpleural opacity. Findings felt to be related to chronic atypical infection, such as MAC, and atelectasis as she had clinically improved. We discussed that she would not be a good candidate for MAI treatment, if this were what we were dealing with, with her age, frailty, and underlying health conditions. Shared decision to not undergo further workup unless she were to develop worsening respiratory symptoms.

## 2022-07-25 NOTE — Patient Instructions (Addendum)
Continue Trelegy 1 puff daily. Brush tongue and rinse mouth afterwards Continue Albuterol inhaler 2 puffs or 3 mL neb every 6 hours as needed for shortness of breath or wheezing. Notify if symptoms persist despite rescue inhaler/neb use. Continue singulair 10 mg At bedtime    Follow up in 4 months with Dr. Halford Chessman. If symptoms do not improve or worsen, please contact office for sooner follow up or seek emergency care.

## 2022-07-30 NOTE — Progress Notes (Signed)
Reviewed and agree with assessment/plan.   Chesley Mires, MD [facility] Pulmonary/Critical Care 07/30/2022, 9:19 AM Pager:  [phone]

## 2022-08-22 DIAGNOSIS — R600 Localized edema: Secondary | ICD-10-CM | POA: Diagnosis not present

## 2022-08-22 DIAGNOSIS — R7989 Other specified abnormal findings of blood chemistry: Secondary | ICD-10-CM | POA: Diagnosis not present

## 2022-08-22 DIAGNOSIS — D649 Anemia, unspecified: Secondary | ICD-10-CM | POA: Diagnosis not present

## 2022-08-22 DIAGNOSIS — M810 Age-related osteoporosis without current pathological fracture: Secondary | ICD-10-CM | POA: Diagnosis not present

## 2022-08-22 DIAGNOSIS — R2681 Unsteadiness on feet: Secondary | ICD-10-CM | POA: Diagnosis not present

## 2022-08-22 DIAGNOSIS — L97301 Non-pressure chronic ulcer of unspecified ankle limited to breakdown of skin: Secondary | ICD-10-CM | POA: Diagnosis not present

## 2022-08-22 DIAGNOSIS — J449 Chronic obstructive pulmonary disease, unspecified: Secondary | ICD-10-CM | POA: Diagnosis not present

## 2022-08-22 DIAGNOSIS — E785 Hyperlipidemia, unspecified: Secondary | ICD-10-CM | POA: Diagnosis not present

## 2022-08-22 DIAGNOSIS — D692 Other nonthrombocytopenic purpura: Secondary | ICD-10-CM | POA: Diagnosis not present

## 2022-08-22 DIAGNOSIS — F0390 Unspecified dementia without behavioral disturbance: Secondary | ICD-10-CM | POA: Diagnosis not present

## 2022-08-22 DIAGNOSIS — I700 Atherosclerosis of aorta: Secondary | ICD-10-CM | POA: Diagnosis not present

## 2022-08-28 DIAGNOSIS — D649 Anemia, unspecified: Secondary | ICD-10-CM | POA: Diagnosis not present

## 2022-08-28 DIAGNOSIS — D692 Other nonthrombocytopenic purpura: Secondary | ICD-10-CM | POA: Diagnosis not present

## 2022-08-28 DIAGNOSIS — M810 Age-related osteoporosis without current pathological fracture: Secondary | ICD-10-CM | POA: Diagnosis not present

## 2022-08-28 DIAGNOSIS — G9009 Other idiopathic peripheral autonomic neuropathy: Secondary | ICD-10-CM | POA: Diagnosis not present

## 2022-08-28 DIAGNOSIS — F0390 Unspecified dementia without behavioral disturbance: Secondary | ICD-10-CM | POA: Diagnosis not present

## 2022-08-28 DIAGNOSIS — I700 Atherosclerosis of aorta: Secondary | ICD-10-CM | POA: Diagnosis not present

## 2022-08-28 DIAGNOSIS — E785 Hyperlipidemia, unspecified: Secondary | ICD-10-CM | POA: Diagnosis not present

## 2022-08-28 DIAGNOSIS — J4489 Other specified chronic obstructive pulmonary disease: Secondary | ICD-10-CM | POA: Diagnosis not present

## 2022-08-28 DIAGNOSIS — I10 Essential (primary) hypertension: Secondary | ICD-10-CM | POA: Diagnosis not present

## 2022-09-02 DIAGNOSIS — Z87891 Personal history of nicotine dependence: Secondary | ICD-10-CM | POA: Diagnosis not present

## 2022-09-02 DIAGNOSIS — I700 Atherosclerosis of aorta: Secondary | ICD-10-CM | POA: Diagnosis not present

## 2022-09-02 DIAGNOSIS — F0390 Unspecified dementia without behavioral disturbance: Secondary | ICD-10-CM | POA: Diagnosis not present

## 2022-09-02 DIAGNOSIS — D692 Other nonthrombocytopenic purpura: Secondary | ICD-10-CM | POA: Diagnosis not present

## 2022-09-02 DIAGNOSIS — I83003 Varicose veins of unspecified lower extremity with ulcer of ankle: Secondary | ICD-10-CM | POA: Diagnosis not present

## 2022-09-02 DIAGNOSIS — J449 Chronic obstructive pulmonary disease, unspecified: Secondary | ICD-10-CM | POA: Diagnosis not present

## 2022-09-02 DIAGNOSIS — R600 Localized edema: Secondary | ICD-10-CM | POA: Diagnosis not present

## 2022-09-02 DIAGNOSIS — R2681 Unsteadiness on feet: Secondary | ICD-10-CM | POA: Diagnosis not present

## 2022-09-02 DIAGNOSIS — M810 Age-related osteoporosis without current pathological fracture: Secondary | ICD-10-CM | POA: Diagnosis not present

## 2022-09-17 ENCOUNTER — Other Ambulatory Visit: Payer: Self-pay | Admitting: Internal Medicine

## 2022-09-17 DIAGNOSIS — R600 Localized edema: Secondary | ICD-10-CM

## 2022-09-18 ENCOUNTER — Ambulatory Visit: Payer: Medicare HMO | Admitting: Podiatry

## 2022-09-25 ENCOUNTER — Ambulatory Visit: Payer: Medicare HMO | Admitting: Podiatry

## 2022-09-26 ENCOUNTER — Ambulatory Visit: Payer: Medicare HMO

## 2022-09-26 DIAGNOSIS — R600 Localized edema: Secondary | ICD-10-CM

## 2022-10-09 ENCOUNTER — Ambulatory Visit (INDEPENDENT_AMBULATORY_CARE_PROVIDER_SITE_OTHER): Payer: Medicare HMO | Admitting: Podiatry

## 2022-10-09 DIAGNOSIS — Z91199 Patient's noncompliance with other medical treatment and regimen due to unspecified reason: Secondary | ICD-10-CM

## 2022-10-10 DIAGNOSIS — R600 Localized edema: Secondary | ICD-10-CM | POA: Diagnosis not present

## 2022-10-10 DIAGNOSIS — F0390 Unspecified dementia without behavioral disturbance: Secondary | ICD-10-CM | POA: Diagnosis not present

## 2022-10-10 DIAGNOSIS — J9601 Acute respiratory failure with hypoxia: Secondary | ICD-10-CM | POA: Diagnosis not present

## 2022-10-10 DIAGNOSIS — I700 Atherosclerosis of aorta: Secondary | ICD-10-CM | POA: Diagnosis not present

## 2022-10-10 DIAGNOSIS — M810 Age-related osteoporosis without current pathological fracture: Secondary | ICD-10-CM | POA: Diagnosis not present

## 2022-10-10 DIAGNOSIS — J441 Chronic obstructive pulmonary disease with (acute) exacerbation: Secondary | ICD-10-CM | POA: Diagnosis not present

## 2022-10-10 DIAGNOSIS — R2681 Unsteadiness on feet: Secondary | ICD-10-CM | POA: Diagnosis not present

## 2022-10-10 DIAGNOSIS — J449 Chronic obstructive pulmonary disease, unspecified: Secondary | ICD-10-CM | POA: Diagnosis not present

## 2022-10-10 DIAGNOSIS — D692 Other nonthrombocytopenic purpura: Secondary | ICD-10-CM | POA: Diagnosis not present

## 2022-10-11 NOTE — Progress Notes (Signed)
Patient was no-show for appointment today 

## 2022-10-17 DIAGNOSIS — F0390 Unspecified dementia without behavioral disturbance: Secondary | ICD-10-CM | POA: Diagnosis not present

## 2022-10-17 DIAGNOSIS — G9009 Other idiopathic peripheral autonomic neuropathy: Secondary | ICD-10-CM | POA: Diagnosis not present

## 2022-10-17 DIAGNOSIS — I700 Atherosclerosis of aorta: Secondary | ICD-10-CM | POA: Diagnosis not present

## 2022-10-17 DIAGNOSIS — D692 Other nonthrombocytopenic purpura: Secondary | ICD-10-CM | POA: Diagnosis not present

## 2022-10-17 DIAGNOSIS — R600 Localized edema: Secondary | ICD-10-CM | POA: Diagnosis not present

## 2022-10-17 DIAGNOSIS — M810 Age-related osteoporosis without current pathological fracture: Secondary | ICD-10-CM | POA: Diagnosis not present

## 2022-10-17 DIAGNOSIS — J9601 Acute respiratory failure with hypoxia: Secondary | ICD-10-CM | POA: Diagnosis not present

## 2022-10-17 DIAGNOSIS — R2681 Unsteadiness on feet: Secondary | ICD-10-CM | POA: Diagnosis not present

## 2022-10-17 DIAGNOSIS — J441 Chronic obstructive pulmonary disease with (acute) exacerbation: Secondary | ICD-10-CM | POA: Diagnosis not present

## 2022-11-03 DIAGNOSIS — F0390 Unspecified dementia without behavioral disturbance: Secondary | ICD-10-CM | POA: Diagnosis not present

## 2022-11-03 DIAGNOSIS — Z111 Encounter for screening for respiratory tuberculosis: Secondary | ICD-10-CM | POA: Diagnosis not present

## 2022-11-03 DIAGNOSIS — E785 Hyperlipidemia, unspecified: Secondary | ICD-10-CM | POA: Diagnosis not present

## 2022-11-03 DIAGNOSIS — I872 Venous insufficiency (chronic) (peripheral): Secondary | ICD-10-CM | POA: Diagnosis not present

## 2022-11-03 DIAGNOSIS — R600 Localized edema: Secondary | ICD-10-CM | POA: Diagnosis not present

## 2022-11-03 DIAGNOSIS — I83003 Varicose veins of unspecified lower extremity with ulcer of ankle: Secondary | ICD-10-CM | POA: Diagnosis not present

## 2022-11-03 DIAGNOSIS — R2681 Unsteadiness on feet: Secondary | ICD-10-CM | POA: Diagnosis not present

## 2022-11-03 DIAGNOSIS — J449 Chronic obstructive pulmonary disease, unspecified: Secondary | ICD-10-CM | POA: Diagnosis not present

## 2022-11-03 DIAGNOSIS — M810 Age-related osteoporosis without current pathological fracture: Secondary | ICD-10-CM | POA: Diagnosis not present

## 2022-11-03 DIAGNOSIS — Z029 Encounter for administrative examinations, unspecified: Secondary | ICD-10-CM | POA: Diagnosis not present

## 2022-11-17 DIAGNOSIS — R609 Edema, unspecified: Secondary | ICD-10-CM | POA: Diagnosis not present

## 2022-11-17 DIAGNOSIS — J449 Chronic obstructive pulmonary disease, unspecified: Secondary | ICD-10-CM | POA: Diagnosis not present

## 2022-11-17 DIAGNOSIS — E785 Hyperlipidemia, unspecified: Secondary | ICD-10-CM | POA: Diagnosis not present

## 2022-11-17 DIAGNOSIS — R2681 Unsteadiness on feet: Secondary | ICD-10-CM | POA: Diagnosis not present

## 2022-11-17 DIAGNOSIS — R32 Unspecified urinary incontinence: Secondary | ICD-10-CM | POA: Diagnosis not present

## 2022-11-27 DIAGNOSIS — Z79899 Other long term (current) drug therapy: Secondary | ICD-10-CM | POA: Diagnosis not present

## 2022-11-29 ENCOUNTER — Other Ambulatory Visit: Payer: Self-pay

## 2022-11-29 ENCOUNTER — Inpatient Hospital Stay (HOSPITAL_COMMUNITY)
Admission: EM | Admit: 2022-11-29 | Discharge: 2022-12-02 | DRG: 871 | Disposition: A | Discharge status: Home Health | Payer: Medicare HMO | Source: Skilled Nursing Facility | Attending: Family Medicine | Admitting: Family Medicine

## 2022-11-29 ENCOUNTER — Emergency Department (HOSPITAL_COMMUNITY): Payer: Medicare HMO

## 2022-11-29 ENCOUNTER — Encounter (HOSPITAL_COMMUNITY): Payer: Self-pay

## 2022-11-29 DIAGNOSIS — Z66 Do not resuscitate: Secondary | ICD-10-CM | POA: Diagnosis present

## 2022-11-29 DIAGNOSIS — R6520 Severe sepsis without septic shock: Secondary | ICD-10-CM | POA: Diagnosis present

## 2022-11-29 DIAGNOSIS — F0390 Unspecified dementia without behavioral disturbance: Secondary | ICD-10-CM | POA: Diagnosis present

## 2022-11-29 DIAGNOSIS — G629 Polyneuropathy, unspecified: Secondary | ICD-10-CM | POA: Diagnosis present

## 2022-11-29 DIAGNOSIS — Z90710 Acquired absence of both cervix and uterus: Secondary | ICD-10-CM

## 2022-11-29 DIAGNOSIS — R918 Other nonspecific abnormal finding of lung field: Secondary | ICD-10-CM | POA: Diagnosis not present

## 2022-11-29 DIAGNOSIS — Z801 Family history of malignant neoplasm of trachea, bronchus and lung: Secondary | ICD-10-CM | POA: Diagnosis not present

## 2022-11-29 DIAGNOSIS — Z86010 Personal history of colonic polyps: Secondary | ICD-10-CM

## 2022-11-29 DIAGNOSIS — J441 Chronic obstructive pulmonary disease with (acute) exacerbation: Secondary | ICD-10-CM

## 2022-11-29 DIAGNOSIS — Z79899 Other long term (current) drug therapy: Secondary | ICD-10-CM

## 2022-11-29 DIAGNOSIS — Z882 Allergy status to sulfonamides status: Secondary | ICD-10-CM

## 2022-11-29 DIAGNOSIS — E7800 Pure hypercholesterolemia, unspecified: Secondary | ICD-10-CM | POA: Diagnosis present

## 2022-11-29 DIAGNOSIS — I878 Other specified disorders of veins: Secondary | ICD-10-CM | POA: Diagnosis present

## 2022-11-29 DIAGNOSIS — A419 Sepsis, unspecified organism: Secondary | ICD-10-CM

## 2022-11-29 DIAGNOSIS — J440 Chronic obstructive pulmonary disease with acute lower respiratory infection: Secondary | ICD-10-CM | POA: Diagnosis present

## 2022-11-29 DIAGNOSIS — Z8052 Family history of malignant neoplasm of bladder: Secondary | ICD-10-CM | POA: Diagnosis not present

## 2022-11-29 DIAGNOSIS — R0682 Tachypnea, not elsewhere classified: Secondary | ICD-10-CM | POA: Diagnosis not present

## 2022-11-29 DIAGNOSIS — Z7951 Long term (current) use of inhaled steroids: Secondary | ICD-10-CM

## 2022-11-29 DIAGNOSIS — A4189 Other specified sepsis: Principal | ICD-10-CM | POA: Diagnosis present

## 2022-11-29 DIAGNOSIS — J9601 Acute respiratory failure with hypoxia: Secondary | ICD-10-CM | POA: Diagnosis present

## 2022-11-29 DIAGNOSIS — Z9181 History of falling: Secondary | ICD-10-CM

## 2022-11-29 DIAGNOSIS — Z1152 Encounter for screening for COVID-19: Secondary | ICD-10-CM

## 2022-11-29 DIAGNOSIS — Z825 Family history of asthma and other chronic lower respiratory diseases: Secondary | ICD-10-CM | POA: Diagnosis not present

## 2022-11-29 DIAGNOSIS — Z7983 Long term (current) use of bisphosphonates: Secondary | ICD-10-CM

## 2022-11-29 DIAGNOSIS — Z87891 Personal history of nicotine dependence: Secondary | ICD-10-CM

## 2022-11-29 DIAGNOSIS — E785 Hyperlipidemia, unspecified: Secondary | ICD-10-CM | POA: Diagnosis present

## 2022-11-29 DIAGNOSIS — N179 Acute kidney failure, unspecified: Secondary | ICD-10-CM | POA: Diagnosis present

## 2022-11-29 DIAGNOSIS — J189 Pneumonia, unspecified organism: Secondary | ICD-10-CM | POA: Diagnosis present

## 2022-11-29 DIAGNOSIS — D649 Anemia, unspecified: Secondary | ICD-10-CM | POA: Diagnosis present

## 2022-11-29 DIAGNOSIS — R296 Repeated falls: Secondary | ICD-10-CM | POA: Diagnosis present

## 2022-11-29 DIAGNOSIS — Z8249 Family history of ischemic heart disease and other diseases of the circulatory system: Secondary | ICD-10-CM

## 2022-11-29 DIAGNOSIS — J168 Pneumonia due to other specified infectious organisms: Secondary | ICD-10-CM | POA: Diagnosis not present

## 2022-11-29 DIAGNOSIS — R059 Cough, unspecified: Secondary | ICD-10-CM | POA: Diagnosis not present

## 2022-11-29 LAB — COMPREHENSIVE METABOLIC PANEL
ALT: 25 U/L (ref 0–44)
AST: 38 U/L (ref 15–41)
Albumin: 2.5 g/dL — ABNORMAL LOW (ref 3.5–5.0)
Alkaline Phosphatase: 101 U/L (ref 38–126)
Anion gap: 12 (ref 5–15)
BUN: 23 mg/dL (ref 8–23)
CO2: 22 mmol/L (ref 22–32)
Calcium: 8.9 mg/dL (ref 8.9–10.3)
Chloride: 99 mmol/L (ref 98–111)
Creatinine, Ser: 1.44 mg/dL — ABNORMAL HIGH (ref 0.44–1.00)
GFR, Estimated: 37 mL/min — ABNORMAL LOW (ref >60)
Glucose, Bld: 188 mg/dL — ABNORMAL HIGH (ref 70–99)
Potassium: 3.6 mmol/L (ref 3.5–5.1)
Sodium: 133 mmol/L — ABNORMAL LOW (ref 135–145)
Total Bilirubin: 0.7 mg/dL (ref 0.3–1.2)
Total Protein: 6.9 g/dL (ref 6.5–8.1)

## 2022-11-29 LAB — I-STAT VENOUS BLOOD GAS, ED
Acid-Base Excess: 4 mmol/L — ABNORMAL HIGH (ref 0.0–2.0)
Bicarbonate: 27.4 mmol/L (ref 20.0–28.0)
Calcium, Ion: 1.18 mmol/L (ref 1.15–1.40)
HCT: 27 % — ABNORMAL LOW (ref 36.0–46.0)
Hemoglobin: 9.2 g/dL — ABNORMAL LOW (ref 12.0–15.0)
O2 Saturation: 86 %
Potassium: 3.8 mmol/L (ref 3.5–5.1)
Sodium: 135 mmol/L (ref 135–145)
TCO2: 28 mmol/L (ref 22–32)
pCO2, Ven: 37 mmHg — ABNORMAL LOW (ref 44–60)
pH, Ven: 7.477 — ABNORMAL HIGH (ref 7.25–7.43)
pO2, Ven: 47 mmHg — ABNORMAL HIGH (ref 32–45)

## 2022-11-29 LAB — CBC WITH DIFFERENTIAL/PLATELET
Abs Immature Granulocytes: 0.05 10*3/uL (ref 0.00–0.07)
Basophils Absolute: 0 10*3/uL (ref 0.0–0.1)
Basophils Relative: 0 %
Eosinophils Absolute: 0 10*3/uL (ref 0.0–0.5)
Eosinophils Relative: 0 %
HCT: 30.7 % — ABNORMAL LOW (ref 36.0–46.0)
Hemoglobin: 10 g/dL — ABNORMAL LOW (ref 12.0–15.0)
Immature Granulocytes: 0 %
Lymphocytes Relative: 4 %
Lymphs Abs: 0.9 10*3/uL (ref 0.7–4.0)
MCH: 29.7 pg (ref 26.0–34.0)
MCHC: 32.6 g/dL (ref 30.0–36.0)
MCV: 91.1 fL (ref 80.0–100.0)
Monocytes Absolute: 1.9 10*3/uL — ABNORMAL HIGH (ref 0.1–1.0)
Monocytes Relative: 8 %
Neutro Abs: 19.5 10*3/uL — ABNORMAL HIGH (ref 1.7–7.7)
Neutrophils Relative %: 88 %
Platelets: 267 10*3/uL (ref 150–400)
RBC: 3.37 MIL/uL — ABNORMAL LOW (ref 3.87–5.11)
RDW: 14 % (ref 11.5–15.5)
WBC: 22.4 10*3/uL — ABNORMAL HIGH (ref 4.0–10.5)
nRBC: 0 % (ref 0.0–0.2)

## 2022-11-29 LAB — LACTIC ACID, PLASMA
Lactic Acid, Venous: 2 mmol/L (ref 0.5–1.9)
Lactic Acid, Venous: 0.9 mmol/L (ref 0.5–1.9)

## 2022-11-29 LAB — RESP PANEL BY RT-PCR (RSV, FLU A&B, COVID)  RVPGX2
Influenza A by PCR: NEGATIVE
Influenza B by PCR: NEGATIVE
Resp Syncytial Virus by PCR: NEGATIVE
SARS Coronavirus 2 by RT PCR: NEGATIVE

## 2022-11-29 LAB — BRAIN NATRIURETIC PEPTIDE: B Natriuretic Peptide: 127.6 pg/mL — ABNORMAL HIGH (ref 0.0–100.0)

## 2022-11-29 LAB — MAGNESIUM: Magnesium: 2.5 mg/dL — ABNORMAL HIGH (ref 1.7–2.4)

## 2022-11-29 MED ORDER — LACTATED RINGERS IV SOLN: INTRAVENOUS | Status: AC

## 2022-11-29 MED ORDER — LACTATED RINGERS IV SOLN: INTRAVENOUS | Status: DC

## 2022-11-29 MED ORDER — SODIUM CHLORIDE 0.9 % IV SOLN
500.0000 mg | INTRAVENOUS | Status: DC
  Administered 2022-11-29: 500 mg via INTRAVENOUS
  Filled 2022-11-29: qty 5

## 2022-11-29 MED ORDER — PREDNISONE 20 MG PO TABS
40.0000 mg | ORAL_TABLET | Freq: Every day | ORAL | Status: DC
  Administered 2022-11-30 – 2022-12-02 (×3): 40 mg via ORAL
  Filled 2022-11-29 (×3): qty 2

## 2022-11-29 MED ORDER — IPRATROPIUM-ALBUTEROL 0.5-2.5 (3) MG/3ML IN SOLN
3.0000 mL | Freq: Four times a day (QID) | RESPIRATORY_TRACT | Status: DC
  Administered 2022-11-30 – 2022-12-01 (×6): 3 mL via RESPIRATORY_TRACT
  Filled 2022-11-29 (×6): qty 3

## 2022-11-29 MED ORDER — ONDANSETRON HCL 4 MG PO TABS: 4.0000 mg | ORAL_TABLET | Freq: Four times a day (QID) | ORAL | Status: DC | PRN

## 2022-11-29 MED ORDER — SODIUM CHLORIDE 0.9 % IV BOLUS (SEPSIS)
1000.0000 mL | Freq: Once | INTRAVENOUS | Status: AC
  Administered 2022-11-29: 1000 mL via INTRAVENOUS

## 2022-11-29 MED ORDER — ACETAMINOPHEN 650 MG RE SUPP: 650.0000 mg | Freq: Four times a day (QID) | RECTAL | Status: DC | PRN

## 2022-11-29 MED ORDER — ONDANSETRON HCL 4 MG/2ML IJ SOLN: 4.0000 mg | Freq: Four times a day (QID) | INTRAMUSCULAR | Status: DC | PRN

## 2022-11-29 MED ORDER — SODIUM CHLORIDE 0.9 % IV SOLN
2.0000 g | INTRAVENOUS | Status: DC
  Administered 2022-11-29 – 2022-12-01 (×3): 2 g via INTRAVENOUS
  Filled 2022-11-29 (×4): qty 20

## 2022-11-29 MED ORDER — HEPARIN SODIUM (PORCINE) 5000 UNIT/ML IJ SOLN
5000.0000 [IU] | Freq: Three times a day (TID) | INTRAMUSCULAR | Status: DC
  Administered 2022-11-30 – 2022-12-02 (×7): 5000 [IU] via SUBCUTANEOUS
  Filled 2022-11-29 (×6): qty 1

## 2022-11-29 MED ORDER — IPRATROPIUM-ALBUTEROL 0.5-2.5 (3) MG/3ML IN SOLN
3.0000 mL | Freq: Once | RESPIRATORY_TRACT | Status: AC
  Administered 2022-11-29: 3 mL via RESPIRATORY_TRACT
  Filled 2022-11-29: qty 3

## 2022-11-29 MED ORDER — METHYLPREDNISOLONE SODIUM SUCC 125 MG IJ SOLR
125.0000 mg | Freq: Once | INTRAMUSCULAR | Status: AC
  Administered 2022-11-29: 125 mg via INTRAVENOUS
  Filled 2022-11-29: qty 2

## 2022-11-29 MED ORDER — ACETAMINOPHEN 325 MG PO TABS
650.0000 mg | ORAL_TABLET | Freq: Four times a day (QID) | ORAL | Status: DC | PRN
  Administered 2022-12-02: 650 mg via ORAL
  Filled 2022-11-29: qty 2

## 2022-11-29 NOTE — Assessment & Plan Note (Addendum)
Likely due to sepsis, RLL and lasix use at ALF. Continue with IVF. Repeat BMP in AM. Hold lasix.

## 2022-11-29 NOTE — Assessment & Plan Note (Signed)
Stable. Chronic. Discussed with pt's son Kathryne Hitch that pt's with dementia in the hospital can get agitated. If pt gets agitated, son aware that pt may need to be physically restrained or given sedatives to help with agitation.

## 2022-11-29 NOTE — Assessment & Plan Note (Signed)
Stable. Continue zocor 40 mg

## 2022-11-29 NOTE — ED Provider Notes (Signed)
[facility] Provider Note   CSN: [id number] Arrival date & time: 11/29/22  1654     History  Chief Complaint  Patient presents with   Fever    Pt present to ED from [facility] with noted fever of 101.2 per the facility. Pt has hx of dementia. Pt denies pain at this time. Pt A&ox4 at this time.    Susan Collins is a 77 y.o. female.  HPI Patient presents for cough and shortness of breath.  Medical history includes HLD, neuropathy, asthma, COPD, anemia, dementia.  She arrives by EMS from skilled nursing facility.  Nursing facility states that she has had symptoms since Tuesday but does not specify which symptoms.  EMS noted fever of 101.2 degrees.  Patient was tachycardic in the range of 105.  She is tachypneic in the range of 30.  She was noted to be hypoxic on room air with SpO2 of 87%.  She does not wear oxygen at baseline.  She was placed on 4 L of supplemental oxygen with normalization of SpO2.  Patient was given 300 cc of fluid prior to arrival.  No other interventions were given.  History from patient is limited by her dementia.  Per review of facility paperwork, patient does get nebulizing treatments as needed.  She is prescribed Lasix daily.  She was apparently started on Keflex today.    Home Medications Prior to Admission medications   Medication Sig Start Date End Date Taking? Authorizing Provider  albuterol (PROVENTIL) (2.5 MG/3ML) 0.083% nebulizer solution TAKE 3 MLS BY NEBULIZATION EVERY 6 (SIX) HOURS AS NEEDED FOR WHEEZING OR SHORTNESS OF BREATH. 12/09/21   Chesley Mires, MD  albuterol (VENTOLIN HFA) 108 (90 Base) MCG/ACT inhaler INHALE TWO PUFFS BY MOUTH EVERY 6 HOURS AS NEEDED 03/07/22   Cobb, Karie Schwalbe, NP  ammonium lactate (AMLACTIN) 12 % lotion Apply 1 application. topically as needed for dry skin. 12/26/21   Criselda Peaches, DPM  Calcium Citrate-Vitamin D 315-5 MG-MCG TABS take two tablet daily for your bone strength  06/02/19   [provider]  Cholecalciferol (VITAMIN D3) 1000 units CAPS Take 1 capsule by mouth daily.     [provider]  Fluticasone-Umeclidin-Vilant (TRELEGY ELLIPTA) 100-62.5-25 MCG/ACT AEPB Inhale 1 puff into the lungs daily. 08/02/21   Chesley Mires, MD  ibandronate (BONIVA) 150 MG tablet 1 tablet, with full glass of water and stay upright for 30 minutes. Watch for new jaw or hip pain    [provider]  montelukast (SINGULAIR) 10 MG tablet Take 1 tablet (10 mg total) by mouth at bedtime. 07/25/22   Cobb, Karie Schwalbe, NP  simvastatin (ZOCOR) 40 MG tablet Take 1 tablet (40 mg total) by mouth daily. 05/20/18   Marletta Lor, MD      Allergies    Sulfonamide derivatives    Review of Systems   Review of Systems  Unable to perform ROS: Dementia    Physical Exam Updated Vital Signs BP (!) 119/51   Pulse 93   Temp 98.4 F (36.9 C) (Oral)   Resp (!) 29   Ht 5' 3"$  (1.6 m)   Wt 75 kg   SpO2 96%   BMI 29.29 kg/m  Physical Exam Vitals and nursing note reviewed.  Constitutional:      General: She is not in acute distress.    Appearance: She is well-developed and normal weight. She is ill-appearing. She is not toxic-appearing or diaphoretic.  HENT:  Head: Normocephalic and atraumatic.     Right Ear: External ear normal.     Left Ear: External ear normal.     Nose: Nose normal.     Mouth/Throat:     Mouth: Mucous membranes are dry.  Eyes:     Extraocular Movements: Extraocular movements intact.     Conjunctiva/sclera: Conjunctivae normal.  Cardiovascular:     Rate and Rhythm: Normal rate and regular rhythm.     Heart sounds: No murmur heard. Pulmonary:     Effort: Pulmonary effort is normal. Tachypnea present. No accessory muscle usage or respiratory distress.     Breath sounds: Examination of the right-upper field reveals rhonchi. Examination of the right-middle field reveals rhonchi. Decreased breath sounds, wheezing and rhonchi present.   Abdominal:     Palpations: Abdomen is soft.     Tenderness: There is no abdominal tenderness.  Musculoskeletal:        General: No swelling.     Cervical back: Normal range of motion and neck supple.  Skin:    General: Skin is warm and dry.     Capillary Refill: Capillary refill takes less than 2 seconds.  Neurological:     Mental Status: She is alert.  Psychiatric:        Mood and Affect: Mood normal.     ED Results / Procedures / Treatments   Labs (all labs ordered are listed, but only abnormal results are displayed) Labs Reviewed  LACTIC ACID, PLASMA - Abnormal; Notable for the following components:      Result Value   Lactic Acid, Venous 2.0 (*)    All other components within normal limits  COMPREHENSIVE METABOLIC PANEL - Abnormal; Notable for the following components:   Sodium 133 (*)    Glucose, Bld 188 (*)    Creatinine, Ser 1.44 (*)    Albumin 2.5 (*)    GFR, Estimated 37 (*)    All other components within normal limits  CBC WITH DIFFERENTIAL/PLATELET - Abnormal; Notable for the following components:   WBC 22.4 (*)    RBC 3.37 (*)    Hemoglobin 10.0 (*)    HCT 30.7 (*)    Neutro Abs 19.5 (*)    Monocytes Absolute 1.9 (*)    All other components within normal limits  MAGNESIUM - Abnormal; Notable for the following components:   Magnesium 2.5 (*)    All other components within normal limits  I-STAT VENOUS BLOOD GAS, ED - Abnormal; Notable for the following components:   pH, Ven 7.477 (*)    pCO2, Ven 37.0 (*)    pO2, Ven 47 (*)    Acid-Base Excess 4.0 (*)    HCT 27.0 (*)    Hemoglobin 9.2 (*)    All other components within normal limits  RESP PANEL BY RT-PCR (RSV, FLU A&B, COVID)  RVPGX2  CULTURE, BLOOD (ROUTINE X 2)  CULTURE, BLOOD (ROUTINE X 2)  LACTIC ACID, PLASMA  BRAIN NATRIURETIC PEPTIDE  URINALYSIS, W/ REFLEX TO CULTURE (INFECTION SUSPECTED)  PROTIME-INR    EKG EKG Interpretation  Date/Time:  Saturday November 29 2022 17:10:23  EST Ventricular Rate:  98 PR Interval:  174 QRS Duration: 98 QT Interval:  347 QTC Calculation: 443 R Axis:   -42 Text Interpretation: Sinus rhythm Left ventricular hypertrophy Anterior Q waves, possibly due to LVH Artifact in lead(s) I II III aVR aVL Confirmed by Godfrey Pick [phone]) on 11/29/2022 6:04:23 PM  Radiology DG Chest Port 1 View  Result Date: 11/29/2022  CLINICAL DATA:  Questionable sepsis. EXAM: PORTABLE CHEST 1 VIEW COMPARISON:  01/29/2022 and older exams. FINDINGS: Cardiac silhouette is normal in size. No mediastinal or hilar masses. Lungs are hyperexpanded. There are interstitial and patchy airspace opacities at the lung bases, right greater than left. Possible small effusions. Mid and upper lungs are clear. No pneumothorax. Skeletal structures are demineralized, grossly intact. IMPRESSION: 1. Interstitial and hazy airspace opacities at the lung bases. Findings may reflect atelectasis, infection or a combination. No convincing pulmonary edema. Electronically Signed   By: Lajean Manes M.D.   On: 11/29/2022 17:31    Procedures Procedures    Medications Ordered in ED Medications  lactated ringers infusion (has no administration in time range)  cefTRIAXone (ROCEPHIN) 2 g in sodium chloride 0.9 % 100 mL IVPB (0 g Intravenous Stopped 11/29/22 1853)  azithromycin (ZITHROMAX) 500 mg in sodium chloride 0.9 % 250 mL IVPB (has no administration in time range)  sodium chloride 0.9 % bolus 1,000 mL (1,000 mLs Intravenous New Bag/Given 11/29/22 1750)  ipratropium-albuterol (DUONEB) 0.5-2.5 (3) MG/3ML nebulizer solution 3 mL (3 mLs Nebulization Given 11/29/22 1755)  methylPREDNISolone sodium succinate (SOLU-MEDROL) 125 mg/2 mL injection 125 mg (125 mg Intravenous Given 11/29/22 1755)    ED Course/ Medical Decision Making/ A&P                             Medical Decision Making Amount and/or Complexity of Data Reviewed Labs: ordered. Radiology: ordered. ECG/medicine tests:  ordered.  Risk Prescription drug management.   This patient presents to the ED for concern of cough and shortness of breath, this involves an extensive number of treatment options, and is a complaint that carries with it a high risk of complications and morbidity.  The differential diagnosis includes pneumonia, COPD exacerbation, URI, CHF   Co morbidities that complicate the patient evaluation  HLD, neuropathy, asthma, COPD, anemia, dementia   Additional history obtained:  Additional history obtained from EMS, patient's son External records from outside source obtained and reviewed including EMR   Lab Tests:  I Ordered, and personally interpreted labs.  The pertinent results include: Cytosis is present.  AKI is present.  Initial lactate is high-normal.   Imaging Studies ordered:  I ordered imaging studies including chest x-ray I independently visualized and interpreted imaging which showed bibasilar airspace opacities, greater on right I agree with the radiologist interpretation   Cardiac Monitoring: / EKG:  The patient was maintained on a cardiac monitor.  I personally viewed and interpreted the cardiac monitored which showed an underlying rhythm of: Sinus rhythm  Problem List / ED Course / Critical interventions / Medication management  Patient presents for shortness of breath and productive cough.  EMS noted a fever, tachycardia, hypoxia, and tachypnea prior to arrival.  She did receive 60 mg of Tylenol during her cc of IVF during transit.  On arrival, patient mains tachypneic.  Heart rate is elevated in the range of 105.  On lung auscultation, she does have diminished breath sounds and wheezing.  Rhonchi are noted on right lung fields.  Diagnostic workup was initiated.  Patient was ordered DuoNeb, Solu-Medrol, and antibiotics.  Chest x-ray shows bibasilar airspace opacities, greater on the right.  Lab work is notable for leukocytosis and AKI.  On reassessment, patient has  improved work of breathing.  Patient is now able to maintain normal SpO2 on 2 L.  Patient was admitted to medicine for further management. I ordered  medication including DuoNeb, Solu-Medrol for COPD; antibiotics for COPD exacerbation/pneumonia; IV fluids for hydration Reevaluation of the patient after these medicines showed that the patient improved I have reviewed the patients home medicines and have made adjustments as needed   Social Determinants of Health:  Resides in skilled nursing facility, has history of dementia  CRITICAL CARE Performed by: Godfrey Pick   Total critical care time: 32 minutes  Critical care time was exclusive of separately billable procedures and treating other patients.  Critical care was necessary to treat or prevent imminent or life-threatening deterioration.  Critical care was time spent personally by me on the following activities: development of treatment plan with patient and/or surrogate as well as nursing, discussions with consultants, evaluation of patient's response to treatment, examination of patient, obtaining history from patient or surrogate, ordering and performing treatments and interventions, ordering and review of laboratory studies, ordering and review of radiographic studies, pulse oximetry and re-evaluation of patient's condition.          Final Clinical Impression(s) / ED Diagnoses Final diagnoses:  Pneumonia of right lung due to infectious organism, unspecified part of lung  Acute respiratory failure with hypoxia [facility])    Rx / DC Orders ED Discharge Orders     None         Godfrey Pick, MD 11/29/22 1940

## 2022-11-29 NOTE — Assessment & Plan Note (Signed)
Pt not on supplemental O2 at ALF. Reported RA sats of 87%. Continue with supplemental O2. Wean as tolerated

## 2022-11-29 NOTE — Assessment & Plan Note (Addendum)
Fulfills sepsis criteria. RLL pneumonia seen on CXR. Hypoxia needing supplemental O2(acute respiratory failure with hypxoia), WBC 22K. Lactic acid 2.0

## 2022-11-29 NOTE — H&P (Addendum)
History and Physical    AUDIE OPRY [id number] DOB: 01/19/1946 DOA: 11/29/2022  DOS: the patient was seen and examined on 11/29/2022  PCP: Sueanne Margarita, DO   Patient coming from: ALF([facility])  I have personally briefly reviewed patient's old medical records in [place]  CC: hypoxia HPI: 77 yo WF with history of COPD, dementia, hyperlipidemia presents to the ER today with reported hypoxia and cough.  Patient unable to give any history review of systems.  History obtained for the patient's son [name].  Patient recently moved into [facility] (assisted living with memory care) at the end of January 2024.  She has been at the facility for about 2 weeks.  She was living at home prior to this.  Reason for admission to assisted living due to worsening dementia and falls at home.  Son states over the last 2 to 3 days, the patient had increasing shortness of breath.  He was called today due to hypoxia.  Reported room air saturations were 87%.  EMS noted patient febrile to 101.2.  On arrival to the ER,, temp 98.4 heart rate 100 blood pressure 114/45 satting 89% on room air.  Labs showed a white count of 22.4, hemotympanums 0, platelets of 267  Venous blood gas pH 7.44 pCO2 39 pO2 of 47  Sodium 133, potassium 3.6, BUN 23, creatinine 1.44  Lactic acid 2.0.  Chest x-ray demonstrated right lower lobe pneumonia.  [facility] hospital[facility] contacted for admission.  Echocardiogram 09/26/2022:  Left ventricle cavity is normal in size and wall thickness. Normal global  wall motion. Normal LV systolic function with EF 66%. Normal diastolic  filling pattern.  No significant valvular abnormality.  Normal right atrial pressure.    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 Her smoking use included cigarettes. She started smoking about 63 years ago. She has a 53.00 pack-year smoking history. She has never used smokeless tobacco. She reports that she does not drink alcohol and does not use drugs.  Allergies  Allergen Reactions   Sulfonamide Derivatives     REACTION: headache    Family History  Problem Relation Age of Onset   Lung cancer Mother        died of lung Ca   COPD Father    Heart disease Father        died of MI   Heart attack Father    COPD Brother    Bladder Cancer Brother     Prior to Admission medications   Medication Sig Start Date End Date Taking? Authorizing Provider  acetaminophen (TYLENOL) 650 MG CR tablet Take 1,300 mg by mouth every 8 (eight) hours as needed for pain. 11/16/22  Yes [provider]  albuterol (  PROVENTIL) (2.5 MG/3ML) 0.083% nebulizer solution TAKE 3 MLS BY NEBULIZATION EVERY 6 (SIX) HOURS AS NEEDED FOR WHEEZING OR SHORTNESS OF BREATH. Patient taking differently: Take 2.5 mg by nebulization every 6 (six) hours as needed for wheezing or shortness of breath. TAKE 3 MLS BY NEBULIZATION EVERY 6 (SIX) HOURS AS NEEDED FOR WHEEZING OR SHORTNESS OF BREATH. 12/09/21  Yes Chesley Mires, MD  Ascorbic Acid (VITAMIN C) 1000 MG tablet Take 1,000 mg by mouth daily. 11/17/22  Yes [provider]  calcium carbonate (OS-CAL - DOSED IN MG OF ELEMENTAL CALCIUM) 1250 (500 Ca) MG tablet Take 1 tablet by mouth daily.   Yes [provider]  furosemide (LASIX) 20 MG tablet Take 40 mg by mouth daily. 11/14/22  Yes [provider]  albuterol (VENTOLIN HFA) 108 (90 Base) MCG/ACT inhaler INHALE TWO PUFFS BY MOUTH EVERY 6 HOURS AS NEEDED 03/07/22   Cobb, Karie Schwalbe, NP  ammonium lactate (AMLACTIN) 12 % lotion Apply 1 application. topically as needed for dry skin. 12/26/21   Criselda Peaches, DPM  Calcium Citrate-Vitamin D 315-5 MG-MCG TABS take two tablet daily for your bone strength 06/02/19   [provider]  cephALEXin (KEFLEX) 500 MG capsule Take 500 mg by mouth 3 (three) times daily. 11/29/22 12/06/22  [provider]  Cholecalciferol (VITAMIN D3) 1000 units CAPS Take 1 capsule by mouth daily.     [provider]  Fluticasone-Umeclidin-Vilant (TRELEGY ELLIPTA) 100-62.5-25 MCG/ACT AEPB Inhale 1 puff into the lungs daily. 08/02/21   Chesley Mires, MD  ibandronate (BONIVA) 150 MG tablet 1 tablet, with full glass of water and stay upright for 30 minutes. Watch for new jaw or hip pain    [provider]  montelukast (SINGULAIR) 10 MG tablet Take 1 tablet (10 mg total) by mouth at bedtime. 07/25/22   Cobb, Karie Schwalbe, NP  simvastatin (ZOCOR) 40 MG tablet Take 1 tablet (40 mg total) by mouth daily. 05/20/18   Marletta Lor, MD    Physical Exam: Vitals:   11/29/22 1840 11/29/22 1845 11/29/22 1900 11/29/22 1945  BP:   (!) 116/40 (!) 108/43  Pulse:   90 87  Resp:   18 16  Temp:      TempSrc:      SpO2: 96%  93% 92%  Weight:  75 kg    Height:  5' 3"$  (1.6 m)      Physical Exam Vitals and nursing note reviewed.  Constitutional:      General: She is not in acute distress.    Appearance: She is not toxic-appearing or diaphoretic.  HENT:     Head: Normocephalic and atraumatic.     Nose: Nose normal.  Cardiovascular:     Rate and Rhythm: Normal rate and regular rhythm.     Pulses: Normal pulses.  Pulmonary:     Breath sounds: Examination of the right-middle field reveals  wheezing. Examination of the left-middle field reveals wheezing. Examination of the right-lower field reveals rales. Wheezing and rales present.  Abdominal:     General: Bowel sounds are normal. There is no distension.     Palpations: Abdomen is soft.     Tenderness: There is no abdominal tenderness.  Musculoskeletal:     Right lower leg: Edema present.     Left lower leg: Edema present.  Skin:    Capillary Refill: Capillary refill takes less than 2 seconds.     Comments: Chronic venous stasis pretibial areas bilaterally.  Son states that pt's  legs are less erythematous today than normal(improved)  Neurological:     General: No focal deficit present.     Mental Status: She is alert. She is disoriented.      Labs on Admission: I have personally reviewed following labs and imaging studies  CBC: Recent Labs  Lab 11/29/22 1708 11/29/22 1812  WBC 22.4*  --   NEUTROABS 19.5*  --   HGB 10.0* 9.2*  HCT 30.7* 27.0*  MCV 91.1  --   PLT 267  --    Basic Metabolic Panel: Recent Labs  Lab 11/29/22 1708 11/29/22 1812  NA 133* 135  K 3.6 3.8  CL 99  --   CO2 22  --   GLUCOSE 188*  --   BUN 23  --   CREATININE 1.44*  --   CALCIUM 8.9  --   MG 2.5*  --    GFR: Estimated Creatinine Clearance: 31.2 mL/min (A) (by C-G formula based on SCr of 1.44 mg/dL (H)). Liver Function Tests: Recent Labs  Lab 11/29/22 1708  AST 38  ALT 25  ALKPHOS 101  BILITOT 0.7  PROT 6.9  ALBUMIN 2.5*   No results for input(s): "LIPASE", "AMYLASE" in the last 168 hours. No results for input(s): "AMMONIA" in the last 168 hours. Coagulation Profile: No results for input(s): "INR", "PROTIME" in the last 168 hours. Cardiac Enzymes: No results for input(s): "CKTOTAL", "CKMB", "CKMBINDEX", "TROPONINI", "TROPONINIHS" in the last 168 hours. BNP (last 3 results) Recent Labs    01/31/22 0900  PROBNP 44.0   HbA1C: No results for input(s): "HGBA1C" in the last 72 hours. CBG: No results for input(s):  "GLUCAP" in the last 168 hours. Lipid Profile: No results for input(s): "CHOL", "HDL", "LDLCALC", "TRIG", "CHOLHDL", "LDLDIRECT" in the last 72 hours. Thyroid Function Tests: No results for input(s): "TSH", "T4TOTAL", "FREET4", "T3FREE", "THYROIDAB" in the last 72 hours. Anemia Panel: No results for input(s): "VITAMINB12", "FOLATE", "FERRITIN", "TIBC", "IRON", "RETICCTPCT" in the last 72 hours. Urine analysis: No results found for: "COLORURINE", "APPEARANCEUR", "LABSPEC", "PHURINE", "GLUCOSEU", "HGBUR", "BILIRUBINUR", "KETONESUR", "PROTEINUR", "UROBILINOGEN", "NITRITE", "LEUKOCYTESUR"  Radiological Exams on Admission: I have personally reviewed images DG Chest Port 1 View  Result Date: 11/29/2022 CLINICAL DATA:  Questionable sepsis. EXAM: PORTABLE CHEST 1 VIEW COMPARISON:  01/29/2022 and older exams. FINDINGS: Cardiac silhouette is normal in size. No mediastinal or hilar masses. Lungs are hyperexpanded. There are interstitial and patchy airspace opacities at the lung bases, right greater than left. Possible small effusions. Mid and upper lungs are clear. No pneumothorax. Skeletal structures are demineralized, grossly intact. IMPRESSION: 1. Interstitial and hazy airspace opacities at the lung bases. Findings may reflect atelectasis, infection or a combination. No convincing pulmonary edema. Electronically Signed   By: Lajean Manes M.D.   On: 11/29/2022 17:31    EKG: My personal interpretation of EKG shows: NSR    Assessment/Plan Principal Problem:   RLL pneumonia Active Problems:   Sepsis with acute organ dysfunction without septic shock (HCC)   Acute respiratory failure with hypoxia (HCC)   COPD with acute exacerbation (HCC)   Normocytic anemia   Hyperlipidemia   Dementia (HCC)   AKI (acute kidney injury) ([place])   DNR (do not resuscitate)    Assessment and Plan: * RLL pneumonia Admit to med/surg. Continue with IV rocephin/zithromax. Continue with pulmonary toileting.  Acute  respiratory failure with hypoxia (HCC) Pt not on supplemental O2 at ALF. Reported RA sats of 87%. Continue with supplemental O2. Wean as tolerated  Sepsis with  acute organ dysfunction without septic shock (HCC) Fulfills sepsis criteria. RLL pneumonia seen on CXR. Hypoxia needing supplemental O2(acute respiratory failure with hypxoia), WBC 22K. Lactic acid 2.0  AKI (acute kidney injury) ([place]) Likely due to sepsis, RLL and lasix use at ALF. Continue with IVF. Repeat BMP in AM. Hold lasix.  Dementia ([place]) Stable. Chronic. Discussed with pt's son Kathryne Hitch that pt's with dementia in the hospital can get agitated. If pt gets agitated, son aware that pt may need to be physically restrained or given sedatives to help with agitation.  Hyperlipidemia Stable. Continue zocor 40 mg  Normocytic anemia Check TIBC, iron studies. Pt does not require PRBC transfusion at this time.  COPD with acute exacerbation ([place]) Likely exacerbated by RLL. Start po prednisone. Start duonebs q6h.  DNR (do not resuscitate) Verified DNR status with son. Reviewed DNR form.      DVT prophylaxis: SQ Heparin Code Status: DNR/DNI(Do NOT Intubate). Verified with son Kathryne Hitch. Reviewed pt's DNR form Family Communication: discussed at bedside with pt and pt's son Kathryne Hitch  Disposition Plan: return to ALF  Consults called: none  Admission status: Observation, Med-Surg   Kristopher Oppenheim, DO [facility] 11/29/2022, 8:06 PM

## 2022-11-29 NOTE — ED Notes (Signed)
Critical lactic acid result 2.0 received from Ron[facility] Fresh[facility], lab department. Text message sent to Dr R. Dixon, waiting for response.

## 2022-11-29 NOTE — Assessment & Plan Note (Signed)
Check TIBC, iron studies. Pt does not require PRBC transfusion at this time.

## 2022-11-29 NOTE — Subjective & Objective (Addendum)
CC: hypoxia HPI: 77 yo WF with history of COPD, dementia, hyperlipidemia presents to the ER today with reported hypoxia and cough.  Patient unable to give any history review of systems.  History obtained for the patient's son [name].  Patient recently moved into [facility] (assisted living with memory care) at the end of January 2024.  She has been at the facility for about 2 weeks.  She was living at home prior to this.  Reason for admission to assisted living due to worsening dementia and falls at home.  Son states over the last 2 to 3 days, the patient had increasing shortness of breath.  He was called today due to hypoxia.  Reported room air saturations were 87%.  EMS noted patient febrile to 101.2.  On arrival to the ER,, temp 98.4 heart rate 100 blood pressure 114/45 satting 89% on room air.  Labs showed a white count of 22.4, hemotympanums 0, platelets of 267  Venous blood gas pH 7.44 pCO2 39 pO2 of 47  Sodium 133, potassium 3.6, BUN 23, creatinine 1.44  Lactic acid 2.0.  Chest x-ray demonstrated right lower lobe pneumonia.  [facility] hospitalist contacted for admission.  Echocardiogram 09/26/2022:  Left ventricle cavity is normal in size and wall thickness. Normal global  wall motion. Normal LV systolic function with EF 66%. Normal diastolic  filling pattern.  No significant valvular abnormality.  Normal right atrial pressure.

## 2022-11-29 NOTE — ED Notes (Signed)
ED TO INPATIENT HANDOFF REPORT  ED Nurse Name and Phone #:  Gennaro Africa [id number] Name/Age/Gender Susan Collins 77 y.o. female Room/Bed: 012C/012C  Code Status   Code Status: DNR  Home/SNF/Other Nursing Home Patient oriented to: self, place, and situation Is this baseline? Yes   Triage Complete: Triage complete  Chief Complaint RLL pneumonia [J18.9]  Triage Note No notes on file   Allergies Allergies  Allergen Reactions   Sulfonamide Derivatives     REACTION: headache    Level of Care/Admitting Diagnosis ED Disposition     ED Disposition  Admit   Condition  --   [place]: [place] [id number]  Level of Care: Med-Surg [16]  May place patient in observation at [facility] or [facility] if equivalent level of care is available:: No  Covid Evaluation: Asymptomatic - no recent exposure (last 10 days) testing not required  Diagnosis: RLL pneumonia [id number]  Admitting Physician: Bridgett Larsson, [place]  Attending Physician: Bridgett Larsson, ERIC [[id number]]          B Medical/Surgery History Past Medical History:  Diagnosis Date   CHEST PAIN 11/20/2008   COLONIC POLYPS, HX OF 05/24/2007   Diverticular disease    DYSPNEA ON EXERTION 12/19/2008   HYPERCHOLESTEROLEMIA 09/18/2008   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   Lobar pneumonia, unspecified organism ([place]) 06/24/2021   Peripheral neuropathy    POSTMENOPAUSAL SYNDROME 04/12/2008   TINNITUS, 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   Past Surgical History:  Procedure Laterality Date   ABDOMINAL HYSTERECTOMY     LUMBAR LAMINECTOMY     NASAL SINUS SURGERY     SHOULDER SURGERY       A IV Location/Drains/Wounds Patient Lines/Drains/Airways Status     Active Line/Drains/Airways     Name Placement date Placement time Site Days   Peripheral IV 11/29/22 20 G Distal;Posterior;Right Forearm 11/29/22  1645  Forearm  less than 1   Peripheral IV 11/29/22 20 G Anterior;Left;Proximal Forearm  11/29/22  1713  Forearm  less than 1            Intake/Output Last 24 hours  Intake/Output Summary (Last 24 hours) at 11/29/2022 2035 Last data filed at 11/29/2022 1911 Gross per 24 hour  Intake 1400 ml  Output --  Net 1400 ml    Labs/Imaging Results for orders placed or performed during the hospital encounter of 11/29/22 (from the past 48 hour(s))  Resp panel by RT-PCR (RSV, Flu A&B, Covid) Anterior Nasal Swab     Status: None   Collection Time: 11/29/22  5:08 PM   Specimen: Anterior Nasal Swab  Result Value Ref Range   SARS Coronavirus 2 by RT PCR NEGATIVE NEGATIVE   Influenza A by PCR NEGATIVE NEGATIVE   Influenza B by PCR NEGATIVE NEGATIVE    Comment: (NOTE) The Xpert Xpress SARS-CoV-2/FLU/RSV plus assay is intended as an aid in the diagnosis of influenza from Nasopharyngeal swab specimens and should not be used as a sole basis for treatment. Nasal washings and aspirates are unacceptable for Xpert Xpress SARS-CoV-2/FLU/RSV testing.  Fact Sheet for Patients: EntrepreneurPulse.com.au  Fact Sheet for Healthcare Providers: IncredibleEmployment.be  This test is not yet approved or cleared by the [place] FDA and has been authorized for detection and/or diagnosis of SARS-CoV-2 by FDA under an Emergency Use Authorization (EUA). This EUA will remain in effect (meaning this test can be used) for the duration of the COVID-19 declaration under Section 564(b)(1) of the Act, 21 U.S.C.  section 360bbb-3(b)(1), unless the authorization is terminated or revoked.     Resp Syncytial Virus by PCR NEGATIVE NEGATIVE    Comment: (NOTE) Fact Sheet for Patients: EntrepreneurPulse.com.au  Fact Sheet for Healthcare Providers: IncredibleEmployment.be  This test is not yet approved or cleared by the [place] FDA and has been authorized for detection and/or diagnosis of SARS-CoV-2 by FDA under an Emergency  Use Authorization (EUA). This EUA will remain in effect (meaning this test can be used) for the duration of the COVID-19 declaration under Section 564(b)(1) of the Act, 21 U.S.C. section 360bbb-3(b)(1), unless the authorization is terminated or revoked.  Performed at [facility], [place] [address], [place], [place] [address]   Lactic acid, plasma     Status: Abnormal   Collection Time: 11/29/22  5:08 PM  Result Value Ref Range   Lactic Acid, Venous 2.0 (HH) 0.5 - 1.9 mmol/L    Comment: CRITICAL RESULT CALLED TO, READ BACK BY AND VERIFIED WITH C BLANKS RN AT 1835 11/29/22 BY D LONG Performed at [facility], [place] [address] [place], [place] [address]   Comprehensive metabolic panel     Status: Abnormal   Collection Time: 11/29/22  5:08 PM  Result Value Ref Range   Sodium 133 (L) 135 - 145 mmol/L   Potassium 3.6 3.5 - 5.1 mmol/L   Chloride 99 98 - 111 mmol/L   CO2 22 22 - 32 mmol/L   Glucose, Bld 188 (H) 70 - 99 mg/dL    Comment: Glucose reference range applies only to samples taken after fasting for at least 8 hours.   BUN 23 8 - 23 mg/dL   Creatinine, Ser 1.44 (H) 0.44 - 1.00 mg/dL   Calcium 8.9 8.9 - 10.3 mg/dL   Total Protein 6.9 6.5 - 8.1 g/dL   Albumin 2.5 (L) 3.5 - 5.0 g/dL   AST 38 15 - 41 U/L   ALT 25 0 - 44 U/L   Alkaline Phosphatase 101 38 - 126 U/L   Total Bilirubin 0.7 0.3 - 1.2 mg/dL   GFR, Estimated 37 (L) >60 mL/min    Comment: (NOTE) Calculated using the CKD-EPI Creatinine Equation (2021)    Anion gap 12 5 - 15    Comment: Performed at [facility] [address], [place], [place] [address]  CBC with Differential     Status: Abnormal   Collection Time: 11/29/22  5:08 PM  Result Value Ref Range   WBC 22.4 (H) 4.0 - 10.5 K/uL   RBC 3.37 (L) 3.87 - 5.11 MIL/uL   Hemoglobin 10.0 (L) 12.0 - 15.0 g/dL   HCT 30.7 (L) 36.0 - 46.0 %   MCV 91.1 80.0 - 100.0 fL   MCH 29.7 26.0 - 34.0 pg   MCHC 32.6 30.0 - 36.0 g/dL   RDW 14.0 11.5 - 15.5 %    Platelets 267 150 - 400 K/uL   nRBC 0.0 0.0 - 0.2 %   Neutrophils Relative % 88 %   Neutro Abs 19.5 (H) 1.7 - 7.7 K/uL   Lymphocytes Relative 4 %   Lymphs Abs 0.9 0.7 - 4.0 K/uL   Monocytes Relative 8 %   Monocytes Absolute 1.9 (H) 0.1 - 1.0 K/uL   Eosinophils Relative 0 %   Eosinophils Absolute 0.0 0.0 - 0.5 K/uL   Basophils Relative 0 %   Basophils Absolute 0.0 0.0 - 0.1 K/uL   Immature Granulocytes 0 %   Abs Immature Granulocytes 0.05 0.00 -  0.07 K/uL    Comment: Performed at [facility], [place] [address] [place], [place] [address]  Brain natriuretic peptide     Status: Abnormal   Collection Time: 11/29/22  5:08 PM  Result Value Ref Range   B Natriuretic Peptide 127.6 (H) 0.0 - 100.0 pg/mL    Comment: Performed at [facility] [address] [place], [place] [address]  Magnesium     Status: Abnormal   Collection Time: 11/29/22  5:08 PM  Result Value Ref Range   Magnesium 2.5 (H) 1.7 - 2.4 mg/dL    Comment: Performed at [facility] [address], [place], [place] [address]  I-Stat venous blood gas, ED ([facility])     Status: Abnormal   Collection Time: 11/29/22  6:12 PM  Result Value Ref Range   pH, Ven 7.477 (H) 7.25 - 7.43   pCO2, Ven 37.0 (L) 44 - 60 mmHg   pO2, Ven 47 (H) 32 - 45 mmHg   Bicarbonate 27.4 20.0 - 28.0 mmol/L   TCO2 28 22 - 32 mmol/L   O2 Saturation 86 %   Acid-Base Excess 4.0 (H) 0.0 - 2.0 mmol/L   Sodium 135 135 - 145 mmol/L   Potassium 3.8 3.5 - 5.1 mmol/L   Calcium, Ion 1.18 1.15 - 1.40 mmol/L   HCT 27.0 (L) 36.0 - 46.0 %   Hemoglobin 9.2 (L) 12.0 - 15.0 g/dL   Sample type VENOUS   Lactic acid, plasma     Status: None   Collection Time: 11/29/22  7:08 PM  Result Value Ref Range   Lactic Acid, Venous 0.9 0.5 - 1.9 mmol/L    Comment: Performed at [facility], [place] [address] [place],  [address]   DG Chest Port 1 View  Result Date: 11/29/2022 CLINICAL DATA:  Questionable sepsis. EXAM: PORTABLE CHEST 1 VIEW  COMPARISON:  01/29/2022 and older exams. FINDINGS: Cardiac silhouette is normal in size. No mediastinal or hilar masses. Lungs are hyperexpanded. There are interstitial and patchy airspace opacities at the lung bases, right greater than left. Possible small effusions. Mid and upper lungs are clear. No pneumothorax. Skeletal structures are demineralized, grossly intact. IMPRESSION: 1. Interstitial and hazy airspace opacities at the lung bases. Findings may reflect atelectasis, infection or a combination. No convincing pulmonary edema. Electronically Signed   By: Lajean Manes M.D.   On: 11/29/2022 17:31    Pending Labs Unresulted Labs (From admission, onward)     Start     Ordered   11/30/22 0500  Comprehensive metabolic panel  Tomorrow morning,   R        11/29/22 2007   11/30/22 0500  CBC with Differential/Platelet  Tomorrow morning,   R        11/29/22 2007   11/30/22 0500  Magnesium  Tomorrow morning,   R        11/29/22 2007   11/29/22 1800  Protime-INR  Once,   STAT        11/29/22 1800   11/29/22 1708  Blood Culture (routine x 2)  (Septic presentation on arrival (screening labs, nursing and treatment orders for obvious sepsis))  BLOOD CULTURE X 2,   STAT      11/29/22 1708   11/29/22 1708  Urinalysis, w/ Reflex to Culture (Infection Suspected) -Urine, Clean Catch  (Septic presentation on arrival (screening labs, nursing and treatment orders for obvious sepsis))  Once,   URGENT  Question:  Specimen Source  Answer:  Urine, Clean Catch   11/29/22 1708   Signed and Held  Legionella Pneumophila Serogp 1 Ur Ag  (COPD / Pneumonia / Cellulitis / Lower Extremity Wound)  ONCE - URGENT,   R        Signed and Held   Signed and Held  Strep pneumoniae urinary antigen  (COPD / Pneumonia / Cellulitis / Lower Extremity Wound)  ONCE - URGENT,   R        Signed and Held   Signed and Held  Iron and TIBC  Add-on,   R        Signed and Held            Vitals/Pain Today's Vitals   11/29/22 1845  11/29/22 1846 11/29/22 1900 11/29/22 1945  BP:   (!) 116/40 (!) 108/43  Pulse:   90 87  Resp:   18 16  Temp:      TempSrc:      SpO2:   93% 92%  Weight: 75 kg     Height: 5' 3"$  (1.6 m)     PainSc:  0-No pain      Isolation Precautions No active isolations  Medications Medications  lactated ringers infusion ( Intravenous New Bag/Given 11/29/22 1954)  cefTRIAXone (ROCEPHIN) 2 g in sodium chloride 0.9 % 100 mL IVPB (0 g Intravenous Stopped 11/29/22 1853)  azithromycin (ZITHROMAX) 500 mg in sodium chloride 0.9 % 250 mL IVPB (500 mg Intravenous New Bag/Given 11/29/22 1951)  sodium chloride 0.9 % bolus 1,000 mL (0 mLs Intravenous Stopped 11/29/22 1911)  ipratropium-albuterol (DUONEB) 0.5-2.5 (3) MG/3ML nebulizer solution 3 mL (3 mLs Nebulization Given 11/29/22 1755)  methylPREDNISolone sodium succinate (SOLU-MEDROL) 125 mg/2 mL injection 125 mg (125 mg Intravenous Given 11/29/22 1755)    Mobility non-ambulatory     Focused Assessments Neuro Assessment Handoff:  Swallow screen pass? Yes          Neuro Assessment:   Neuro Checks:      Has TPA been given? No If patient is a Neuro Trauma and patient is going to OR before floor call report to 4N Charge nurse: [phone] or [phone]  , Pulmonary Assessment Handoff:  Lung sounds: Bilateral Breath Sounds: Rhonchi L Breath Sounds: Rhonchi R Breath Sounds: Rhonchi O2 Device: Nasal Cannula O2 Flow Rate (L/min): 2 L/min    R Recommendations: See Admitting Provider Note  Report given to:   Additional Notes:

## 2022-11-29 NOTE — Assessment & Plan Note (Signed)
Likely exacerbated by RLL. Start po prednisone. Start duonebs q6h.

## 2022-11-29 NOTE — Plan of Care (Signed)

## 2022-11-29 NOTE — Assessment & Plan Note (Signed)
Admit to med/surg. Continue with IV rocephin/zithromax. Continue with pulmonary toileting.

## 2022-11-29 NOTE — Assessment & Plan Note (Signed)
Verified DNR status with son. Reviewed DNR form.

## 2022-11-30 DIAGNOSIS — N179 Acute kidney failure, unspecified: Secondary | ICD-10-CM | POA: Diagnosis present

## 2022-11-30 DIAGNOSIS — Z90710 Acquired absence of both cervix and uterus: Secondary | ICD-10-CM | POA: Diagnosis not present

## 2022-11-30 DIAGNOSIS — Z1152 Encounter for screening for COVID-19: Secondary | ICD-10-CM | POA: Diagnosis not present

## 2022-11-30 DIAGNOSIS — Z87891 Personal history of nicotine dependence: Secondary | ICD-10-CM | POA: Diagnosis not present

## 2022-11-30 DIAGNOSIS — Z801 Family history of malignant neoplasm of trachea, bronchus and lung: Secondary | ICD-10-CM | POA: Diagnosis not present

## 2022-11-30 DIAGNOSIS — I878 Other specified disorders of veins: Secondary | ICD-10-CM | POA: Diagnosis present

## 2022-11-30 DIAGNOSIS — E7800 Pure hypercholesterolemia, unspecified: Secondary | ICD-10-CM | POA: Diagnosis present

## 2022-11-30 DIAGNOSIS — A4189 Other specified sepsis: Secondary | ICD-10-CM | POA: Diagnosis present

## 2022-11-30 DIAGNOSIS — R6520 Severe sepsis without septic shock: Secondary | ICD-10-CM | POA: Diagnosis present

## 2022-11-30 DIAGNOSIS — D649 Anemia, unspecified: Secondary | ICD-10-CM | POA: Diagnosis present

## 2022-11-30 DIAGNOSIS — J9601 Acute respiratory failure with hypoxia: Secondary | ICD-10-CM | POA: Diagnosis present

## 2022-11-30 DIAGNOSIS — Z79899 Other long term (current) drug therapy: Secondary | ICD-10-CM | POA: Diagnosis not present

## 2022-11-30 DIAGNOSIS — J189 Pneumonia, unspecified organism: Secondary | ICD-10-CM | POA: Diagnosis present

## 2022-11-30 DIAGNOSIS — Z9181 History of falling: Secondary | ICD-10-CM | POA: Diagnosis not present

## 2022-11-30 DIAGNOSIS — Z86010 Personal history of colonic polyps: Secondary | ICD-10-CM | POA: Diagnosis not present

## 2022-11-30 DIAGNOSIS — J440 Chronic obstructive pulmonary disease with acute lower respiratory infection: Secondary | ICD-10-CM | POA: Diagnosis present

## 2022-11-30 DIAGNOSIS — J441 Chronic obstructive pulmonary disease with (acute) exacerbation: Secondary | ICD-10-CM | POA: Diagnosis present

## 2022-11-30 DIAGNOSIS — F0390 Unspecified dementia without behavioral disturbance: Secondary | ICD-10-CM | POA: Diagnosis present

## 2022-11-30 DIAGNOSIS — Z8052 Family history of malignant neoplasm of bladder: Secondary | ICD-10-CM | POA: Diagnosis not present

## 2022-11-30 DIAGNOSIS — Z8249 Family history of ischemic heart disease and other diseases of the circulatory system: Secondary | ICD-10-CM | POA: Diagnosis not present

## 2022-11-30 DIAGNOSIS — Z882 Allergy status to sulfonamides status: Secondary | ICD-10-CM | POA: Diagnosis not present

## 2022-11-30 DIAGNOSIS — Z66 Do not resuscitate: Secondary | ICD-10-CM | POA: Diagnosis present

## 2022-11-30 DIAGNOSIS — R296 Repeated falls: Secondary | ICD-10-CM | POA: Diagnosis present

## 2022-11-30 DIAGNOSIS — Z825 Family history of asthma and other chronic lower respiratory diseases: Secondary | ICD-10-CM | POA: Diagnosis not present

## 2022-11-30 LAB — COMPREHENSIVE METABOLIC PANEL
ALT: 24 U/L (ref 0–44)
AST: 34 U/L (ref 15–41)
Albumin: 2.1 g/dL — ABNORMAL LOW (ref 3.5–5.0)
Alkaline Phosphatase: 96 U/L (ref 38–126)
Anion gap: 10 (ref 5–15)
BUN: 22 mg/dL (ref 8–23)
CO2: 23 mmol/L (ref 22–32)
Calcium: 8.4 mg/dL — ABNORMAL LOW (ref 8.9–10.3)
Chloride: 102 mmol/L (ref 98–111)
Creatinine, Ser: 1.18 mg/dL — ABNORMAL HIGH (ref 0.44–1.00)
GFR, Estimated: 47 mL/min — ABNORMAL LOW (ref >60)
Glucose, Bld: 183 mg/dL — ABNORMAL HIGH (ref 70–99)
Potassium: 3.6 mmol/L (ref 3.5–5.1)
Sodium: 135 mmol/L (ref 135–145)
Total Bilirubin: 0.2 mg/dL — ABNORMAL LOW (ref 0.3–1.2)
Total Protein: 6.3 g/dL — ABNORMAL LOW (ref 6.5–8.1)

## 2022-11-30 LAB — CBC WITH DIFFERENTIAL/PLATELET
Abs Immature Granulocytes: 0.08 10*3/uL — ABNORMAL HIGH (ref 0.00–0.07)
Basophils Absolute: 0 10*3/uL (ref 0.0–0.1)
Basophils Relative: 0 %
Eosinophils Absolute: 0.1 10*3/uL (ref 0.0–0.5)
Eosinophils Relative: 1 %
HCT: 28.9 % — ABNORMAL LOW (ref 36.0–46.0)
Hemoglobin: 9.8 g/dL — ABNORMAL LOW (ref 12.0–15.0)
Immature Granulocytes: 0 %
Lymphocytes Relative: 4 %
Lymphs Abs: 0.8 10*3/uL (ref 0.7–4.0)
MCH: 30.2 pg (ref 26.0–34.0)
MCHC: 33.9 g/dL (ref 30.0–36.0)
MCV: 89.2 fL (ref 80.0–100.0)
Monocytes Absolute: 0.6 10*3/uL (ref 0.1–1.0)
Monocytes Relative: 3 %
Neutro Abs: 19.5 10*3/uL — ABNORMAL HIGH (ref 1.7–7.7)
Neutrophils Relative %: 92 %
Platelets: 242 10*3/uL (ref 150–400)
RBC: 3.24 MIL/uL — ABNORMAL LOW (ref 3.87–5.11)
RDW: 14 % (ref 11.5–15.5)
WBC: 21.2 10*3/uL — ABNORMAL HIGH (ref 4.0–10.5)
nRBC: 0 % (ref 0.0–0.2)

## 2022-11-30 LAB — URINALYSIS, W/ REFLEX TO CULTURE (INFECTION SUSPECTED)
Bilirubin Urine: NEGATIVE
Glucose, UA: NEGATIVE mg/dL
Hgb urine dipstick: NEGATIVE
Ketones, ur: NEGATIVE mg/dL
Leukocytes,Ua: NEGATIVE
Nitrite: NEGATIVE
Protein, ur: 30 mg/dL — AB
Specific Gravity, Urine: 1.025 (ref 1.005–1.030)
pH: 5.5 (ref 5.0–8.0)

## 2022-11-30 LAB — PROTIME-INR
INR: 1.2 (ref 0.8–1.2)
Prothrombin Time: 14.9 seconds (ref 11.4–15.2)

## 2022-11-30 LAB — PROCALCITONIN: Procalcitonin: 0.49 ng/mL

## 2022-11-30 LAB — IRON AND TIBC
Iron: 10 ug/dL — ABNORMAL LOW (ref 28–170)
Saturation Ratios: 5 % — ABNORMAL LOW (ref 10.4–31.8)
TIBC: 218 ug/dL — ABNORMAL LOW (ref 250–450)
UIBC: 208 ug/dL

## 2022-11-30 LAB — STREP PNEUMONIAE URINARY ANTIGEN: Strep Pneumo Urinary Antigen: NEGATIVE

## 2022-11-30 LAB — MAGNESIUM: Magnesium: 2.5 mg/dL — ABNORMAL HIGH (ref 1.7–2.4)

## 2022-11-30 MED ORDER — MONTELUKAST SODIUM 10 MG PO TABS
10.0000 mg | ORAL_TABLET | Freq: Every day | ORAL | Status: DC
  Administered 2022-11-30 – 2022-12-01 (×2): 10 mg via ORAL
  Filled 2022-11-30 (×2): qty 1

## 2022-11-30 MED ORDER — SIMVASTATIN 20 MG PO TABS
40.0000 mg | ORAL_TABLET | Freq: Every day | ORAL | Status: DC
  Administered 2022-11-30 – 2022-12-02 (×3): 40 mg via ORAL
  Filled 2022-11-30 (×3): qty 2

## 2022-11-30 MED ORDER — AZITHROMYCIN 500 MG PO TABS
500.0000 mg | ORAL_TABLET | ORAL | Status: DC
  Administered 2022-11-30 – 2022-12-01 (×2): 500 mg via ORAL
  Filled 2022-11-30 (×3): qty 1

## 2022-11-30 NOTE — Progress Notes (Addendum)
PROGRESS NOTE    Susan Collins  [id number] DOB: 03-25-1946 DOA: 11/29/2022 PCP: Sueanne Margarita, DO   Brief Narrative:  HPI: 77 yo WF with history of COPD, dementia, hyperlipidemia presents to the ER today with reported hypoxia and cough.  Patient unable to give any history review of systems.  History obtained for the patient's son [name].  Patient recently moved into [facility] (assisted living with memory care) at the end of January 2024.  She has been at the facility for about 2 weeks.  She was living at home prior to this.  Reason for admission to assisted living due to worsening dementia and falls at home.   Son states over the last 2 to 3 days, the patient had increasing shortness of breath.  He was called today due to hypoxia.  Reported room air saturations were 87%.  EMS noted patient febrile to 101.2.   On arrival to the ER,, temp 98.4 heart rate 100 blood pressure 114/45 satting 89% on room air.   Labs showed a white count of 22.4, hemotympanums 0, platelets of 267   Venous blood gas pH 7.44 pCO2 39 pO2 of 47   Sodium 133, potassium 3.6, BUN 23, creatinine 1.44   Lactic acid 2.0.   Chest x-ray demonstrated right lower lobe pneumonia.   [facility] hospitalist contacted for admission.   Echocardiogram 09/26/2022:  Left ventricle cavity is normal in size and wall thickness. Normal global  wall motion. Normal LV systolic function with EF 66%. Normal diastolic  filling pattern.  No significant valvular abnormality.  Normal right atrial pressure.     ED Course: CXR shows RLL pneumonia, WBC 22K, Scr 1.44  Assessment & Plan:   Principal Problem:   RLL pneumonia Active Problems:   Severe sepsis (HCC)   Acute respiratory failure with hypoxia (HCC)   COPD with acute exacerbation (HCC)   Normocytic anemia   Hyperlipidemia   Dementia (HCC)   AKI (acute kidney injury) ([place])   DNR (do not resuscitate)  Severe sepsis and acute hypoxic respiratory failure secondary to  right lower lobe community-acquired pneumonia: Patient meets criteria for severe sepsis based on tachypnea, leukocytosis and acute hypoxic respiratory failure.  Patient still requiring 2 L of oxygen, continue Rocephin and Zithromax, follow culture, follow urine antigen for Legionella and streptococci and tailor antibiotics accordingly.  Wean oxygen as able to.  AKI: Creatinine improving.  Hyperlipidemia: Continue Zocor.  Normocytic anemia: Hemoglobin stable.  Acute COPD exacerbation: No wheezes on my exam, continue prednisone and DuoNeb.  DVT prophylaxis: heparin injection 5,000 Units Start: 11/30/22 0600 SCDs Start: 11/29/22 2358   Code Status: DNR  Family Communication:  None present at bedside.  Plan of care discussed with patient's daughter over the phone.  Status is: Observation The patient will require care spanning > 2 midnights and should be moved to inpatient because: Still hypoxic.   Estimated body mass index is 29.29 kg/m as calculated from the following:   Height as of this encounter: 5' 3"$  (1.6 m).   Weight as of this encounter: 75 kg.    Nutritional Assessment: Body mass index is 29.29 kg/m.Marland Kitchen Seen by dietician.  I agree with the assessment and plan as outlined below: Nutrition Status:        . Skin Assessment: I have examined the patient's skin and I agree with the wound assessment as performed by the wound care RN as outlined below:    Consultants:  None  Procedures:  None  Antimicrobials:  Anti-infectives (From admission, onward)    Start     Dose/Rate Route Frequency Ordered Stop   11/30/22 1700  azithromycin (ZITHROMAX) tablet 500 mg        500 mg Oral Every 24 hours 11/30/22 0808     11/29/22 1715  cefTRIAXone (ROCEPHIN) 2 g in sodium chloride 0.9 % 100 mL IVPB        2 g 200 mL/hr over 30 Minutes Intravenous Every 24 hours 11/29/22 1708 12/04/22 1714   11/29/22 1715  azithromycin (ZITHROMAX) 500 mg in sodium chloride 0.9 % 250 mL IVPB  Status:   Discontinued        500 mg 250 mL/hr over 60 Minutes Intravenous Every 24 hours 11/29/22 1708 11/30/22 [id number]         Subjective: Patient seen and examined.  She denies any complaint, denies any shortness of breath.  She is alert and oriented to self and place.  This is likely her baseline.  Objective: Vitals:   11/30/22 0226 11/30/22 0401 11/30/22 0750 11/30/22 0816  BP:  (!) 114/55  (!) 134/41  Pulse:  80  79  Resp:  18  15  Temp:  98 F (36.7 C)  97.8 F (36.6 C)  TempSrc:  Oral  Oral  SpO2: 95% 96% 97% 97%  Weight:      Height:        Intake/Output Summary (Last 24 hours) at 11/30/2022 1309 Last data filed at 11/30/2022 0305 Gross per 24 hour  Intake 2391.05 ml  Output --  Net 2391.05 ml   Filed Weights   11/29/22 1845  Weight: 75 kg    Examination:  General exam: Appears calm and comfortable  Respiratory system: Clear to auscultation. Respiratory effort normal. Cardiovascular system: S1 & S2 heard, RRR. No JVD, murmurs, rubs, gallops or clicks. No pedal edema. Gastrointestinal system: Abdomen is nondistended, soft and nontender. No organomegaly or masses felt. Normal bowel sounds heard. Central nervous system: Alert and oriented x 2. No focal neurological deficits. Extremities: Symmetric 5 x 5 power. Skin: No rashes, lesions or ulcers   Data Reviewed: I have personally reviewed following labs and imaging studies  CBC: Recent Labs  Lab 11/29/22 1708 11/29/22 1812 11/30/22 0208  WBC 22.4*  --  21.2*  NEUTROABS 19.5*  --  19.5*  HGB 10.0* 9.2* 9.8*  HCT 30.7* 27.0* 28.9*  MCV 91.1  --  89.2  PLT 267  --  [id number]   Basic Metabolic Panel: Recent Labs  Lab 11/29/22 1708 11/29/22 1812 11/30/22 0208  NA 133* 135 135  K 3.6 3.8 3.6  CL 99  --  102  CO2 22  --  23  GLUCOSE 188*  --  183*  BUN 23  --  22  CREATININE 1.44*  --  1.18*  CALCIUM 8.9  --  8.4*  MG 2.5*  --  2.5*   GFR: Estimated Creatinine Clearance: 38.1 mL/min (A) (by C-G formula based  on SCr of 1.18 mg/dL (H)). Liver Function Tests: Recent Labs  Lab 11/29/22 1708 11/30/22 0208  AST 38 34  ALT 25 24  ALKPHOS 101 96  BILITOT 0.7 0.2*  PROT 6.9 6.3*  ALBUMIN 2.5* 2.1*   No results for input(s): "LIPASE", "AMYLASE" in the last 168 hours. No results for input(s): "AMMONIA" in the last 168 hours. Coagulation Profile: Recent Labs  Lab 11/30/22 0208  INR 1.2   Cardiac Enzymes: No results for input(s): "CKTOTAL", "CKMB", "CKMBINDEX", "TROPONINI" in the last 168 hours.  BNP (last 3 results) Recent Labs    01/31/22 0900  PROBNP 44.0   HbA1C: No results for input(s): "HGBA1C" in the last 72 hours. CBG: No results for input(s): "GLUCAP" in the last 168 hours. Lipid Profile: No results for input(s): "CHOL", "HDL", "LDLCALC", "TRIG", "CHOLHDL", "LDLDIRECT" in the last 72 hours. Thyroid Function Tests: No results for input(s): "TSH", "T4TOTAL", "FREET4", "T3FREE", "THYROIDAB" in the last 72 hours. Anemia Panel: Recent Labs    11/30/22 0208  TIBC 218*  IRON 10*   Sepsis Labs: Recent Labs  Lab 11/29/22 1708 11/29/22 1908 11/30/22 0200  PROCALCITON  --   --  0.49  LATICACIDVEN 2.0* 0.9  --     Recent Results (from the past 240 hour(s))  Resp panel by RT-PCR (RSV, Flu A&B, Covid) Anterior Nasal Swab     Status: None   Collection Time: 11/29/22  5:08 PM   Specimen: Anterior Nasal Swab  Result Value Ref Range Status   SARS Coronavirus 2 by RT PCR NEGATIVE NEGATIVE Final   Influenza A by PCR NEGATIVE NEGATIVE Final   Influenza B by PCR NEGATIVE NEGATIVE Final    Comment: (NOTE) The Xpert Xpress SARS-CoV-2/FLU/RSV plus assay is intended as an aid in the diagnosis of influenza from Nasopharyngeal swab specimens and should not be used as a sole basis for treatment. Nasal washings and aspirates are unacceptable for Xpert Xpress SARS-CoV-2/FLU/RSV testing.  Fact Sheet for Patients: EntrepreneurPulse.com.au  Fact Sheet for Healthcare  Providers: IncredibleEmployment.be  This test is not yet approved or cleared by the [place] FDA and has been authorized for detection and/or diagnosis of SARS-CoV-2 by FDA under an Emergency Use Authorization (EUA). This EUA will remain in effect (meaning this test can be used) for the duration of the COVID-19 declaration under Section 564(b)(1) of the Act, 21 U.S.C. section 360bbb-3(b)(1), unless the authorization is terminated or revoked.     Resp Syncytial Virus by PCR NEGATIVE NEGATIVE Final    Comment: (NOTE) Fact Sheet for Patients: EntrepreneurPulse.com.au  Fact Sheet for Healthcare Providers: IncredibleEmployment.be  This test is not yet approved or cleared by the [place] FDA and has been authorized for detection and/or diagnosis of SARS-CoV-2 by FDA under an Emergency Use Authorization (EUA). This EUA will remain in effect (meaning this test can be used) for the duration of the COVID-19 declaration under Section 564(b)(1) of the Act, 21 U.S.C. section 360bbb-3(b)(1), unless the authorization is terminated or revoked.  Performed at [facility], [place] [address] [place], [place] [address]   Blood Culture (routine x 2)     Status: None (Preliminary result)   Collection Time: 11/29/22  6:45 PM   Specimen: BLOOD LEFT FOREARM  Result Value Ref Range Status   Specimen Description BLOOD LEFT FOREARM  Final   Special Requests   Final    BOTTLES DRAWN AEROBIC AND ANAEROBIC Blood Culture results may not be optimal due to an inadequate volume of blood received in culture bottles   Culture   Final    NO GROWTH < 24 HOURS Performed at [facility], [place] [address] [place], [place] [address]    Report Status PENDING  Incomplete  Blood Culture (routine x 2)     Status: None (Preliminary result)   Collection Time: 11/29/22  6:46 PM   Specimen: BLOOD  Result Value Ref Range Status   Specimen Description  BLOOD LEFT ANTECUBITAL  Final   Special Requests   Final    BOTTLES DRAWN  AEROBIC AND ANAEROBIC Blood Culture results may not be optimal due to an inadequate volume of blood received in culture bottles   Culture   Final    NO GROWTH < 24 HOURS Performed at [facility] [address] [place], [place] [address]    Report Status PENDING  Incomplete     Radiology Studies: DG Chest Port 1 View  Result Date: 11/29/2022 CLINICAL DATA:  Questionable sepsis. EXAM: PORTABLE CHEST 1 VIEW COMPARISON:  01/29/2022 and older exams. FINDINGS: Cardiac silhouette is normal in size. No mediastinal or hilar masses. Lungs are hyperexpanded. There are interstitial and patchy airspace opacities at the lung bases, right greater than left. Possible small effusions. Mid and upper lungs are clear. No pneumothorax. Skeletal structures are demineralized, grossly intact. IMPRESSION: 1. Interstitial and hazy airspace opacities at the lung bases. Findings may reflect atelectasis, infection or a combination. No convincing pulmonary edema. Electronically Signed   By: Lajean Manes M.D.   On: 11/29/2022 17:31    Scheduled Meds:  azithromycin  500 mg Oral Q24H   heparin  5,000 Units Subcutaneous Q8H   ipratropium-albuterol  3 mL Nebulization Q6H   predniSONE  40 mg Oral Q breakfast   Continuous Infusions:  cefTRIAXone (ROCEPHIN)  IV Stopped (11/29/22 1853)     LOS: 0 days   Darliss Cheney, MD [facility]  11/30/2022, 1:09 PM   *Please note that this is a verbal dictation therefore any spelling or grammatical errors are due to the "[place] [employer]" system interpretation.  Please page via [place] and do not message via secure chat for urgent patient care matters. Secure chat can be used for non urgent patient care matters.  How to contact the [facility] Attending or Consulting provider [place] or covering provider during after hours [place], for this patient?  Check the care team in [facility] and look for a)  attending/consulting [facility] provider listed and b) the [facility] team listed. Page or secure chat [phone]. Log into [email or web address] and use [place]'s universal password to access. If you do not have the password, please contact the hospital operator. Locate the [facility] provider you are looking for under [facility] and page to a number that you can be directly reached. If you still have difficulty reaching the provider, please page the [facility] (Director on Call) for the Hospitalists listed on amion for assistance.

## 2022-12-01 DIAGNOSIS — J189 Pneumonia, unspecified organism: Secondary | ICD-10-CM | POA: Diagnosis not present

## 2022-12-01 LAB — CBC WITH DIFFERENTIAL/PLATELET
Abs Immature Granulocytes: 0 10*3/uL (ref 0.00–0.07)
Basophils Absolute: 0 10*3/uL (ref 0.0–0.1)
Basophils Relative: 0 %
Eosinophils Absolute: 0 10*3/uL (ref 0.0–0.5)
Eosinophils Relative: 0 %
HCT: 26.8 % — ABNORMAL LOW (ref 36.0–46.0)
Hemoglobin: 8.9 g/dL — ABNORMAL LOW (ref 12.0–15.0)
Lymphocytes Relative: 3 %
Lymphs Abs: 0.8 10*3/uL (ref 0.7–4.0)
MCH: 29.7 pg (ref 26.0–34.0)
MCHC: 33.2 g/dL (ref 30.0–36.0)
MCV: 89.3 fL (ref 80.0–100.0)
Monocytes Absolute: 1.3 10*3/uL — ABNORMAL HIGH (ref 0.1–1.0)
Monocytes Relative: 5 %
Neutro Abs: 23.7 10*3/uL — ABNORMAL HIGH (ref 1.7–7.7)
Neutrophils Relative %: 92 %
Platelets: 295 10*3/uL (ref 150–400)
RBC: 3 MIL/uL — ABNORMAL LOW (ref 3.87–5.11)
RDW: 14 % (ref 11.5–15.5)
WBC: 25.8 10*3/uL — ABNORMAL HIGH (ref 4.0–10.5)
nRBC: 0 % (ref 0.0–0.2)
nRBC: 0 /100 WBC

## 2022-12-01 LAB — BASIC METABOLIC PANEL
Anion gap: 9 (ref 5–15)
BUN: 30 mg/dL — ABNORMAL HIGH (ref 8–23)
CO2: 25 mmol/L (ref 22–32)
Calcium: 8.8 mg/dL — ABNORMAL LOW (ref 8.9–10.3)
Chloride: 104 mmol/L (ref 98–111)
Creatinine, Ser: 1.13 mg/dL — ABNORMAL HIGH (ref 0.44–1.00)
GFR, Estimated: 50 mL/min — ABNORMAL LOW (ref >60)
Glucose, Bld: 170 mg/dL — ABNORMAL HIGH (ref 70–99)
Potassium: 3.5 mmol/L (ref 3.5–5.1)
Sodium: 138 mmol/L (ref 135–145)

## 2022-12-01 MED ORDER — SODIUM CHLORIDE 0.9 % IV SOLN: INTRAVENOUS | Status: DC

## 2022-12-01 MED ORDER — IPRATROPIUM-ALBUTEROL 0.5-2.5 (3) MG/3ML IN SOLN: 3.0000 mL | Freq: Four times a day (QID) | RESPIRATORY_TRACT | Status: DC | PRN

## 2022-12-01 NOTE — Evaluation (Signed)
Physical Therapy Evaluation Patient Details Name: Susan Collins MRN: [id number] DOB: 05/10/1946 Today's Date: 12/01/2022  History of Present Illness  77 yo WF who presented after 2-3 days of SOB and hypoxia. Pt recently moved to [facility] for memory care. Chest x-ray demonstrated right lower lobe pneumonia. PMHx: COPD, dementia, hyperlipidemia presents to the ER today with reported hypoxia and cough.  Clinical Impression   Pt presents with generalized weakness, impaired balance with history of falls, decreased activity tolerance, and dyspnea on exertion with + coughing during mobility. Pt to benefit from acute PT to address deficits. Pt ambulated short room distance with RW and min +2 assist for steadying, pt with posterior and L lateral bias with no pt awareness. Pt is a high fall risk and is deconditioned vs baseline, recommending HH services at ALF. PT to progress mobility as tolerated, and will continue to follow acutely.         Recommendations for follow up therapy are one component of a multi-disciplinary discharge planning process, led by the attending physician.  Recommendations may be updated based on patient status, additional functional criteria and insurance authorization.  Follow Up Recommendations Home health PT (at ALF, with increased support from staff as needed)      Assistance Recommended at Discharge Frequent or constant Supervision/Assistance  Patient can return home with the following  A little help with walking and/or transfers;A little help with bathing/dressing/bathroom    Equipment Recommendations None recommended by PT  Recommendations for Other Services       Functional Status Assessment Patient has had a recent decline in their functional status and demonstrates the ability to make significant improvements in function in a reasonable and predictable amount of time.     Precautions / Restrictions Precautions Precautions: Fall Precaution Comments: watch  O2 Restrictions Weight Bearing Restrictions: No      Mobility  Bed Mobility Overal bed mobility: Needs Assistance Bed Mobility: Supine to Sit     Supine to sit: Mod assist, +2 for safety/equipment, +2 for physical assistance     General bed mobility comments: fear of falling and R hip pain, assist for trunk elevation and scooting hips to EOB    Transfers Overall transfer level: Needs assistance Equipment used: Rolling walker (2 wheels) Transfers: Sit to/from Stand Sit to Stand: Min assist, +2 physical assistance, +2 safety/equipment           General transfer comment: min A to stand, min-mod A to mobilize with RW. Pt with posterior/L bias    Ambulation/Gait Ambulation/Gait assistance: Min assist, +2 physical assistance Gait Distance (Feet): 15 Feet Assistive device: Rolling walker (2 wheels) Gait Pattern/deviations: Step-through pattern, Trunk flexed, Leaning posteriorly Gait velocity: decr     General Gait Details: posterior and L lateral bias, assist to steady and guide pt/RW around the room  Stairs            Wheelchair Mobility    Modified Rankin (Stroke Patients Only)       Balance Overall balance assessment: Needs assistance, History of Falls Sitting-balance support: Feet supported Sitting balance-Leahy Scale: Fair     Standing balance support: Bilateral upper extremity supported, During functional activity, Reliant on assistive device for balance Standing balance-Leahy Scale: Poor                               Pertinent Vitals/Pain Pain Assessment Pain Assessment: Faces Faces Pain Scale: Hurts little more Pain Location: R  hip, thigh with movement Pain Descriptors / Indicators: Discomfort Pain Intervention(s): Limited activity within patient's tolerance, Monitored during session, Repositioned    Home Living Family/patient expects to be discharged to:: Assisted living                 Home Equipment: Conservation officer, nature (2  wheels);Shower seat Additional Comments: Pt recently moved to [facility], memory care unit. Per pt's daughter pt has access to all needed DME and staff checks in every 2 hours. Pt has a roommate.    Prior Function Prior Level of Function : Needs assist             Mobility Comments: RW vs WC for mobility, 2 falls in 09/2022, no falls since transition to memory care. ADLs Comments: Per pt's daughter - staff assists with all ADL, mobility and IADLs. assist for weakness and baseline dementia     Hand Dominance   Dominant Hand: Right    Extremity/Trunk Assessment   Upper Extremity Assessment Upper Extremity Assessment: Defer to OT evaluation    Lower Extremity Assessment Lower Extremity Assessment: Generalized weakness    Cervical / Trunk Assessment Cervical / Trunk Assessment: Kyphotic  Communication   Communication: HOH  Cognition Arousal/Alertness: Awake/alert Behavior During Therapy: Anxious Overall Cognitive Status: History of cognitive impairments - at baseline                                 General Comments: hx of dementia, apparent fear of falling. pt reports she is a "nervous person"        General Comments General comments (skin integrity, edema, etc.): 2LO2 doffed to walk around the bed, redonned post-gait    Exercises     Assessment/Plan    PT Assessment Patient needs continued PT services  PT Problem List Decreased strength;Decreased mobility;Decreased safety awareness;Decreased activity tolerance;Decreased balance;Decreased cognition;Decreased knowledge of use of DME;Pain;Cardiopulmonary status limiting activity       PT Treatment Interventions DME instruction;Therapeutic activities;Gait training;Therapeutic exercise;Patient/family education;Balance training;Functional mobility training;Neuromuscular re-education    PT Goals (Current goals can be found in the Care Plan section)  Acute Rehab PT Goals PT Goal Formulation: With  patient Time For Goal Achievement: 12/15/22 Potential to Achieve Goals: Good    Frequency Min 3X/week     Co-evaluation   Reason for Co-Treatment: Complexity of the patient's impairments (multi-system involvement);For patient/therapist safety;To address functional/ADL transfers PT goals addressed during session: Mobility/safety with mobility;Balance OT goals addressed during session: ADL's and self-care       AM-PAC PT "6 Clicks" Mobility  Outcome Measure Help needed turning from your back to your side while in a flat bed without using bedrails?: A Little Help needed moving from lying on your back to sitting on the side of a flat bed without using bedrails?: A Little Help needed moving to and from a bed to a chair (including a wheelchair)?: A Little Help needed standing up from a chair using your arms (e.g., wheelchair or bedside chair)?: A Little Help needed to walk in hospital room?: A Little Help needed climbing 3-5 steps with a railing? : A Lot 6 Click Score: 17    End of Session Equipment Utilized During Treatment: Oxygen Activity Tolerance: Patient tolerated treatment well Patient left: in chair;with call bell/phone within reach;with chair alarm set Nurse Communication: Mobility status PT Visit Diagnosis: Other abnormalities of gait and mobility (R26.89);Muscle weakness (generalized) (M62.81)    Time: [id number]  PT Time Calculation (min) (ACUTE ONLY): 17 min   Charges:   PT Evaluation $PT Eval Low Complexity: 1 Low        Alani Sabbagh S, PT DPT Acute Rehabilitation Services Pager [phone]  Office [phone]   [place] E Ruffin Pyo 12/01/2022, 10:34 AM

## 2022-12-01 NOTE — Progress Notes (Signed)
PROGRESS NOTE    Susan Collins  [id number] DOB: 10/24/1945 DOA: 11/29/2022 PCP: Sueanne Margarita, DO   Brief Narrative:  HPI: 77 yo WF with history of COPD, dementia, hyperlipidemia presents to the ER today with reported hypoxia and cough.  Patient unable to give any history review of systems.  History obtained for the patient's son Susan Collins.  Patient recently moved into [facility] (assisted living with memory care) at the end of January 2024.  She has been at the facility for about 2 weeks.  She was living at home prior to this.  Reason for admission to assisted living due to worsening dementia and falls at home.   Son states over the last 2 to 3 days, the patient had increasing shortness of breath.  He was called today due to hypoxia.  Reported room air saturations were 87%.  EMS noted patient febrile to 101.2.   On arrival to the ER,, temp 98.4 heart rate 100 blood pressure 114/45 satting 89% on room air.   Labs showed a white count of 22.4, hemotympanums 0, platelets of 267   Venous blood gas pH 7.44 pCO2 39 pO2 of 47   Sodium 133, potassium 3.6, BUN 23, creatinine 1.44   Lactic acid 2.0.   Chest x-ray demonstrated right lower lobe pneumonia.   [facility] hospitalist contacted for admission.   Echocardiogram 09/26/2022:  Left ventricle cavity is normal in size and wall thickness. Normal global  wall motion. Normal LV systolic function with EF 66%. Normal diastolic  filling pattern.  No significant valvular abnormality.  Normal right atrial pressure.     ED Course: CXR shows RLL pneumonia, WBC 22K, Scr 1.44  Assessment & Plan:   Principal Problem:   RLL pneumonia Active Problems:   Severe sepsis (HCC)   Acute respiratory failure with hypoxia (HCC)   COPD with acute exacerbation (HCC)   Normocytic anemia   Hyperlipidemia   Dementia (HCC)   AKI (acute kidney injury) ([place])   DNR (do not resuscitate)  Severe sepsis and acute hypoxic respiratory failure secondary to  right lower lobe community-acquired pneumonia: Patient meets criteria for severe sepsis based on tachypnea, leukocytosis and acute hypoxic respiratory failure.  Cultures are negative, she was saturating 97% on 2 L of oxygen, I advised RN to remove oxygen so we can see how she does on room air.  Continue Rocephin and Zithromax.  Urine antigen for streptococci negative, Legionella pending.  She will need assessment by PT OT.  AKI: Creatinine improving.  She has dementia, I am not sure how much oral intake she takes.  I will start her on gentle hydration.  Hyperlipidemia: Continue Zocor.  Normocytic anemia: Hemoglobin stable.  Acute COPD exacerbation: No wheezes on my exam, continue prednisone and DuoNeb.  Dementia/memory deficit: Patient is usually alert to self and sometimes place and sometimes months.  Daughter at the bedside verifies that this is her baseline.  Her mentation waxes and wanes.  For that reason, she was placed at long-term facility 3 weeks ago.  DVT prophylaxis: heparin injection 5,000 Units Start: 11/30/22 0600 SCDs Start: 11/29/22 2358   Code Status: DNR  Family Communication: Daughter present at bedside.  Plan of care discussed with patient's daughter over the phone.  Status is: Inpatient Remains inpatient appropriate because: Needs monitoring for another day to make sure she maintains oxygen saturation on room air and needs to be seen by PT OT.    Estimated body mass index is 30.46 kg/m as calculated from  the following:   Height as of this encounter: 5' 3"$  (1.6 m).   Weight as of this encounter: 78 kg.    Nutritional Assessment: Body mass index is 30.46 kg/m.Marland Kitchen Seen by dietician.  I agree with the assessment and plan as outlined below: Nutrition Status:        . Skin Assessment: I have examined the patient's skin and I agree with the wound assessment as performed by the wound care RN as outlined below:    Consultants:  None  Procedures:   None  Antimicrobials:  Anti-infectives (From admission, onward)    Start     Dose/Rate Route Frequency Ordered Stop   11/30/22 1700  azithromycin (ZITHROMAX) tablet 500 mg        500 mg Oral Every 24 hours 11/30/22 0808     11/29/22 1715  cefTRIAXone (ROCEPHIN) 2 g in sodium chloride 0.9 % 100 mL IVPB        2 g 200 mL/hr over 30 Minutes Intravenous Every 24 hours 11/29/22 1708 12/04/22 1714   11/29/22 1715  azithromycin (ZITHROMAX) 500 mg in sodium chloride 0.9 % 250 mL IVPB  Status:  Discontinued        500 mg 250 mL/hr over 60 Minutes Intravenous Every 24 hours 11/29/22 1708 11/30/22 [id number]         Subjective:  Patient seen and examined.  She has no complaints.  Denied any shortness of breath.  Daughter at the bedside.  Objective: Vitals:   12/01/22 0500 12/01/22 0732 12/01/22 0750 12/01/22 1031  BP:  (!) 130/44    Pulse:  79    Resp:  18    Temp:  98.2 F (36.8 C)    TempSrc:  Oral    SpO2:  97% 97% 95%  Weight: 78 kg     Height:        Intake/Output Summary (Last 24 hours) at 12/01/2022 1151 Last data filed at 12/01/2022 0523 Gross per 24 hour  Intake --  Output 400 ml  Net -400 ml    Filed Weights   11/29/22 1845 12/01/22 0500  Weight: 75 kg 78 kg    Examination:  General exam: Appears calm and comfortable  Respiratory system: Clear to auscultation. Respiratory effort normal. Cardiovascular system: S1 & S2 heard, RRR. No JVD, murmurs, rubs, gallops or clicks. No pedal edema. Gastrointestinal system: Abdomen is nondistended, soft and nontender. No organomegaly or masses felt. Normal bowel sounds heard. Central nervous system: Alert and oriented. No focal neurological deficits. Extremities: Symmetric 5 x 5 power. Skin: No rashes, lesions or ulcers.   Data Reviewed: I have personally reviewed following labs and imaging studies  CBC: Recent Labs  Lab 11/29/22 1708 11/29/22 1812 11/30/22 0208 12/01/22 0311  WBC 22.4*  --  21.2* 25.8*  NEUTROABS  19.5*  --  19.5* 23.7*  HGB 10.0* 9.2* 9.8* 8.9*  HCT 30.7* 27.0* 28.9* 26.8*  MCV 91.1  --  89.2 89.3  PLT 267  --  242 [id number]    Basic Metabolic Panel: Recent Labs  Lab 11/29/22 1708 11/29/22 1812 11/30/22 0208 12/01/22 0311  NA 133* 135 135 138  K 3.6 3.8 3.6 3.5  CL 99  --  102 104  CO2 22  --  23 25  GLUCOSE 188*  --  183* 170*  BUN 23  --  22 30*  CREATININE 1.44*  --  1.18* 1.13*  CALCIUM 8.9  --  8.4* 8.8*  MG 2.5*  --  2.5*  --  GFR: Estimated Creatinine Clearance: 40.5 mL/min (A) (by C-G formula based on SCr of 1.13 mg/dL (H)). Liver Function Tests: Recent Labs  Lab 11/29/22 1708 11/30/22 0208  AST 38 34  ALT 25 24  ALKPHOS 101 96  BILITOT 0.7 0.2*  PROT 6.9 6.3*  ALBUMIN 2.5* 2.1*    No results for input(s): "LIPASE", "AMYLASE" in the last 168 hours. No results for input(s): "AMMONIA" in the last 168 hours. Coagulation Profile: Recent Labs  Lab 11/30/22 0208  INR 1.2    Cardiac Enzymes: No results for input(s): "CKTOTAL", "CKMB", "CKMBINDEX", "TROPONINI" in the last 168 hours. BNP (last 3 results) Recent Labs    01/31/22 0900  PROBNP 44.0    HbA1C: No results for input(s): "HGBA1C" in the last 72 hours. CBG: No results for input(s): "GLUCAP" in the last 168 hours. Lipid Profile: No results for input(s): "CHOL", "HDL", "LDLCALC", "TRIG", "CHOLHDL", "LDLDIRECT" in the last 72 hours. Thyroid Function Tests: No results for input(s): "TSH", "T4TOTAL", "FREET4", "T3FREE", "THYROIDAB" in the last 72 hours. Anemia Panel: Recent Labs    11/30/22 0208  TIBC 218*  IRON 10*    Sepsis Labs: Recent Labs  Lab 11/29/22 1708 11/29/22 1908 11/30/22 0200  PROCALCITON  --   --  0.49  LATICACIDVEN 2.0* 0.9  --      Recent Results (from the past 240 hour(s))  Resp panel by RT-PCR (RSV, Flu A&B, Covid) Anterior Nasal Swab     Status: None   Collection Time: 11/29/22  5:08 PM   Specimen: Anterior Nasal Swab  Result Value Ref Range Status    SARS Coronavirus 2 by RT PCR NEGATIVE NEGATIVE Final   Influenza A by PCR NEGATIVE NEGATIVE Final   Influenza B by PCR NEGATIVE NEGATIVE Final    Comment: (NOTE) The Xpert Xpress SARS-CoV-2/FLU/RSV plus assay is intended as an aid in the diagnosis of influenza from Nasopharyngeal swab specimens and should not be used as a sole basis for treatment. Nasal washings and aspirates are unacceptable for Xpert Xpress SARS-CoV-2/FLU/RSV testing.  Fact Sheet for Patients: EntrepreneurPulse.com.au  Fact Sheet for Healthcare Providers: IncredibleEmployment.be  This test is not yet approved or cleared by the [place] FDA and has been authorized for detection and/or diagnosis of SARS-CoV-2 by FDA under an Emergency Use Authorization (EUA). This EUA will remain in effect (meaning this test can be used) for the duration of the COVID-19 declaration under Section 564(b)(1) of the Act, 21 U.S.C. section 360bbb-3(b)(1), unless the authorization is terminated or revoked.     Resp Syncytial Virus by PCR NEGATIVE NEGATIVE Final    Comment: (NOTE) Fact Sheet for Patients: EntrepreneurPulse.com.au  Fact Sheet for Healthcare Providers: IncredibleEmployment.be  This test is not yet approved or cleared by the [place] FDA and has been authorized for detection and/or diagnosis of SARS-CoV-2 by FDA under an Emergency Use Authorization (EUA). This EUA will remain in effect (meaning this test can be used) for the duration of the COVID-19 declaration under Section 564(b)(1) of the Act, 21 U.S.C. section 360bbb-3(b)(1), unless the authorization is terminated or revoked.  Performed at [facility], [place] [address] [place], [place] [address]   Blood Culture (routine x 2)     Status: None (Preliminary result)   Collection Time: 11/29/22  6:45 PM   Specimen: BLOOD LEFT FOREARM  Result Value Ref Range Status   Specimen  Description BLOOD LEFT FOREARM  Final   Special Requests   Final    BOTTLES DRAWN AEROBIC  AND ANAEROBIC Blood Culture results may not be optimal due to an inadequate volume of blood received in culture bottles   Culture   Final    NO GROWTH 2 DAYS Performed at [facility] [address], [place], [place] [address]    Report Status PENDING  Incomplete  Blood Culture (routine x 2)     Status: None (Preliminary result)   Collection Time: 11/29/22  6:46 PM   Specimen: BLOOD  Result Value Ref Range Status   Specimen Description BLOOD LEFT ANTECUBITAL  Final   Special Requests   Final    BOTTLES DRAWN AEROBIC AND ANAEROBIC Blood Culture results may not be optimal due to an inadequate volume of blood received in culture bottles   Culture   Final    NO GROWTH 2 DAYS Performed at [facility], [place] [address], [place],  [address]    Report Status PENDING  Incomplete     Radiology Studies: DG Chest Port 1 View  Result Date: 11/29/2022 CLINICAL DATA:  Questionable sepsis. EXAM: PORTABLE CHEST 1 VIEW COMPARISON:  01/29/2022 and older exams. FINDINGS: Cardiac silhouette is normal in size. No mediastinal or hilar masses. Lungs are hyperexpanded. There are interstitial and patchy airspace opacities at the lung bases, right greater than left. Possible small effusions. Mid and upper lungs are clear. No pneumothorax. Skeletal structures are demineralized, grossly intact. IMPRESSION: 1. Interstitial and hazy airspace opacities at the lung bases. Findings may reflect atelectasis, infection or a combination. No convincing pulmonary edema. Electronically Signed   By: Lajean Manes M.D.   On: 11/29/2022 17:31    Scheduled Meds:  azithromycin  500 mg Oral Q24H   heparin  5,000 Units Subcutaneous Q8H   montelukast  10 mg Oral QHS   predniSONE  40 mg Oral Q breakfast   simvastatin  40 mg Oral Daily   Continuous Infusions:  cefTRIAXone (ROCEPHIN)  IV 2 g (11/30/22 1803)     LOS: 1 day    Darliss Cheney, MD [facility]  12/01/2022, 11:51 AM   *Please note that this is a verbal dictation therefore any spelling or grammatical errors are due to the "[place]" system interpretation.  Please page via [place] and do not message via secure chat for urgent patient care matters. Secure chat can be used for non urgent patient care matters.  How to contact the [facility] Attending or Consulting provider [place] or covering provider during after hours [place], for this patient?  Check the care team in [facility] and look for a) attending/consulting TRH provider listed and b) the [facility] team listed. Page or secure chat [phone]. Log into [email or web address] and use [place]'s universal password to access. If you do not have the password, please contact the hospital operator. Locate the [facility] provider you are looking for under [facility] and page to a number that you can be directly reached. If you still have difficulty reaching the provider, please page the [facility] (Director on Call) for the Hospitalists listed on amion for assistance.

## 2022-12-01 NOTE — TOC Progression Note (Signed)
Transition of Care [facility]) - Progression Note    Patient Details  Name: Susan Collins MRN: [id number] Date of Birth: [birth year]  Transition of Care [facility]) CM/SW [place], RN Phone Number: 12/01/2022, 12:34 PM  Clinical Narrative:    CM met with the patient and daughter at the bedside to discuss transitions of care needs.  The patient was recently admitted to [facility]F for care and was diagnosed with pneumonia at the hospital.  The patient plans to return to [place] was called to discuss needs for home.  DME at the ALF includes RW, RW and nebulizer.  The daughter states that her nebulizer was broken and needs to be replaced.  Nebulizer was ordered to be co-signed by the physician.  [facility] has DME company and RN that I spoke with today Aniceto Boss, RNCM at the facility will order.  I spoke with Aniceto Boss, RNCM and [place] uses [place] agency for services at the facility.  HH orders were placed to be co-signed by MD.  [facility] was called and Claiborne Billings, RNCM at [facility] accepted.  The patient is currently on room air and does not need oxygen for home.  PCP follow up is through [place], NP was noted in the discharge instructions for hospital follow up.  [facility] plans to provide transport to home tomorrow via [place].  I will call the facility in the am to coordinate.  Bedside nursing will call report to the facility tomorrow and give discharge instructions for the facility since they will be providing care as the ALF provider.  Daughter at bedside earlier and I will update her tomorrow.   Expected Discharge Plan: [place] Barriers to Discharge: Continued Medical Work up  Expected Discharge Plan and Services   Discharge Planning Services: CM Consult Post Acute Care Choice: Durable Medical Equipment, Home Health (needs new nebulizer - HH for PT/OT) Living  arrangements for the past 2 months: [place] ([facility] at [place])                 DME Arranged: [employer] will order a new nebulizer machine for the patient)         HH Arranged: PT, OT HH Agency: [place] Date [place]: 12/01/22 Time [place]: 1232 Representative spoke with at [place]: Claiborne Billings, [place] with [facility] for provide [facility] including PT/OT   Social Determinants of Health (SDOH) Interventions SDOH Screenings   Depression (PHQ2-9): Low Risk  (08/07/2019)  Tobacco Use: Medium Risk (11/29/2022)    Readmission Risk Interventions    12/01/2022   12:34 PM  Readmission Risk Prevention Plan  Transportation Screening Complete  PCP or Specialist Appt within 5-7 Days Complete  Home Care Screening Complete  Medication Review (RN CM) Complete

## 2022-12-01 NOTE — Evaluation (Signed)
Occupational Therapy Evaluation Patient Details Name: Susan Collins MRN: [id number] DOB: 06/05/1946 Today's Date: 12/01/2022   History of Present Illness 77 yo WF who presented after 2-3 days of SOB and hypoxia. Pt recently moved to [facility] for memory care. Chest x-ray demonstrated right lower lobe pneumonia. PMHx: COPD, dementia, hyperlipidemia presents to the ER today with reported hypoxia and cough.   Clinical Impression   Susan Collins was evaluated s/p the above admission list. She requires cognitive and physical assist from staff at baseline. Per report of pt's daughter, she normally mobilizes with a RW, but has been using a WC recently, had 2 falls in December and staff assists with all ADL/IADLs. Upon evaluation she was limited by baseline dementia, fear of falling, R hip pain, posterior and L lateral bias in standing, poor activity tolerance, limited cardiopulmonary endurance and balance. Overall she required mod A +2 fro bed mobility and min-mod A +2 for transfers and mobility with RW. Notable increased in WOB after short mobility Due to the deficits listed below, she requires min A for UB ADLs and up to max A for LB ADLs. Pt will benefit from continued acute OT services. Recommend d/c back to [facility] memory care with increased support from staff and [place].      Recommendations for follow up therapy are one component of a multi-disciplinary discharge planning process, led by the attending physician.  Recommendations may be updated based on patient status, additional functional criteria and insurance authorization.   Follow Up Recommendations  Home health OT (Back to [facility] with [place] to facility and increased support from staff)     Assistance Recommended at Discharge Frequent or constant Supervision/Assistance  Patient can return home with the following A lot of help with walking and/or transfers;A lot of help with bathing/dressing/bathroom    Functional Status  Assessment  Patient has had a recent decline in their functional status and demonstrates the ability to make significant improvements in function in a reasonable and predictable amount of time.  Equipment Recommendations  None recommended by OT (Per [place], facility has necessary equipment.)       Precautions / Restrictions Precautions Precautions: Fall Precaution Comments: watch O2 Restrictions Weight Bearing Restrictions: No      Mobility Bed Mobility Overal bed mobility: Needs Assistance Bed Mobility: Supine to Sit     Supine to sit: Mod assist, +2 for safety/equipment, +2 for physical assistance     General bed mobility comments: fear of falling and R hip pain    Transfers Overall transfer level: Needs assistance Equipment used: Rolling walker (2 wheels) Transfers: Sit to/from Stand Sit to Stand: Min assist, +2 physical assistance, +2 safety/equipment           General transfer comment: min A to stand, min-mod A to mobilize with RW. Pt with posterior/L bias      Balance Overall balance assessment: Needs assistance Sitting-balance support: Feet supported Sitting balance-Leahy Scale: Fair     Standing balance support: Bilateral upper extremity supported, During functional activity Standing balance-Leahy Scale: Poor                             ADL either performed or assessed with clinical judgement   ADL Overall ADL's : Needs assistance/impaired Eating/Feeding: Supervision/ safety   Grooming: Set up;Sitting   Upper Body Bathing: Minimal assistance;Sitting   Lower Body Bathing: Maximal assistance;Sit to/from stand   Upper Body Dressing : Set up;Sitting  Lower Body Dressing: Maximal assistance;Sit to/from stand   Toilet Transfer: Minimal assistance;Stand-pivot;Ambulation;Rolling walker (2 wheels)   Toileting- Clothing Manipulation and Hygiene: Maximal assistance;Sit to/from stand       Functional mobility during ADLs: Minimal  assistance;+2 for safety/equipment;Rolling walker (2 wheels) General ADL Comments: cues for safety and to correct posterior/L lateral bias, poor activity tolerance     Vision Baseline Vision/History: 1 Wears glasses Vision Assessment?: No apparent visual deficits     Perception Perception Perception Tested?: No   Praxis Praxis Praxis tested?: Not tested    Pertinent Vitals/Pain Pain Assessment Pain Assessment: Faces Faces Pain Scale: Hurts little more Pain Location: R hip with movement Pain Descriptors / Indicators: Discomfort Pain Intervention(s): Monitored during session, Limited activity within patient's tolerance     Hand Dominance Right   Extremity/Trunk Assessment Upper Extremity Assessment Upper Extremity Assessment: Generalized weakness   Lower Extremity Assessment Lower Extremity Assessment: Generalized weakness   Cervical / Trunk Assessment Cervical / Trunk Assessment: Kyphotic   Communication Communication Communication: HOH   Cognition Arousal/Alertness: Awake/alert Behavior During Therapy: Anxious Overall Cognitive Status: History of cognitive impairments - at baseline                                 General Comments: hx of dementia, apparent fear of falling. pt reports she is a "nervous person"     General Comments  Increase WOB with short mobility, daughter present and supportive            Home Living Family/patient expects to be discharged to:: Assisted living                             Home Equipment: Rolling Walker (2 wheels);Shower seat   Additional Comments: Pt recently moved to [facility], memory care. Per pt's daughter pt has access to all needed DME and staff checks in every 2 hours. Pt has a roommate.      Prior Functioning/Environment Prior Level of Function : Needs assist             Mobility Comments: RW vs WC for mobility, 2 falls in 09/2022, no falls since transition to memory care. ADLs  Comments: Per pt's daughter - staff assists with all ADL, mobility and IADLs. assist for weakness and baseline dementia        OT Problem List: Decreased strength;Decreased range of motion;Decreased activity tolerance;Impaired balance (sitting and/or standing);Decreased cognition;Decreased safety awareness;Decreased knowledge of precautions;Decreased knowledge of use of DME or AE      OT Treatment/Interventions: Therapeutic exercise;Self-care/ADL training;DME and/or AE instruction;Therapeutic activities;Patient/family education;Balance training    OT Goals(Current goals can be found in the care plan section) Acute Rehab OT Goals Patient Stated Goal: home OT Goal Formulation: With patient Time For Goal Achievement: 12/15/22 Potential to Achieve Goals: Good ADL Goals Pt Will Perform Grooming: sitting Pt Will Perform Upper Body Dressing: with set-up;sitting Pt Will Perform Lower Body Dressing: with mod assist;sit to/from stand Pt Will Transfer to Toilet: with min assist;ambulating Additional ADL Goal #1: Pt will complete bed mobility with supervision A as a precursor to ADLs  OT Frequency: Min 2X/week    Co-evaluation PT/OT/SLP Co-Evaluation/Treatment: Yes Reason for Co-Treatment: Complexity of the patient's impairments (multi-system involvement);For patient/therapist safety;To address functional/ADL transfers   OT goals addressed during session: ADL's and self-care      AM-PAC OT "6 Clicks" Daily Activity  Outcome Measure Help from another person eating meals?: A Little Help from another person taking care of personal grooming?: A Little Help from another person toileting, which includes using toliet, bedpan, or urinal?: A Lot Help from another person bathing (including washing, rinsing, drying)?: A Lot Help from another person to put on and taking off regular upper body clothing?: A Little Help from another person to put on and taking off regular lower body clothing?: A Lot 6  Click Score: 15   End of Session Equipment Utilized During Treatment: Rolling walker (2 wheels);Oxygen Nurse Communication: Mobility status  Activity Tolerance: Patient limited by fatigue Patient left: in chair;with call bell/phone within reach;with chair alarm set;with family/visitor present  OT Visit Diagnosis: Unsteadiness on feet (R26.81);Other abnormalities of gait and mobility (R26.89);Repeated falls (R29.6);Muscle weakness (generalized) (M62.81);History of falling (Z91.81)                Time: [id number] OT Time Calculation (min): 30 min Charges:  OT General Charges $OT Visit: 1 Visit OT Evaluation $OT Eval Moderate Complexity: 1 Mod  Shade Flood, OTR/L [place] Office [place] Communication Preferred   Elliot Cousin 12/01/2022, 9:48 AM

## 2022-12-01 NOTE — Progress Notes (Signed)
    Durable Medical Equipment  (From admission, onward)           Start     Ordered   12/01/22 1207  For home use only DME Nebulizer machine  Once       Question Answer Comment  Patient needs a nebulizer to treat with the following condition COPD (chronic obstructive pulmonary disease) ([place])   Length of Need Lifetime      12/01/22 1207

## 2022-12-02 DIAGNOSIS — J189 Pneumonia, unspecified organism: Secondary | ICD-10-CM | POA: Diagnosis not present

## 2022-12-02 LAB — BASIC METABOLIC PANEL
Anion gap: 8 (ref 5–15)
BUN: 29 mg/dL — ABNORMAL HIGH (ref 8–23)
CO2: 25 mmol/L (ref 22–32)
Calcium: 9.1 mg/dL (ref 8.9–10.3)
Chloride: 103 mmol/L (ref 98–111)
Creatinine, Ser: 0.97 mg/dL (ref 0.44–1.00)
GFR, Estimated: 60 mL/min — ABNORMAL LOW (ref >60)
Glucose, Bld: 117 mg/dL — ABNORMAL HIGH (ref 70–99)
Potassium: 3.5 mmol/L (ref 3.5–5.1)
Sodium: 136 mmol/L (ref 135–145)

## 2022-12-02 LAB — CBC WITH DIFFERENTIAL/PLATELET
Abs Immature Granulocytes: 0 10*3/uL (ref 0.00–0.07)
Basophils Absolute: 0 10*3/uL (ref 0.0–0.1)
Basophils Relative: 0 %
Eosinophils Absolute: 0 10*3/uL (ref 0.0–0.5)
Eosinophils Relative: 0 %
HCT: 29.5 % — ABNORMAL LOW (ref 36.0–46.0)
Hemoglobin: 9.9 g/dL — ABNORMAL LOW (ref 12.0–15.0)
Lymphocytes Relative: 7 %
Lymphs Abs: 1.6 10*3/uL (ref 0.7–4.0)
MCH: 29.7 pg (ref 26.0–34.0)
MCHC: 33.6 g/dL (ref 30.0–36.0)
MCV: 88.6 fL (ref 80.0–100.0)
Monocytes Absolute: 0.9 10*3/uL (ref 0.1–1.0)
Monocytes Relative: 4 %
Neutro Abs: 20.1 10*3/uL — ABNORMAL HIGH (ref 1.7–7.7)
Neutrophils Relative %: 89 %
Platelets: 343 10*3/uL (ref 150–400)
RBC: 3.33 MIL/uL — ABNORMAL LOW (ref 3.87–5.11)
RDW: 14 % (ref 11.5–15.5)
WBC: 22.6 10*3/uL — ABNORMAL HIGH (ref 4.0–10.5)
nRBC: 0 % (ref 0.0–0.2)
nRBC: 0 /100 WBC

## 2022-12-02 LAB — LEGIONELLA PNEUMOPHILA SEROGP 1 UR AG: L. pneumophila Serogp 1 Ur Ag: NEGATIVE

## 2022-12-02 MED ORDER — CEFDINIR 300 MG PO CAPS: 300.0000 mg | ORAL_CAPSULE | Freq: Two times a day (BID) | ORAL | 0 refills | Status: DC

## 2022-12-02 MED ORDER — CEFDINIR 300 MG PO CAPS: 300.0000 mg | ORAL_CAPSULE | Freq: Two times a day (BID) | ORAL | 0 refills | Status: AC

## 2022-12-02 MED ORDER — PREDNISONE 20 MG PO TABS: 40.0000 mg | ORAL_TABLET | Freq: Every day | ORAL | 0 refills | Status: AC

## 2022-12-02 MED ORDER — PREDNISONE 20 MG PO TABS: 40.0000 mg | ORAL_TABLET | Freq: Every day | ORAL | 0 refills | Status: DC

## 2022-12-02 NOTE — Progress Notes (Signed)
Mobility Specialist - Progress Note   12/02/22 1001  Mobility  Activity Dangled on edge of bed  Level of Assistance Moderate assist, patient does 50-74%  Assistive Device None  Activity Response Tolerated poorly  Mobility Referral Yes  $Mobility charge 1 Mobility   Pt was received in bed and agreeable to get up in chair. Throughout session pt progressively go more upset stating that she didn't know where her granddaughter was and that she needed to go find her. Pt started crying once at EOB. Further mobility was deferred at this time. Pt was returned to supine with all needs met and RN notified.   Franki Monte  Mobility Specialist Please contact via Solicitor or Rehab office at [phone]

## 2022-12-02 NOTE — Discharge Summary (Signed)
Physician Discharge Summary  Susan Collins [id number] DOB: [birth year] DOA: 11/29/2022  PCP: Sueanne Margarita, DO  Admit date: 11/29/2022 Discharge date: 12/02/2022 30 Day Unplanned Readmission Risk Score    Flowsheet Row ED to Hosp-Admission (Current) from 11/29/2022 in [facility]  30 Day Unplanned Readmission Risk Score (%) 17.67 Filed at 12/02/2022 0801       This score is the patient's risk of an unplanned readmission within 30 days of being discharged (0 -100%). The score is based on dignosis, age, lab data, medications, orders, and past utilization.   Low:  0-14.9   Medium: 15-21.9   High: 22-29.9   Extreme: 30 and above          Admitted From: Assisted living facility Disposition: Assisted living facility  Recommendations for Outpatient Follow-up:  Follow up with PCP in 1-2 weeks Please obtain BMP/CBC in one week Please follow up with your PCP on the following pending results: Unresulted Labs (From admission, onward)     Start     Ordered   12/02/22 [id number]  Basic metabolic panel  ONCE - URGENT,   URGENT        12/02/22 0950   11/29/22 2358  Legionella Pneumophila Serogp 1 Ur Ag  (COPD / Pneumonia / Cellulitis / Lower Extremity Wound)  ONCE - URGENT,   URGENT        11/29/22 2357              Home Health: Yes Equipment/Devices: None  Discharge Condition: Stable CODE STATUS: DNR Diet recommendation: Cardiac  Subjective: Patient seen and examined.  She has no complaints.  Brief/Interim Summary: 77 yo WF with history of COPD, dementia, hyperlipidemia presented to the ER with reported hypoxia and cough.  Patient unable to give any history review of systems due to dementia.  History obtained for the patient's son [name].  Patient recently moved into [facility] (assisted living with memory care) at the end of January 2024.  She has been at the facility for about 2 weeks.  She was living at home prior to this.  Reason for admission to assisted  living due to worsening dementia and falls at home. Son states over the last 2 to 3 days, the patient had increasing shortness of breath.  He was called today due to hypoxia.  Reported room air saturations were 87%.  EMS noted patient febrile to 101.2.  On arrival to the ER,, temp 98.4 heart rate 100 blood pressure 114/45 satting 89% on room air.  Labs showed a white count of 22.4, hemotympanums 0, platelets of 267  Venous blood gas pH 7.44 pCO2 39 pO2 of 47 Chest x-ray demonstrated right lower lobe pneumonia.  She was admitted under hospitalist service for further management.  Details below.   Severe sepsis and acute hypoxic respiratory failure secondary to right lower lobe community-acquired pneumonia: Patient meets criteria for severe sepsis based on tachypnea, leukocytosis and acute hypoxic respiratory failure.  Cultures are negative, she was weaned to room air yesterday and has been saturating over 94%.  She is asymptomatic and stable for discharge.  She will be discharged on 3 more days of cefdinir.   AKI: Resolved.   Hyperlipidemia: Continue Zocor.   Normocytic anemia: Hemoglobin stable.   Acute COPD exacerbation: No wheezes on my exam, continue prednisone and DuoNeb.  Will be discharged on 3 more days of prednisone.   Dementia/memory deficit: Patient is usually alert to self and sometimes place and sometimes months.  Daughter at the bedside verifies that this is her baseline.  Her mentation waxes and wanes.  Discharge plan was discussed with patient and/or family member and they verbalized understanding and agreed with it.  Discharge Diagnoses:  Principal Problem:   RLL pneumonia Active Problems:   Severe sepsis ([place])   Acute respiratory failure with hypoxia (HCC)   COPD with acute exacerbation (HCC)   Normocytic anemia   Hyperlipidemia   Dementia (HCC)   AKI (acute kidney injury) ([place])   DNR (do not resuscitate)    Discharge Instructions   Allergies as of 12/02/2022        Reactions   Sulfonamide Derivatives    REACTION: headache        Medication List     STOP taking these medications    cephALEXin 500 MG capsule Commonly known as: KEFLEX       TAKE these medications    acetaminophen 650 MG CR tablet Commonly known as: TYLENOL Take 1,300 mg by mouth every 8 (eight) hours as needed for pain.   albuterol (2.5 MG/3ML) 0.083% nebulizer solution Commonly known as: PROVENTIL TAKE 3 MLS BY NEBULIZATION EVERY 6 (SIX) HOURS AS NEEDED FOR WHEEZING OR SHORTNESS OF BREATH. What changed:  how much to take how to take this when to take this reasons to take this   albuterol 108 (90 Base) MCG/ACT inhaler Commonly known as: Ventolin HFA INHALE TWO PUFFS BY MOUTH EVERY 6 HOURS AS NEEDED What changed:  how much to take how to take this when to take this reasons to take this   ammonium lactate 12 % lotion Commonly known as: AmLactin Apply 1 application. topically as needed for dry skin.   calcium carbonate 1250 (500 Ca) MG tablet Commonly known as: OS-CAL - dosed in mg of elemental calcium Take 1 tablet by mouth daily.   cefdinir 300 MG capsule Commonly known as: OMNICEF Take 1 capsule (300 mg total) by mouth 2 (two) times daily for 4 days.   furosemide 20 MG tablet Commonly known as: LASIX Take 40 mg by mouth daily.   ibandronate 150 MG tablet Commonly known as: BONIVA Take 150 mg by mouth every 30 (thirty) days.   montelukast 10 MG tablet Commonly known as: SINGULAIR Take 1 tablet (10 mg total) by mouth at bedtime.   nystatin powder Generic drug: nystatin Apply 1 Application topically 2 (two) times daily.   polyethylene glycol 17 g packet Commonly known as: MIRALAX / GLYCOLAX Take 17 g by mouth daily.   predniSONE 20 MG tablet Commonly known as: DELTASONE Take 2 tablets (40 mg total) by mouth daily with breakfast for 3 days. Start taking on: December 03, 2022   simvastatin 40 MG tablet Commonly known as: ZOCOR Take 1  tablet (40 mg total) by mouth daily.   Trelegy Ellipta 100-62.5-25 MCG/ACT Aepb Generic drug: Fluticasone-Umeclidin-Vilant Inhale 1 puff into the lungs daily.   vitamin C 1000 MG tablet Take 1,000 mg by mouth daily.   Vitamin D3 25 MCG (1000 UT) capsule Generic drug: Cholecalciferol Take 1,000 Units by mouth daily.               Durable Medical Equipment  (From admission, onward)           Start     Ordered   12/01/22 1207  For home use only DME Nebulizer machine  Once       Question Answer Comment  Patient needs a nebulizer to treat with the following condition COPD (  chronic obstructive pulmonary disease) (HCC)   Length of Need Lifetime      12/01/22 1207            Follow-up Information     Kurth-Bowen, Cornelia, PA-C Follow up.   Specialty: Physician Assistant Why: Ceasar Lund, NP will follow up with you at [facility] when you return to the facility for care. Contact information: [place] [address] [phone]         [facility] Follow up.   Specialty: Home Health Services Why: [facility] will be providing home health services.  They will call [facility] to set up services. Contact information: [address] [place] [address] [phone]         [facility] Follow up.   Specialty: [place] Why: You will be discharged back to [facility] for care. Contact information: [place]        Sueanne Margarita, DO Follow up in 1 week(s).   Specialty: Internal Medicine Contact information: [place] [address] [phone]                Allergies  Allergen Reactions   Sulfonamide Derivatives     REACTION: headache    Consultations: None   Procedures/Studies: DG Chest Port 1 View  Result Date: 11/29/2022 CLINICAL DATA:  Questionable sepsis. EXAM: PORTABLE  CHEST 1 VIEW COMPARISON:  01/29/2022 and older exams. FINDINGS: Cardiac silhouette is normal in size. No mediastinal or hilar masses. Lungs are hyperexpanded. There are interstitial and patchy airspace opacities at the lung bases, right greater than left. Possible small effusions. Mid and upper lungs are clear. No pneumothorax. Skeletal structures are demineralized, grossly intact. IMPRESSION: 1. Interstitial and hazy airspace opacities at the lung bases. Findings may reflect atelectasis, infection or a combination. No convincing pulmonary edema. Electronically Signed   By: Lajean Manes M.D.   On: 11/29/2022 17:31     Discharge Exam: Vitals:   12/02/22 0443 12/02/22 0720  BP: (!) 137/49 (!) 138/51  Pulse: 76 75  Resp: 17 18  Temp: 98.2 F (36.8 C) 97.7 F (36.5 C)  SpO2: 94% 93%   Vitals:   12/01/22 2011 12/02/22 0443 12/02/22 0637 12/02/22 0720  BP: 113/74 (!) 137/49  (!) 138/51  Pulse: 81 76  75  Resp: 17 17  18  $ Temp: 97.8 F (36.6 C) 98.2 F (36.8 C)  97.7 F (36.5 C)  TempSrc: Oral Oral  Oral  SpO2: 96% 94%  93%  Weight:   81.6 kg   Height:        General: Pt is alert, awake, not in acute distress Cardiovascular: RRR, S1/S2 +, no rubs, no gallops Respiratory: CTA bilaterally, no wheezing, no rhonchi Abdominal: Soft, NT, ND, bowel sounds + Extremities: no edema, no cyanosis    The results of significant diagnostics from this hospitalization (including imaging, microbiology, ancillary and laboratory) are listed below for reference.     Microbiology: Recent Results (from the past 240 hour(s))  Resp panel by RT-PCR (RSV, Flu A&B, Covid) Anterior Nasal Swab     Status: None   Collection Time: 11/29/22  5:08 PM   Specimen: Anterior Nasal Swab  Result Value Ref Range Status   SARS Coronavirus 2 by RT PCR NEGATIVE NEGATIVE Final   Influenza A by PCR NEGATIVE NEGATIVE Final   Influenza B by PCR NEGATIVE NEGATIVE Final    Comment: (NOTE)  The Xpert Xpress  SARS-CoV-2/FLU/RSV plus assay is intended as an aid in the diagnosis of influenza from Nasopharyngeal swab specimens and should not be used as a sole basis for treatment. Nasal washings and aspirates are unacceptable for Xpert Xpress SARS-CoV-2/FLU/RSV testing.  Fact Sheet for Patients: EntrepreneurPulse.com.au  Fact Sheet for Healthcare Providers: IncredibleEmployment.be  This test is not yet approved or cleared by the [place] FDA and has been authorized for detection and/or diagnosis of SARS-CoV-2 by FDA under an Emergency Use Authorization (EUA). This EUA will remain in effect (meaning this test can be used) for the duration of the COVID-19 declaration under Section 564(b)(1) of the Act, 21 U.S.C. section 360bbb-3(b)(1), unless the authorization is terminated or revoked.     Resp Syncytial Virus by PCR NEGATIVE NEGATIVE Final    Comment: (NOTE) Fact Sheet for Patients: EntrepreneurPulse.com.au  Fact Sheet for Healthcare Providers: IncredibleEmployment.be  This test is not yet approved or cleared by the [place] FDA and has been authorized for detection and/or diagnosis of SARS-CoV-2 by FDA under an Emergency Use Authorization (EUA). This EUA will remain in effect (meaning this test can be used) for the duration of the COVID-19 declaration under Section 564(b)(1) of the Act, 21 U.S.C. section 360bbb-3(b)(1), unless the authorization is terminated or revoked.  Performed at [facility], [place] [address] [place], [place] [address]   Blood Culture (routine x 2)     Status: None (Preliminary result)   Collection Time: 11/29/22  6:45 PM   Specimen: BLOOD LEFT FOREARM  Result Value Ref Range Status   Specimen Description BLOOD LEFT FOREARM  Final   Special Requests   Final    BOTTLES DRAWN AEROBIC AND ANAEROBIC Blood Culture results may not be optimal due to an inadequate volume of blood  received in culture bottles   Culture   Final    NO GROWTH 3 DAYS Performed at [facility], [place] [address], [place], [place] [address]    Report Status PENDING  Incomplete  Blood Culture (routine x 2)     Status: None (Preliminary result)   Collection Time: 11/29/22  6:46 PM   Specimen: BLOOD  Result Value Ref Range Status   Specimen Description BLOOD LEFT ANTECUBITAL  Final   Special Requests   Final    BOTTLES DRAWN AEROBIC AND ANAEROBIC Blood Culture results may not be optimal due to an inadequate volume of blood received in culture bottles   Culture   Final    NO GROWTH 3 DAYS Performed at [facility], [place] [address], [place],  [address]    Report Status PENDING  Incomplete     Labs: BNP (last 3 results) Recent Labs    11/29/22 1708  BNP [id number]*   Basic Metabolic Panel: Recent Labs  Lab 11/29/22 1708 11/29/22 1812 11/30/22 0208 12/01/22 0311  NA 133* 135 135 138  K 3.6 3.8 3.6 3.5  CL 99  --  102 104  CO2 22  --  23 25  GLUCOSE 188*  --  183* 170*  BUN 23  --  22 30*  CREATININE 1.44*  --  1.18* 1.13*  CALCIUM 8.9  --  8.4* 8.8*  MG 2.5*  --  2.5*  --    Liver Function Tests: Recent Labs  Lab 11/29/22 1708 11/30/22 0208  AST 38 34  ALT 25 24  ALKPHOS 101 96  BILITOT 0.7 0.2*  PROT 6.9 6.3*  ALBUMIN 2.5* 2.1*  No results for input(s): "LIPASE", "AMYLASE" in the last 168 hours. No results for input(s): "AMMONIA" in the last 168 hours. CBC: Recent Labs  Lab 11/29/22 1708 11/29/22 1812 11/30/22 0208 12/01/22 0311 12/02/22 1044  WBC 22.4*  --  21.2* 25.8* 22.6*  NEUTROABS 19.5*  --  19.5* 23.7* PENDING  HGB 10.0* 9.2* 9.8* 8.9* 9.9*  HCT 30.7* 27.0* 28.9* 26.8* 29.5*  MCV 91.1  --  89.2 89.3 88.6  PLT 267  --  242 295 343   Cardiac Enzymes: No results for input(s): "CKTOTAL", "CKMB", "CKMBINDEX", "TROPONINI" in the last 168 hours. BNP: Invalid input(s): "POCBNP" CBG: No results for input(s): "GLUCAP" in the last 168  hours. D-Dimer No results for input(s): "DDIMER" in the last 72 hours. Hgb A1c No results for input(s): "HGBA1C" in the last 72 hours. Lipid Profile No results for input(s): "CHOL", "HDL", "LDLCALC", "TRIG", "CHOLHDL", "LDLDIRECT" in the last 72 hours. Thyroid function studies No results for input(s): "TSH", "T4TOTAL", "T3FREE", "THYROIDAB" in the last 72 hours.  Invalid input(s): "FREET3" Anemia work up Recent Labs    11/30/22 0208  TIBC 218*  IRON 10*   Urinalysis    Component Value Date/Time   COLORURINE YELLOW 11/30/2022 0855   APPEARANCEUR CLEAR 11/30/2022 0855   LABSPEC 1.025 11/30/2022 0855   PHURINE 5.5 11/30/2022 0855   GLUCOSEU NEGATIVE 11/30/2022 0855   HGBUR NEGATIVE 11/30/2022 0855   BILIRUBINUR NEGATIVE 11/30/2022 0855   KETONESUR NEGATIVE 11/30/2022 0855   PROTEINUR 30 (A) 11/30/2022 0855   NITRITE NEGATIVE 11/30/2022 0855   LEUKOCYTESUR NEGATIVE 11/30/2022 0855   Sepsis Labs Recent Labs  Lab 11/29/22 1708 11/30/22 0208 12/01/22 0311 12/02/22 1044  WBC 22.4* 21.2* 25.8* 22.6*   Microbiology Recent Results (from the past 240 hour(s))  Resp panel by RT-PCR (RSV, Flu A&B, Covid) Anterior Nasal Swab     Status: None   Collection Time: 11/29/22  5:08 PM   Specimen: Anterior Nasal Swab  Result Value Ref Range Status   SARS Coronavirus 2 by RT PCR NEGATIVE NEGATIVE Final   Influenza A by PCR NEGATIVE NEGATIVE Final   Influenza B by PCR NEGATIVE NEGATIVE Final    Comment: (NOTE) The Xpert Xpress SARS-CoV-2/FLU/RSV plus assay is intended as an aid in the diagnosis of influenza from Nasopharyngeal swab specimens and should not be used as a sole basis for treatment. Nasal washings and aspirates are unacceptable for Xpert Xpress SARS-CoV-2/FLU/RSV testing.  Fact Sheet for Patients: EntrepreneurPulse.com.au  Fact Sheet for Healthcare Providers: IncredibleEmployment.be  This test is not yet approved or cleared by the  [place] FDA and has been authorized for detection and/or diagnosis of SARS-CoV-2 by FDA under an Emergency Use Authorization (EUA). This EUA will remain in effect (meaning this test can be used) for the duration of the COVID-19 declaration under Section 564(b)(1) of the Act, 21 U.S.C. section 360bbb-3(b)(1), unless the authorization is terminated or revoked.     Resp Syncytial Virus by PCR NEGATIVE NEGATIVE Final    Comment: (NOTE) Fact Sheet for Patients: EntrepreneurPulse.com.au  Fact Sheet for Healthcare Providers: IncredibleEmployment.be  This test is not yet approved or cleared by the [place] FDA and has been authorized for detection and/or diagnosis of SARS-CoV-2 by FDA under an Emergency Use Authorization (EUA). This EUA will remain in effect (meaning this test can be used) for the duration of the COVID-19 declaration under Section 564(b)(1) of the Act, 21 U.S.C. section 360bbb-3(b)(1), unless the authorization is terminated or revoked.  Performed at [facility], [place] [address] [place], [place] [address]   Blood Culture (routine x 2)     Status: None (Preliminary result)   Collection Time: 11/29/22  6:45 PM   Specimen: BLOOD LEFT FOREARM  Result Value Ref Range Status   Specimen Description BLOOD LEFT FOREARM  Final   Special Requests   Final    BOTTLES DRAWN AEROBIC AND ANAEROBIC Blood Culture results may not be optimal due to an inadequate volume of blood received in culture bottles   Culture   Final    NO GROWTH 3 DAYS Performed at [facility], [place] [address], [place], [place] [address]    Report Status PENDING  Incomplete  Blood Culture (routine x 2)     Status: None (Preliminary result)   Collection Time: 11/29/22  6:46 PM   Specimen: BLOOD  Result Value Ref Range Status   Specimen Description BLOOD LEFT ANTECUBITAL  Final   Special Requests   Final    BOTTLES DRAWN AEROBIC AND ANAEROBIC Blood  Culture results may not be optimal due to an inadequate volume of blood received in culture bottles   Culture   Final    NO GROWTH 3 DAYS Performed at [facility], [place] [address] [place],  [address]    Report Status PENDING  Incomplete     Time coordinating discharge: Over 30 minutes  SIGNED:   Darliss Cheney, MD  [facility] 12/02/2022, 11:34 AM *Please note that this is a verbal dictation therefore any spelling or grammatical errors are due to the "[employer]" system interpretation. If 7PM-7AM, please contact night-coverage [email or web address]

## 2022-12-02 NOTE — Plan of Care (Signed)
  Problem: Education: Goal: Knowledge of disease or condition will improve Outcome: Adequate for Discharge Goal: Knowledge of the prescribed therapeutic regimen will improve Outcome: Adequate for Discharge Goal: Individualized Educational Video(s) Outcome: Adequate for Discharge   Problem: Education: Goal: Knowledge of disease or condition will improve Outcome: Adequate for Discharge Goal: Knowledge of the prescribed therapeutic regimen will improve Outcome: Adequate for Discharge Goal: Individualized Educational Video(s) Outcome: Adequate for Discharge   Problem: Activity: Goal: Ability to tolerate increased activity will improve Outcome: Adequate for Discharge Goal: Will verbalize the importance of balancing activity with adequate rest periods Outcome: Adequate for Discharge   Problem: Respiratory: Goal: Ability to maintain a clear airway will improve Outcome: Adequate for Discharge Goal: Levels of oxygenation will improve Outcome: Adequate for Discharge Goal: Ability to maintain adequate ventilation will improve Outcome: Adequate for Discharge   Problem: Activity: Goal: Ability to tolerate increased activity will improve Outcome: Adequate for Discharge   Problem: Clinical Measurements: Goal: Ability to maintain a body temperature in the normal range will improve Outcome: Adequate for Discharge   Problem: Respiratory: Goal: Ability to maintain adequate ventilation will improve Outcome: Adequate for Discharge Goal: Ability to maintain a clear airway will improve Outcome: Adequate for Discharge   Problem: Education: Goal: Knowledge of General Education information will improve Description: Including pain rating scale, medication(s)/side effects and non-pharmacologic comfort measures Outcome: Adequate for Discharge   Problem: Health Behavior/Discharge Planning: Goal: Ability to manage health-related needs will improve Outcome: Adequate for Discharge   Problem:  Clinical Measurements: Goal: Ability to maintain clinical measurements within normal limits will improve Outcome: Adequate for Discharge Goal: Will remain free from infection Outcome: Adequate for Discharge Goal: Diagnostic test results will improve Outcome: Adequate for Discharge Goal: Respiratory complications will improve Outcome: Adequate for Discharge Goal: Cardiovascular complication will be avoided Outcome: Adequate for Discharge   Problem: Activity: Goal: Risk for activity intolerance will decrease Outcome: Adequate for Discharge   Problem: Coping: Goal: Level of anxiety will decrease Outcome: Adequate for Discharge   Problem: Elimination: Goal: Will not experience complications related to bowel motility Outcome: Adequate for Discharge Goal: Will not experience complications related to urinary retention Outcome: Adequate for Discharge   Problem: Pain Managment: Goal: General experience of comfort will improve Outcome: Adequate for Discharge   Problem: Safety: Goal: Ability to remain free from injury will improve Outcome: Adequate for Discharge   Problem: Skin Integrity: Goal: Risk for impaired skin integrity will decrease Outcome: Adequate for Discharge

## 2022-12-02 NOTE — TOC Transition Note (Signed)
Transition of Care [facility]) - CM/SW Discharge Note   Patient Details  Name: Susan Collins MRN: [id number] Date of Birth: 1946-03-24  [facility] CM/SW Contact:  Jinger Neighbors, LCSW Phone Number: 12/02/2022, 12:15 PM   Clinical Narrative:     CSW called Maudie Mercury at [facility] to request transportation for pt. CSW asked for pt's wheelchair and clothing be brought to pick pt up at her room.  RNCM adding HH orders to AVS packet.   Final next level of care: Assisted Living Barriers to Discharge: Continued Medical Work up   Patient Goals and CMS Choice CMS Medicare.gov Compare Post Acute Care list provided to:: Patient Represenative (must comment) (Patient's daughter at bedside) Choice offered to / list presented to : Adult Children  Discharge Placement                         Discharge Plan and Services Additional resources added to the After Visit Summary for     Discharge Planning Services: CM Consult Post Acute Care Choice: Durable Medical Equipment, Home Health (needs new nebulizer - HH for PT/OT)          DME Arranged: Nebulizer machine [employer] will order a new nebulizer machine for the patient)         HH Arranged: PT, OT HH Agency: [facility] Date [place]: 12/01/22 Time [place]: 1232 Representative spoke with at [place]: Claiborne [place], [place] with [place] for provide [facility] including PT/OT  Social Determinants of Health (SDOH) Interventions SDOH Screenings   Depression (PHQ2-9): Low Risk  (08/07/2019)  Tobacco Use: Medium Risk (11/29/2022)     Readmission Risk Interventions    12/01/2022   12:34 PM  Readmission Risk Prevention Plan  Transportation Screening Complete  PCP or Specialist Appt within 5-7 Days Complete  Home Care Screening Complete  Medication Review (RN CM) Complete

## 2022-12-03 DIAGNOSIS — D72829 Elevated white blood cell count, unspecified: Secondary | ICD-10-CM | POA: Diagnosis not present

## 2022-12-03 DIAGNOSIS — E7800 Pure hypercholesterolemia, unspecified: Secondary | ICD-10-CM | POA: Diagnosis not present

## 2022-12-03 DIAGNOSIS — J441 Chronic obstructive pulmonary disease with (acute) exacerbation: Secondary | ICD-10-CM | POA: Diagnosis not present

## 2022-12-03 DIAGNOSIS — A419 Sepsis, unspecified organism: Secondary | ICD-10-CM | POA: Diagnosis not present

## 2022-12-03 DIAGNOSIS — D649 Anemia, unspecified: Secondary | ICD-10-CM | POA: Diagnosis not present

## 2022-12-03 DIAGNOSIS — J9601 Acute respiratory failure with hypoxia: Secondary | ICD-10-CM | POA: Diagnosis not present

## 2022-12-03 DIAGNOSIS — J181 Lobar pneumonia, unspecified organism: Secondary | ICD-10-CM | POA: Diagnosis not present

## 2022-12-03 DIAGNOSIS — M810 Age-related osteoporosis without current pathological fracture: Secondary | ICD-10-CM | POA: Diagnosis not present

## 2022-12-03 DIAGNOSIS — G609 Hereditary and idiopathic neuropathy, unspecified: Secondary | ICD-10-CM | POA: Diagnosis not present

## 2022-12-03 DIAGNOSIS — F0390 Unspecified dementia without behavioral disturbance: Secondary | ICD-10-CM | POA: Diagnosis not present

## 2022-12-04 LAB — CULTURE, BLOOD (ROUTINE X 2)
Culture: NO GROWTH
Culture: NO GROWTH

## 2022-12-08 DIAGNOSIS — D72829 Elevated white blood cell count, unspecified: Secondary | ICD-10-CM | POA: Diagnosis not present

## 2022-12-08 DIAGNOSIS — J441 Chronic obstructive pulmonary disease with (acute) exacerbation: Secondary | ICD-10-CM | POA: Diagnosis not present

## 2022-12-08 DIAGNOSIS — D649 Anemia, unspecified: Secondary | ICD-10-CM | POA: Diagnosis not present

## 2022-12-08 DIAGNOSIS — G609 Hereditary and idiopathic neuropathy, unspecified: Secondary | ICD-10-CM | POA: Diagnosis not present

## 2022-12-08 DIAGNOSIS — M810 Age-related osteoporosis without current pathological fracture: Secondary | ICD-10-CM | POA: Diagnosis not present

## 2022-12-08 DIAGNOSIS — A419 Sepsis, unspecified organism: Secondary | ICD-10-CM | POA: Diagnosis not present

## 2022-12-08 DIAGNOSIS — R609 Edema, unspecified: Secondary | ICD-10-CM | POA: Diagnosis not present

## 2022-12-08 DIAGNOSIS — F01B0 Vascular dementia, moderate, without behavioral disturbance, psychotic disturbance, mood disturbance, and anxiety: Secondary | ICD-10-CM | POA: Diagnosis not present

## 2022-12-08 DIAGNOSIS — R2681 Unsteadiness on feet: Secondary | ICD-10-CM | POA: Diagnosis not present

## 2022-12-08 DIAGNOSIS — F0390 Unspecified dementia without behavioral disturbance: Secondary | ICD-10-CM | POA: Diagnosis not present

## 2022-12-08 DIAGNOSIS — M6281 Muscle weakness (generalized): Secondary | ICD-10-CM | POA: Diagnosis not present

## 2022-12-08 DIAGNOSIS — J181 Lobar pneumonia, unspecified organism: Secondary | ICD-10-CM | POA: Diagnosis not present

## 2022-12-08 DIAGNOSIS — L22 Diaper dermatitis: Secondary | ICD-10-CM | POA: Diagnosis not present

## 2022-12-08 DIAGNOSIS — J9601 Acute respiratory failure with hypoxia: Secondary | ICD-10-CM | POA: Diagnosis not present

## 2022-12-09 DIAGNOSIS — M810 Age-related osteoporosis without current pathological fracture: Secondary | ICD-10-CM | POA: Diagnosis not present

## 2022-12-09 DIAGNOSIS — F0390 Unspecified dementia without behavioral disturbance: Secondary | ICD-10-CM | POA: Diagnosis not present

## 2022-12-09 DIAGNOSIS — J441 Chronic obstructive pulmonary disease with (acute) exacerbation: Secondary | ICD-10-CM | POA: Diagnosis not present

## 2022-12-09 DIAGNOSIS — J181 Lobar pneumonia, unspecified organism: Secondary | ICD-10-CM | POA: Diagnosis not present

## 2022-12-09 DIAGNOSIS — D72829 Elevated white blood cell count, unspecified: Secondary | ICD-10-CM | POA: Diagnosis not present

## 2022-12-09 DIAGNOSIS — J9601 Acute respiratory failure with hypoxia: Secondary | ICD-10-CM | POA: Diagnosis not present

## 2022-12-09 DIAGNOSIS — D649 Anemia, unspecified: Secondary | ICD-10-CM | POA: Diagnosis not present

## 2022-12-09 DIAGNOSIS — A419 Sepsis, unspecified organism: Secondary | ICD-10-CM | POA: Diagnosis not present

## 2022-12-09 DIAGNOSIS — G609 Hereditary and idiopathic neuropathy, unspecified: Secondary | ICD-10-CM | POA: Diagnosis not present

## 2022-12-10 DIAGNOSIS — D649 Anemia, unspecified: Secondary | ICD-10-CM | POA: Diagnosis not present

## 2022-12-10 DIAGNOSIS — D72829 Elevated white blood cell count, unspecified: Secondary | ICD-10-CM | POA: Diagnosis not present

## 2022-12-10 DIAGNOSIS — F0390 Unspecified dementia without behavioral disturbance: Secondary | ICD-10-CM | POA: Diagnosis not present

## 2022-12-10 DIAGNOSIS — J181 Lobar pneumonia, unspecified organism: Secondary | ICD-10-CM | POA: Diagnosis not present

## 2022-12-10 DIAGNOSIS — J441 Chronic obstructive pulmonary disease with (acute) exacerbation: Secondary | ICD-10-CM | POA: Diagnosis not present

## 2022-12-10 DIAGNOSIS — G609 Hereditary and idiopathic neuropathy, unspecified: Secondary | ICD-10-CM | POA: Diagnosis not present

## 2022-12-10 DIAGNOSIS — M810 Age-related osteoporosis without current pathological fracture: Secondary | ICD-10-CM | POA: Diagnosis not present

## 2022-12-10 DIAGNOSIS — J9601 Acute respiratory failure with hypoxia: Secondary | ICD-10-CM | POA: Diagnosis not present

## 2022-12-10 DIAGNOSIS — A419 Sepsis, unspecified organism: Secondary | ICD-10-CM | POA: Diagnosis not present

## 2022-12-15 DIAGNOSIS — R269 Unspecified abnormalities of gait and mobility: Secondary | ICD-10-CM | POA: Diagnosis not present

## 2022-12-15 DIAGNOSIS — G609 Hereditary and idiopathic neuropathy, unspecified: Secondary | ICD-10-CM | POA: Diagnosis not present

## 2022-12-15 DIAGNOSIS — J9601 Acute respiratory failure with hypoxia: Secondary | ICD-10-CM | POA: Diagnosis not present

## 2022-12-15 DIAGNOSIS — A419 Sepsis, unspecified organism: Secondary | ICD-10-CM | POA: Diagnosis not present

## 2022-12-15 DIAGNOSIS — I868 Varicose veins of other specified sites: Secondary | ICD-10-CM | POA: Diagnosis not present

## 2022-12-15 DIAGNOSIS — M6281 Muscle weakness (generalized): Secondary | ICD-10-CM | POA: Diagnosis not present

## 2022-12-15 DIAGNOSIS — D649 Anemia, unspecified: Secondary | ICD-10-CM | POA: Diagnosis not present

## 2022-12-15 DIAGNOSIS — I872 Venous insufficiency (chronic) (peripheral): Secondary | ICD-10-CM | POA: Diagnosis not present

## 2022-12-15 DIAGNOSIS — D72829 Elevated white blood cell count, unspecified: Secondary | ICD-10-CM | POA: Diagnosis not present

## 2022-12-15 DIAGNOSIS — J441 Chronic obstructive pulmonary disease with (acute) exacerbation: Secondary | ICD-10-CM | POA: Diagnosis not present

## 2022-12-15 DIAGNOSIS — M810 Age-related osteoporosis without current pathological fracture: Secondary | ICD-10-CM | POA: Diagnosis not present

## 2022-12-15 DIAGNOSIS — F0390 Unspecified dementia without behavioral disturbance: Secondary | ICD-10-CM | POA: Diagnosis not present

## 2022-12-15 DIAGNOSIS — J181 Lobar pneumonia, unspecified organism: Secondary | ICD-10-CM | POA: Diagnosis not present

## 2022-12-15 DIAGNOSIS — R296 Repeated falls: Secondary | ICD-10-CM | POA: Diagnosis not present

## 2022-12-17 DIAGNOSIS — M810 Age-related osteoporosis without current pathological fracture: Secondary | ICD-10-CM | POA: Diagnosis not present

## 2022-12-17 DIAGNOSIS — D72829 Elevated white blood cell count, unspecified: Secondary | ICD-10-CM | POA: Diagnosis not present

## 2022-12-17 DIAGNOSIS — J181 Lobar pneumonia, unspecified organism: Secondary | ICD-10-CM | POA: Diagnosis not present

## 2022-12-17 DIAGNOSIS — A419 Sepsis, unspecified organism: Secondary | ICD-10-CM | POA: Diagnosis not present

## 2022-12-17 DIAGNOSIS — F0390 Unspecified dementia without behavioral disturbance: Secondary | ICD-10-CM | POA: Diagnosis not present

## 2022-12-17 DIAGNOSIS — D649 Anemia, unspecified: Secondary | ICD-10-CM | POA: Diagnosis not present

## 2022-12-17 DIAGNOSIS — J9601 Acute respiratory failure with hypoxia: Secondary | ICD-10-CM | POA: Diagnosis not present

## 2022-12-17 DIAGNOSIS — J441 Chronic obstructive pulmonary disease with (acute) exacerbation: Secondary | ICD-10-CM | POA: Diagnosis not present

## 2022-12-17 DIAGNOSIS — G609 Hereditary and idiopathic neuropathy, unspecified: Secondary | ICD-10-CM | POA: Diagnosis not present

## 2022-12-18 ENCOUNTER — Encounter (HOSPITAL_BASED_OUTPATIENT_CLINIC_OR_DEPARTMENT_OTHER): Payer: Self-pay | Admitting: Pulmonary Disease

## 2022-12-18 ENCOUNTER — Ambulatory Visit (HOSPITAL_BASED_OUTPATIENT_CLINIC_OR_DEPARTMENT_OTHER): Payer: Medicare HMO | Admitting: Pulmonary Disease

## 2022-12-18 VITALS — BP 114/64 | HR 82 | Ht 63.0 in | Wt 173.0 lb

## 2022-12-18 DIAGNOSIS — R911 Solitary pulmonary nodule: Secondary | ICD-10-CM

## 2022-12-18 DIAGNOSIS — J9801 Acute bronchospasm: Secondary | ICD-10-CM

## 2022-12-18 DIAGNOSIS — J4489 Other specified chronic obstructive pulmonary disease: Secondary | ICD-10-CM

## 2022-12-18 NOTE — Progress Notes (Signed)
[facility]  Chief Complaint  Patient presents with   Follow-up    Pt was recently in the hospital after having pneumonia. Since the hospital stay, pt is recovering.    Constitutional:  BP 114/64 (BP Location: Left Arm, Patient Position: Sitting, Cuff Size: Normal)   Pulse 82   Ht '5\' 3"'$  (1.6 m)   Wt 173 lb (78.5 kg)   SpO2 94% Comment: RA  BMI 30.65 kg/m   Past Medical History:  HLD, Neuropathy, Diverticulosis, Tinnitus, Diverticulosis, Colon polyps, Vascular dementia  Past Surgical History:  Her  has a past surgical history that includes Abdominal hysterectomy; Lumbar laminectomy; Nasal sinus surgery; and Shoulder surgery.  Brief Summary:  Susan Collins is a 77 y.o. female former smoker with COPD/emphysema.      Subjective:   Since her last visit she has been diagnosed with dementia and resides at [facility] memory unit.  She is here with her daughter and aide from facility.  Most of information obtained from her daughter.  She had CT chest in July 2023.  This showed nodules in Lt lower lobe.  She was in hospital in February 2024 with pneumonia.  Breathing has been better since she got out of hospital.  She didn't need supplemental oxygen after discharge.  She has occasional cough and clear sputum.  No fever, wheeze, hemoptysis, or chest pain.  Sleeping okay.  Needs a new nebulizer machine.  Physical Exam:   Appearance - well kempt, sitting in wheelchair   ENMT - no sinus tenderness, no oral exudate, no LAN, Mallampati 3 airway, no stridor  Respiratory - decreased breath sounds bilaterally, no wheezing or rales  CV - s1s2 regular rate and rhythm, no murmurs  Ext - 1+ edema  Skin - no rashes  Psych - normal mood and affect       Pulmonary testing:  PFT 03/21/15 >> FEV1 1.43 (66%), FEV1% 66, TLC 5.56 (113%), DLCO 52%, + BD  Chest imaging:  CT chest 02/11/22 >> atherosclerosis, small  to moderate HH, apical scarring, diffuse central airway thickening with clusters of micronodules Rt > Lt, 2.1 x 1.3 cm infiltrate LLL, 1.2 cm nodule in LLL, scarring in RML/lingula/LLL  CT chest 04/12/22 >> stable tree in bud RUL, new micronodule cluster LUL, stable LLL 1.2 cm nodule, decreased size of 1.6 x 1 cm LLL nodule (was 2.1 x 1.3 cm), bronchial wall thickening  Cardiac testing:  Echo 09/26/22 >> EF 66%  Social History:  She  reports that she quit smoking about 26 years ago. Her smoking use included cigarettes. She started smoking about 63 years ago. She has a 53.00 pack-year smoking history. She has never used smokeless tobacco. She reports that she does not drink alcohol and does not use drugs.  Family History:  Her family history includes Bladder Cancer in her brother; COPD in her brother and father; Heart attack in her father; Heart disease in her father; Lung cancer in her mother.     Assessment/Plan:   COPD with emphysema and asthma. - continue trelegy 100 one puff daily and singulair 10 mg nightly - prn albuterol - will arrange for new nebulizer machine  Lung nodules. - discussed CT chest findings from July 2023 - tentative plan is to repeat CT chest without contrast in July 2024 - her children (son and daughter) will discuss plan of care and decide how aggressive we should be if her follow up CT  chest shows progression of nodules  Vascular dementia. - she resides in a memory care unit and needs help with most of her ADLs  Goals of care. - DNR/DNI  Time Spent Involved in Patient Care on Day of Examination:  38 minutes  Follow up:   Patient Instructions  You were seen by Dr. Chesley Mires today for COPD from asthma and emphysema, and lung nodules.   Continue trelegy one puff daily.  Continue montelukast 10 mg nightly.  Will arrange for new nebulizer machine.  You can use albuterol in nebulizer every 6 hours as needed for cough, wheeze, chest congestion, or  shortness of breath.  Will schedule CT chest without contrast for July 2024 and arrange for a follow up appointment after this is done.  Medication List:   Allergies as of 12/18/2022       Reactions   Sulfonamide Derivatives    REACTION: headache        Medication List        Accurate as of December 18, 2022  2:09 PM. If you have any questions, ask your nurse or doctor.          acetaminophen 650 MG CR tablet Commonly known as: TYLENOL Take 1,300 mg by mouth every 8 (eight) hours as needed for pain.   albuterol (2.5 MG/3ML) 0.083% nebulizer solution Commonly known as: PROVENTIL TAKE 3 MLS BY NEBULIZATION EVERY 6 (SIX) HOURS AS NEEDED FOR WHEEZING OR SHORTNESS OF BREATH. What changed:  how much to take how to take this when to take this reasons to take this   albuterol 108 (90 Base) MCG/ACT inhaler Commonly known as: Ventolin HFA INHALE TWO PUFFS BY MOUTH EVERY 6 HOURS AS NEEDED What changed:  how much to take how to take this when to take this reasons to take this   ammonium lactate 12 % lotion Commonly known as: AmLactin Apply 1 application. topically as needed for dry skin.   calcium carbonate 1250 (500 Ca) MG tablet Commonly known as: OS-CAL - dosed in mg of elemental calcium Take 1 tablet by mouth daily.   furosemide 20 MG tablet Commonly known as: LASIX Take 40 mg by mouth daily.   ibandronate 150 MG tablet Commonly known as: BONIVA Take 150 mg by mouth every 30 (thirty) days.   montelukast 10 MG tablet Commonly known as: SINGULAIR Take 1 tablet (10 mg total) by mouth at bedtime.   polyethylene glycol 17 g packet Commonly known as: MIRALAX / GLYCOLAX Take 17 g by mouth daily.   simvastatin 40 MG tablet Commonly known as: ZOCOR Take 1 tablet (40 mg total) by mouth daily.   Trelegy Ellipta 100-62.5-25 MCG/ACT Aepb Generic drug: Fluticasone-Umeclidin-Vilant Inhale 1 puff into the lungs daily.   vitamin C 1000 MG tablet Take 1,000 mg by mouth  daily.   Vitamin D3 25 MCG (1000 UT) capsule Generic drug: Cholecalciferol Take 1,000 Units by mouth daily.        Signature:  Chesley Mires, MD [facility] Pager - [phone] 12/18/2022, 2:09 PM

## 2022-12-18 NOTE — Patient Instructions (Signed)
You were seen by Dr. Chesley Mires today for COPD from asthma and emphysema, and lung nodules.   Continue trelegy one puff daily.  Continue montelukast 10 mg nightly.  Will arrange for new nebulizer machine.  You can use albuterol in nebulizer every 6 hours as needed for cough, wheeze, chest congestion, or shortness of breath.  Will schedule CT chest without contrast for July 2024 and arrange for a follow up appointment after this is done.

## 2022-12-22 DIAGNOSIS — D72829 Elevated white blood cell count, unspecified: Secondary | ICD-10-CM | POA: Diagnosis not present

## 2022-12-22 DIAGNOSIS — F0390 Unspecified dementia without behavioral disturbance: Secondary | ICD-10-CM | POA: Diagnosis not present

## 2022-12-22 DIAGNOSIS — A419 Sepsis, unspecified organism: Secondary | ICD-10-CM | POA: Diagnosis not present

## 2022-12-22 DIAGNOSIS — J181 Lobar pneumonia, unspecified organism: Secondary | ICD-10-CM | POA: Diagnosis not present

## 2022-12-22 DIAGNOSIS — D649 Anemia, unspecified: Secondary | ICD-10-CM | POA: Diagnosis not present

## 2022-12-22 DIAGNOSIS — J441 Chronic obstructive pulmonary disease with (acute) exacerbation: Secondary | ICD-10-CM | POA: Diagnosis not present

## 2022-12-22 DIAGNOSIS — J9601 Acute respiratory failure with hypoxia: Secondary | ICD-10-CM | POA: Diagnosis not present

## 2022-12-22 DIAGNOSIS — M810 Age-related osteoporosis without current pathological fracture: Secondary | ICD-10-CM | POA: Diagnosis not present

## 2022-12-22 DIAGNOSIS — I872 Venous insufficiency (chronic) (peripheral): Secondary | ICD-10-CM | POA: Diagnosis not present

## 2022-12-22 DIAGNOSIS — G609 Hereditary and idiopathic neuropathy, unspecified: Secondary | ICD-10-CM | POA: Diagnosis not present

## 2022-12-22 DIAGNOSIS — Z0000 Encounter for general adult medical examination without abnormal findings: Secondary | ICD-10-CM | POA: Diagnosis not present

## 2022-12-25 DIAGNOSIS — J441 Chronic obstructive pulmonary disease with (acute) exacerbation: Secondary | ICD-10-CM | POA: Diagnosis not present

## 2022-12-25 DIAGNOSIS — A419 Sepsis, unspecified organism: Secondary | ICD-10-CM | POA: Diagnosis not present

## 2022-12-25 DIAGNOSIS — F0390 Unspecified dementia without behavioral disturbance: Secondary | ICD-10-CM | POA: Diagnosis not present

## 2022-12-25 DIAGNOSIS — D72829 Elevated white blood cell count, unspecified: Secondary | ICD-10-CM | POA: Diagnosis not present

## 2022-12-25 DIAGNOSIS — G609 Hereditary and idiopathic neuropathy, unspecified: Secondary | ICD-10-CM | POA: Diagnosis not present

## 2022-12-25 DIAGNOSIS — J9601 Acute respiratory failure with hypoxia: Secondary | ICD-10-CM | POA: Diagnosis not present

## 2022-12-25 DIAGNOSIS — J181 Lobar pneumonia, unspecified organism: Secondary | ICD-10-CM | POA: Diagnosis not present

## 2022-12-25 DIAGNOSIS — D649 Anemia, unspecified: Secondary | ICD-10-CM | POA: Diagnosis not present

## 2022-12-25 DIAGNOSIS — M810 Age-related osteoporosis without current pathological fracture: Secondary | ICD-10-CM | POA: Diagnosis not present

## 2022-12-29 DIAGNOSIS — J441 Chronic obstructive pulmonary disease with (acute) exacerbation: Secondary | ICD-10-CM | POA: Diagnosis not present

## 2022-12-29 DIAGNOSIS — D649 Anemia, unspecified: Secondary | ICD-10-CM | POA: Diagnosis not present

## 2022-12-29 DIAGNOSIS — J181 Lobar pneumonia, unspecified organism: Secondary | ICD-10-CM | POA: Diagnosis not present

## 2022-12-29 DIAGNOSIS — A419 Sepsis, unspecified organism: Secondary | ICD-10-CM | POA: Diagnosis not present

## 2022-12-29 DIAGNOSIS — J9601 Acute respiratory failure with hypoxia: Secondary | ICD-10-CM | POA: Diagnosis not present

## 2022-12-29 DIAGNOSIS — M810 Age-related osteoporosis without current pathological fracture: Secondary | ICD-10-CM | POA: Diagnosis not present

## 2022-12-29 DIAGNOSIS — D72829 Elevated white blood cell count, unspecified: Secondary | ICD-10-CM | POA: Diagnosis not present

## 2022-12-29 DIAGNOSIS — F0390 Unspecified dementia without behavioral disturbance: Secondary | ICD-10-CM | POA: Diagnosis not present

## 2022-12-29 DIAGNOSIS — G609 Hereditary and idiopathic neuropathy, unspecified: Secondary | ICD-10-CM | POA: Diagnosis not present

## 2023-01-02 DIAGNOSIS — F0390 Unspecified dementia without behavioral disturbance: Secondary | ICD-10-CM | POA: Diagnosis not present

## 2023-01-02 DIAGNOSIS — M810 Age-related osteoporosis without current pathological fracture: Secondary | ICD-10-CM | POA: Diagnosis not present

## 2023-01-02 DIAGNOSIS — G609 Hereditary and idiopathic neuropathy, unspecified: Secondary | ICD-10-CM | POA: Diagnosis not present

## 2023-01-02 DIAGNOSIS — J9601 Acute respiratory failure with hypoxia: Secondary | ICD-10-CM | POA: Diagnosis not present

## 2023-01-02 DIAGNOSIS — J181 Lobar pneumonia, unspecified organism: Secondary | ICD-10-CM | POA: Diagnosis not present

## 2023-01-02 DIAGNOSIS — D649 Anemia, unspecified: Secondary | ICD-10-CM | POA: Diagnosis not present

## 2023-01-02 DIAGNOSIS — J441 Chronic obstructive pulmonary disease with (acute) exacerbation: Secondary | ICD-10-CM | POA: Diagnosis not present

## 2023-01-02 DIAGNOSIS — D72829 Elevated white blood cell count, unspecified: Secondary | ICD-10-CM | POA: Diagnosis not present

## 2023-01-02 DIAGNOSIS — A419 Sepsis, unspecified organism: Secondary | ICD-10-CM | POA: Diagnosis not present

## 2023-01-07 DIAGNOSIS — A419 Sepsis, unspecified organism: Secondary | ICD-10-CM | POA: Diagnosis not present

## 2023-01-07 DIAGNOSIS — F0390 Unspecified dementia without behavioral disturbance: Secondary | ICD-10-CM | POA: Diagnosis not present

## 2023-01-07 DIAGNOSIS — D649 Anemia, unspecified: Secondary | ICD-10-CM | POA: Diagnosis not present

## 2023-01-07 DIAGNOSIS — D72829 Elevated white blood cell count, unspecified: Secondary | ICD-10-CM | POA: Diagnosis not present

## 2023-01-07 DIAGNOSIS — J9601 Acute respiratory failure with hypoxia: Secondary | ICD-10-CM | POA: Diagnosis not present

## 2023-01-07 DIAGNOSIS — G609 Hereditary and idiopathic neuropathy, unspecified: Secondary | ICD-10-CM | POA: Diagnosis not present

## 2023-01-07 DIAGNOSIS — J441 Chronic obstructive pulmonary disease with (acute) exacerbation: Secondary | ICD-10-CM | POA: Diagnosis not present

## 2023-01-07 DIAGNOSIS — M810 Age-related osteoporosis without current pathological fracture: Secondary | ICD-10-CM | POA: Diagnosis not present

## 2023-01-07 DIAGNOSIS — J181 Lobar pneumonia, unspecified organism: Secondary | ICD-10-CM | POA: Diagnosis not present

## 2023-01-08 DIAGNOSIS — A419 Sepsis, unspecified organism: Secondary | ICD-10-CM | POA: Diagnosis not present

## 2023-01-08 DIAGNOSIS — J441 Chronic obstructive pulmonary disease with (acute) exacerbation: Secondary | ICD-10-CM | POA: Diagnosis not present

## 2023-01-08 DIAGNOSIS — D72829 Elevated white blood cell count, unspecified: Secondary | ICD-10-CM | POA: Diagnosis not present

## 2023-01-08 DIAGNOSIS — D649 Anemia, unspecified: Secondary | ICD-10-CM | POA: Diagnosis not present

## 2023-01-08 DIAGNOSIS — G609 Hereditary and idiopathic neuropathy, unspecified: Secondary | ICD-10-CM | POA: Diagnosis not present

## 2023-01-08 DIAGNOSIS — J9601 Acute respiratory failure with hypoxia: Secondary | ICD-10-CM | POA: Diagnosis not present

## 2023-01-08 DIAGNOSIS — J181 Lobar pneumonia, unspecified organism: Secondary | ICD-10-CM | POA: Diagnosis not present

## 2023-01-08 DIAGNOSIS — F0390 Unspecified dementia without behavioral disturbance: Secondary | ICD-10-CM | POA: Diagnosis not present

## 2023-01-08 DIAGNOSIS — M810 Age-related osteoporosis without current pathological fracture: Secondary | ICD-10-CM | POA: Diagnosis not present

## 2023-01-12 DIAGNOSIS — J181 Lobar pneumonia, unspecified organism: Secondary | ICD-10-CM | POA: Diagnosis not present

## 2023-01-12 DIAGNOSIS — G609 Hereditary and idiopathic neuropathy, unspecified: Secondary | ICD-10-CM | POA: Diagnosis not present

## 2023-01-12 DIAGNOSIS — F0390 Unspecified dementia without behavioral disturbance: Secondary | ICD-10-CM | POA: Diagnosis not present

## 2023-01-12 DIAGNOSIS — A419 Sepsis, unspecified organism: Secondary | ICD-10-CM | POA: Diagnosis not present

## 2023-01-12 DIAGNOSIS — M810 Age-related osteoporosis without current pathological fracture: Secondary | ICD-10-CM | POA: Diagnosis not present

## 2023-01-12 DIAGNOSIS — D72829 Elevated white blood cell count, unspecified: Secondary | ICD-10-CM | POA: Diagnosis not present

## 2023-01-12 DIAGNOSIS — J441 Chronic obstructive pulmonary disease with (acute) exacerbation: Secondary | ICD-10-CM | POA: Diagnosis not present

## 2023-01-12 DIAGNOSIS — D649 Anemia, unspecified: Secondary | ICD-10-CM | POA: Diagnosis not present

## 2023-01-12 DIAGNOSIS — J9601 Acute respiratory failure with hypoxia: Secondary | ICD-10-CM | POA: Diagnosis not present

## 2023-01-19 DIAGNOSIS — F01B4 Vascular dementia, moderate, with anxiety: Secondary | ICD-10-CM | POA: Diagnosis not present

## 2023-01-19 DIAGNOSIS — I69814 Frontal lobe and executive function deficit following other cerebrovascular disease: Secondary | ICD-10-CM | POA: Diagnosis not present

## 2023-01-19 DIAGNOSIS — J441 Chronic obstructive pulmonary disease with (acute) exacerbation: Secondary | ICD-10-CM | POA: Diagnosis not present

## 2023-01-19 DIAGNOSIS — R627 Adult failure to thrive: Secondary | ICD-10-CM | POA: Diagnosis not present

## 2023-01-19 DIAGNOSIS — F01B0 Vascular dementia, moderate, without behavioral disturbance, psychotic disturbance, mood disturbance, and anxiety: Secondary | ICD-10-CM | POA: Diagnosis not present

## 2023-01-19 DIAGNOSIS — R296 Repeated falls: Secondary | ICD-10-CM | POA: Diagnosis not present

## 2023-01-19 DIAGNOSIS — F411 Generalized anxiety disorder: Secondary | ICD-10-CM | POA: Diagnosis not present

## 2023-01-19 DIAGNOSIS — R4182 Altered mental status, unspecified: Secondary | ICD-10-CM | POA: Diagnosis not present

## 2023-01-21 DIAGNOSIS — I10 Essential (primary) hypertension: Secondary | ICD-10-CM | POA: Diagnosis not present

## 2023-01-22 DIAGNOSIS — F0390 Unspecified dementia without behavioral disturbance: Secondary | ICD-10-CM | POA: Diagnosis not present

## 2023-01-22 DIAGNOSIS — D649 Anemia, unspecified: Secondary | ICD-10-CM | POA: Diagnosis not present

## 2023-01-22 DIAGNOSIS — G609 Hereditary and idiopathic neuropathy, unspecified: Secondary | ICD-10-CM | POA: Diagnosis not present

## 2023-01-22 DIAGNOSIS — J441 Chronic obstructive pulmonary disease with (acute) exacerbation: Secondary | ICD-10-CM | POA: Diagnosis not present

## 2023-01-22 DIAGNOSIS — J9601 Acute respiratory failure with hypoxia: Secondary | ICD-10-CM | POA: Diagnosis not present

## 2023-01-22 DIAGNOSIS — J181 Lobar pneumonia, unspecified organism: Secondary | ICD-10-CM | POA: Diagnosis not present

## 2023-01-22 DIAGNOSIS — D72829 Elevated white blood cell count, unspecified: Secondary | ICD-10-CM | POA: Diagnosis not present

## 2023-01-22 DIAGNOSIS — M810 Age-related osteoporosis without current pathological fracture: Secondary | ICD-10-CM | POA: Diagnosis not present

## 2023-01-22 DIAGNOSIS — A419 Sepsis, unspecified organism: Secondary | ICD-10-CM | POA: Diagnosis not present

## 2023-01-26 DIAGNOSIS — D72829 Elevated white blood cell count, unspecified: Secondary | ICD-10-CM | POA: Diagnosis not present

## 2023-01-26 DIAGNOSIS — J441 Chronic obstructive pulmonary disease with (acute) exacerbation: Secondary | ICD-10-CM | POA: Diagnosis not present

## 2023-01-26 DIAGNOSIS — F0390 Unspecified dementia without behavioral disturbance: Secondary | ICD-10-CM | POA: Diagnosis not present

## 2023-01-26 DIAGNOSIS — D631 Anemia in chronic kidney disease: Secondary | ICD-10-CM | POA: Diagnosis not present

## 2023-01-26 DIAGNOSIS — J181 Lobar pneumonia, unspecified organism: Secondary | ICD-10-CM | POA: Diagnosis not present

## 2023-01-26 DIAGNOSIS — D649 Anemia, unspecified: Secondary | ICD-10-CM | POA: Diagnosis not present

## 2023-01-26 DIAGNOSIS — N1832 Chronic kidney disease, stage 3b: Secondary | ICD-10-CM | POA: Diagnosis not present

## 2023-01-26 DIAGNOSIS — A419 Sepsis, unspecified organism: Secondary | ICD-10-CM | POA: Diagnosis not present

## 2023-01-26 DIAGNOSIS — J9601 Acute respiratory failure with hypoxia: Secondary | ICD-10-CM | POA: Diagnosis not present

## 2023-01-26 DIAGNOSIS — G609 Hereditary and idiopathic neuropathy, unspecified: Secondary | ICD-10-CM | POA: Diagnosis not present

## 2023-01-26 DIAGNOSIS — M810 Age-related osteoporosis without current pathological fracture: Secondary | ICD-10-CM | POA: Diagnosis not present

## 2023-01-26 DIAGNOSIS — J449 Chronic obstructive pulmonary disease, unspecified: Secondary | ICD-10-CM | POA: Diagnosis not present

## 2023-01-27 DIAGNOSIS — F331 Major depressive disorder, recurrent, moderate: Secondary | ICD-10-CM | POA: Diagnosis not present

## 2023-01-27 DIAGNOSIS — F411 Generalized anxiety disorder: Secondary | ICD-10-CM | POA: Diagnosis not present

## 2023-01-27 DIAGNOSIS — I69814 Frontal lobe and executive function deficit following other cerebrovascular disease: Secondary | ICD-10-CM | POA: Diagnosis not present

## 2023-01-27 DIAGNOSIS — F01B4 Vascular dementia, moderate, with anxiety: Secondary | ICD-10-CM | POA: Diagnosis not present

## 2023-01-28 DIAGNOSIS — D649 Anemia, unspecified: Secondary | ICD-10-CM | POA: Diagnosis not present

## 2023-01-28 DIAGNOSIS — A419 Sepsis, unspecified organism: Secondary | ICD-10-CM | POA: Diagnosis not present

## 2023-01-28 DIAGNOSIS — J441 Chronic obstructive pulmonary disease with (acute) exacerbation: Secondary | ICD-10-CM | POA: Diagnosis not present

## 2023-01-28 DIAGNOSIS — J181 Lobar pneumonia, unspecified organism: Secondary | ICD-10-CM | POA: Diagnosis not present

## 2023-01-28 DIAGNOSIS — F0390 Unspecified dementia without behavioral disturbance: Secondary | ICD-10-CM | POA: Diagnosis not present

## 2023-01-28 DIAGNOSIS — M810 Age-related osteoporosis without current pathological fracture: Secondary | ICD-10-CM | POA: Diagnosis not present

## 2023-01-28 DIAGNOSIS — G609 Hereditary and idiopathic neuropathy, unspecified: Secondary | ICD-10-CM | POA: Diagnosis not present

## 2023-01-28 DIAGNOSIS — D72829 Elevated white blood cell count, unspecified: Secondary | ICD-10-CM | POA: Diagnosis not present

## 2023-01-28 DIAGNOSIS — J9601 Acute respiratory failure with hypoxia: Secondary | ICD-10-CM | POA: Diagnosis not present

## 2023-01-30 ENCOUNTER — Inpatient Hospital Stay (HOSPITAL_COMMUNITY): Payer: Medicare HMO

## 2023-01-30 ENCOUNTER — Emergency Department (HOSPITAL_COMMUNITY): Payer: Medicare HMO

## 2023-01-30 ENCOUNTER — Other Ambulatory Visit: Payer: Self-pay

## 2023-01-30 ENCOUNTER — Encounter (HOSPITAL_COMMUNITY): Payer: Self-pay | Admitting: Internal Medicine

## 2023-01-30 ENCOUNTER — Inpatient Hospital Stay (HOSPITAL_COMMUNITY)
Admission: EM | Admit: 2023-01-30 | Discharge: 2023-02-02 | DRG: 682 | Disposition: A | Discharge status: Hospice | Payer: Medicare HMO | Source: Other Acute Inpatient Hospital | Attending: Family Medicine | Admitting: Family Medicine

## 2023-01-30 DIAGNOSIS — Z66 Do not resuscitate: Secondary | ICD-10-CM | POA: Diagnosis not present

## 2023-01-30 DIAGNOSIS — E870 Hyperosmolality and hypernatremia: Secondary | ICD-10-CM | POA: Diagnosis not present

## 2023-01-30 DIAGNOSIS — Z8719 Personal history of other diseases of the digestive system: Secondary | ICD-10-CM | POA: Diagnosis not present

## 2023-01-30 DIAGNOSIS — N179 Acute kidney failure, unspecified: Principal | ICD-10-CM | POA: Diagnosis present

## 2023-01-30 DIAGNOSIS — R569 Unspecified convulsions: Secondary | ICD-10-CM | POA: Diagnosis not present

## 2023-01-30 DIAGNOSIS — M7989 Other specified soft tissue disorders: Secondary | ICD-10-CM | POA: Diagnosis not present

## 2023-01-30 DIAGNOSIS — E876 Hypokalemia: Secondary | ICD-10-CM | POA: Diagnosis not present

## 2023-01-30 DIAGNOSIS — Z8701 Personal history of pneumonia (recurrent): Secondary | ICD-10-CM

## 2023-01-30 DIAGNOSIS — L89151 Pressure ulcer of sacral region, stage 1: Secondary | ICD-10-CM | POA: Diagnosis present

## 2023-01-30 DIAGNOSIS — E860 Dehydration: Secondary | ICD-10-CM | POA: Diagnosis present

## 2023-01-30 DIAGNOSIS — Z993 Dependence on wheelchair: Secondary | ICD-10-CM | POA: Diagnosis not present

## 2023-01-30 DIAGNOSIS — Z79899 Other long term (current) drug therapy: Secondary | ICD-10-CM | POA: Diagnosis not present

## 2023-01-30 DIAGNOSIS — F0390 Unspecified dementia without behavioral disturbance: Secondary | ICD-10-CM | POA: Diagnosis present

## 2023-01-30 DIAGNOSIS — Z7401 Bed confinement status: Secondary | ICD-10-CM | POA: Diagnosis not present

## 2023-01-30 DIAGNOSIS — Z882 Allergy status to sulfonamides status: Secondary | ICD-10-CM

## 2023-01-30 DIAGNOSIS — G9009 Other idiopathic peripheral autonomic neuropathy: Secondary | ICD-10-CM | POA: Diagnosis present

## 2023-01-30 DIAGNOSIS — Z801 Family history of malignant neoplasm of trachea, bronchus and lung: Secondary | ICD-10-CM | POA: Diagnosis not present

## 2023-01-30 DIAGNOSIS — Z7983 Long term (current) use of bisphosphonates: Secondary | ICD-10-CM | POA: Diagnosis not present

## 2023-01-30 DIAGNOSIS — G9340 Encephalopathy, unspecified: Secondary | ICD-10-CM

## 2023-01-30 DIAGNOSIS — Z825 Family history of asthma and other chronic lower respiratory diseases: Secondary | ICD-10-CM | POA: Diagnosis not present

## 2023-01-30 DIAGNOSIS — G319 Degenerative disease of nervous system, unspecified: Secondary | ICD-10-CM | POA: Diagnosis not present

## 2023-01-30 DIAGNOSIS — I10 Essential (primary) hypertension: Secondary | ICD-10-CM | POA: Diagnosis not present

## 2023-01-30 DIAGNOSIS — Z7951 Long term (current) use of inhaled steroids: Secondary | ICD-10-CM

## 2023-01-30 DIAGNOSIS — L89159 Pressure ulcer of sacral region, unspecified stage: Secondary | ICD-10-CM

## 2023-01-30 DIAGNOSIS — Z8249 Family history of ischemic heart disease and other diseases of the circulatory system: Secondary | ICD-10-CM | POA: Diagnosis not present

## 2023-01-30 DIAGNOSIS — G9341 Metabolic encephalopathy: Secondary | ICD-10-CM | POA: Diagnosis present

## 2023-01-30 DIAGNOSIS — R410 Disorientation, unspecified: Secondary | ICD-10-CM | POA: Diagnosis not present

## 2023-01-30 DIAGNOSIS — Z87891 Personal history of nicotine dependence: Secondary | ICD-10-CM

## 2023-01-30 DIAGNOSIS — E7800 Pure hypercholesterolemia, unspecified: Secondary | ICD-10-CM | POA: Diagnosis present

## 2023-01-30 DIAGNOSIS — J189 Pneumonia, unspecified organism: Secondary | ICD-10-CM | POA: Diagnosis not present

## 2023-01-30 DIAGNOSIS — Z8052 Family history of malignant neoplasm of bladder: Secondary | ICD-10-CM | POA: Diagnosis not present

## 2023-01-30 DIAGNOSIS — Z90710 Acquired absence of both cervix and uterus: Secondary | ICD-10-CM

## 2023-01-30 DIAGNOSIS — R531 Weakness: Secondary | ICD-10-CM | POA: Diagnosis not present

## 2023-01-30 DIAGNOSIS — L8990 Pressure ulcer of unspecified site, unspecified stage: Secondary | ICD-10-CM | POA: Insufficient documentation

## 2023-01-30 DIAGNOSIS — R4182 Altered mental status, unspecified: Secondary | ICD-10-CM | POA: Diagnosis present

## 2023-01-30 DIAGNOSIS — Z1152 Encounter for screening for COVID-19: Secondary | ICD-10-CM

## 2023-01-30 DIAGNOSIS — I5031 Acute diastolic (congestive) heart failure: Secondary | ICD-10-CM | POA: Diagnosis not present

## 2023-01-30 DIAGNOSIS — R000 Tachycardia, unspecified: Secondary | ICD-10-CM | POA: Diagnosis not present

## 2023-01-30 DIAGNOSIS — R0902 Hypoxemia: Secondary | ICD-10-CM | POA: Diagnosis not present

## 2023-01-30 LAB — COMPREHENSIVE METABOLIC PANEL
ALT: 21 U/L (ref 0–44)
AST: 35 U/L (ref 15–41)
Albumin: 2.6 g/dL — ABNORMAL LOW (ref 3.5–5.0)
Alkaline Phosphatase: 68 U/L (ref 38–126)
Anion gap: 12 (ref 5–15)
BUN: 55 mg/dL — ABNORMAL HIGH (ref 8–23)
CO2: 24 mmol/L (ref 22–32)
Calcium: 10 mg/dL (ref 8.9–10.3)
Chloride: 105 mmol/L (ref 98–111)
Creatinine, Ser: 2.27 mg/dL — ABNORMAL HIGH (ref 0.44–1.00)
GFR, Estimated: 22 mL/min — ABNORMAL LOW (ref >60)
Glucose, Bld: 93 mg/dL (ref 70–99)
Potassium: 4.1 mmol/L (ref 3.5–5.1)
Sodium: 141 mmol/L (ref 135–145)
Total Bilirubin: 0.7 mg/dL (ref 0.3–1.2)
Total Protein: 8.2 g/dL — ABNORMAL HIGH (ref 6.5–8.1)

## 2023-01-30 LAB — DIFFERENTIAL
Abs Immature Granulocytes: 0.07 10*3/uL (ref 0.00–0.07)
Basophils Absolute: 0.1 10*3/uL (ref 0.0–0.1)
Basophils Relative: 1 %
Eosinophils Absolute: 0.4 10*3/uL (ref 0.0–0.5)
Eosinophils Relative: 3 %
Immature Granulocytes: 1 %
Lymphocytes Relative: 15 %
Lymphs Abs: 2.1 10*3/uL (ref 0.7–4.0)
Monocytes Absolute: 1.1 10*3/uL — ABNORMAL HIGH (ref 0.1–1.0)
Monocytes Relative: 8 %
Neutro Abs: 10 10*3/uL — ABNORMAL HIGH (ref 1.7–7.7)
Neutrophils Relative %: 72 %

## 2023-01-30 LAB — I-STAT VENOUS BLOOD GAS, ED
Acid-Base Excess: 0 mmol/L (ref 0.0–2.0)
Bicarbonate: 26.5 mmol/L (ref 20.0–28.0)
Calcium, Ion: 1.34 mmol/L (ref 1.15–1.40)
HCT: 33 % — ABNORMAL LOW (ref 36.0–46.0)
Hemoglobin: 11.2 g/dL — ABNORMAL LOW (ref 12.0–15.0)
O2 Saturation: 68 %
Potassium: 4 mmol/L (ref 3.5–5.1)
Sodium: 144 mmol/L (ref 135–145)
TCO2: 28 mmol/L (ref 22–32)
pCO2, Ven: 48.5 mmHg (ref 44–60)
pH, Ven: 7.346 (ref 7.25–7.43)
pO2, Ven: 38 mmHg (ref 32–45)

## 2023-01-30 LAB — RAPID URINE DRUG SCREEN, HOSP PERFORMED
Amphetamines: NOT DETECTED
Barbiturates: NOT DETECTED
Benzodiazepines: NOT DETECTED
Cocaine: NOT DETECTED
Opiates: NOT DETECTED
Tetrahydrocannabinol: NOT DETECTED

## 2023-01-30 LAB — MAGNESIUM: Magnesium: 2.4 mg/dL (ref 1.7–2.4)

## 2023-01-30 LAB — URINALYSIS, W/ REFLEX TO CULTURE (INFECTION SUSPECTED)
Bacteria, UA: NONE SEEN
Bilirubin Urine: NEGATIVE
Glucose, UA: NEGATIVE mg/dL
Hgb urine dipstick: NEGATIVE
Ketones, ur: NEGATIVE mg/dL
Leukocytes,Ua: NEGATIVE
Nitrite: NEGATIVE
Protein, ur: NEGATIVE mg/dL
Specific Gravity, Urine: 1.015 (ref 1.005–1.030)
pH: 5 (ref 5.0–8.0)

## 2023-01-30 LAB — RESP PANEL BY RT-PCR (RSV, FLU A&B, COVID)  RVPGX2
Influenza A by PCR: NEGATIVE
Influenza B by PCR: NEGATIVE
Resp Syncytial Virus by PCR: NEGATIVE
SARS Coronavirus 2 by RT PCR: NEGATIVE

## 2023-01-30 LAB — ETHANOL: Alcohol, Ethyl (B): <10 mg/dL (ref <10)

## 2023-01-30 LAB — CBC
HCT: 33.1 % — ABNORMAL LOW (ref 36.0–46.0)
Hemoglobin: 10.2 g/dL — ABNORMAL LOW (ref 12.0–15.0)
MCH: 27.1 pg (ref 26.0–34.0)
MCHC: 30.8 g/dL (ref 30.0–36.0)
MCV: 87.8 fL (ref 80.0–100.0)
Platelets: 377 10*3/uL (ref 150–400)
RBC: 3.77 MIL/uL — ABNORMAL LOW (ref 3.87–5.11)
RDW: 14.9 % (ref 11.5–15.5)
WBC: 13.7 10*3/uL — ABNORMAL HIGH (ref 4.0–10.5)
nRBC: 0 % (ref 0.0–0.2)

## 2023-01-30 LAB — TSH: TSH: 2.337 u[IU]/mL (ref 0.350–4.500)

## 2023-01-30 LAB — PROTIME-INR
INR: 1.2 (ref 0.8–1.2)
Prothrombin Time: 15.3 seconds — ABNORMAL HIGH (ref 11.4–15.2)

## 2023-01-30 LAB — AMMONIA: Ammonia: 17 umol/L (ref 9–35)

## 2023-01-30 LAB — VITAMIN B12: Vitamin B-12: 810 pg/mL (ref 180–914)

## 2023-01-30 LAB — MRSA NEXT GEN BY PCR, NASAL: MRSA by PCR Next Gen: DETECTED — AB

## 2023-01-30 LAB — APTT: aPTT: 30 seconds (ref 24–36)

## 2023-01-30 MED ORDER — ACETAMINOPHEN 325 MG PO TABS: 1000.0000 mg | ORAL_TABLET | Freq: Three times a day (TID) | ORAL | Status: DC | PRN

## 2023-01-30 MED ORDER — SODIUM CHLORIDE 0.9 % IV SOLN: INTRAVENOUS | Status: DC

## 2023-01-30 MED ORDER — LABETALOL HCL 5 MG/ML IV SOLN: 5.0000 mg | INTRAVENOUS | Status: DC | PRN

## 2023-01-30 MED ORDER — SODIUM CHLORIDE 0.9 % IV BOLUS
500.0000 mL | Freq: Once | INTRAVENOUS | Status: AC
  Administered 2023-01-30: 500 mL via INTRAVENOUS

## 2023-01-30 MED ORDER — CHLORHEXIDINE GLUCONATE CLOTH 2 % EX PADS: 6.0000 | MEDICATED_PAD | Freq: Every day | CUTANEOUS | Status: DC

## 2023-01-30 MED ORDER — MUPIROCIN 2 % EX OINT
1.0000 | TOPICAL_OINTMENT | Freq: Two times a day (BID) | CUTANEOUS | Status: DC
  Administered 2023-01-31 – 2023-02-01 (×3): 1 via NASAL
  Filled 2023-01-30 (×3): qty 22

## 2023-01-30 MED ORDER — POLYETHYLENE GLYCOL 3350 17 G PO PACK
17.0000 g | PACK | Freq: Every day | ORAL | Status: DC
  Filled 2023-01-30: qty 1

## 2023-01-30 MED ORDER — QUETIAPINE FUMARATE 25 MG PO TABS
12.5000 mg | ORAL_TABLET | Freq: Two times a day (BID) | ORAL | Status: DC
  Filled 2023-01-30 (×2): qty 1

## 2023-01-30 MED ORDER — DIAZEPAM 5 MG/ML IJ SOLN
5.0000 mg | Freq: Once | INTRAMUSCULAR | Status: AC | PRN
  Administered 2023-01-30: 5 mg via INTRAVENOUS
  Filled 2023-01-30: qty 2

## 2023-01-30 MED ORDER — HEPARIN SODIUM (PORCINE) 5000 UNIT/ML IJ SOLN
5000.0000 [IU] | Freq: Two times a day (BID) | INTRAMUSCULAR | Status: DC
  Administered 2023-01-30 – 2023-02-01 (×4): 5000 [IU] via SUBCUTANEOUS
  Filled 2023-01-30 (×4): qty 1

## 2023-01-30 MED ORDER — SIMVASTATIN 20 MG PO TABS
40.0000 mg | ORAL_TABLET | Freq: Every day | ORAL | Status: DC
  Filled 2023-01-30: qty 2

## 2023-01-30 MED ORDER — MIRTAZAPINE 15 MG PO TABS
7.5000 mg | ORAL_TABLET | Freq: Every day | ORAL | Status: DC
  Filled 2023-01-30: qty 1

## 2023-01-30 MED ORDER — SODIUM CHLORIDE 0.9 % IV BOLUS
250.0000 mL | Freq: Once | INTRAVENOUS | Status: AC
  Administered 2023-01-30: 250 mL via INTRAVENOUS

## 2023-01-30 MED ORDER — DIAZEPAM 5 MG/ML IJ SOLN: 5.0000 mg | Freq: Once | INTRAMUSCULAR | Status: DC

## 2023-01-30 MED ORDER — BUDESONIDE 0.5 MG/2ML IN SUSP
0.5000 mg | Freq: Every day | RESPIRATORY_TRACT | Status: DC
  Administered 2023-01-30 – 2023-02-01 (×3): 0.5 mg via RESPIRATORY_TRACT
  Filled 2023-01-30 (×3): qty 2

## 2023-01-30 MED ORDER — GUAIFENESIN ER 600 MG PO TB12
1200.0000 mg | ORAL_TABLET | Freq: Two times a day (BID) | ORAL | Status: DC
  Filled 2023-01-30 (×2): qty 2

## 2023-01-30 MED ORDER — SODIUM CHLORIDE 0.9 % IV SOLN
100.0000 mL/h | INTRAVENOUS | Status: DC
  Administered 2023-01-30 – 2023-02-01 (×3): 100 mL/h via INTRAVENOUS

## 2023-01-30 NOTE — ED Triage Notes (Signed)
Pt BIB [facility] from [facility] due to decrease in activity over the past week.  Pt had pneumonia 2 weeks ago and took all antibiotics and steroid treatment.  Hx of dementia & anxiety.

## 2023-01-30 NOTE — Progress Notes (Signed)
EEG complete - results pending 

## 2023-01-30 NOTE — Procedures (Signed)
Patient Name: Susan Collins  MRN: [id number]  Epilepsy Attending: Charlsie Quest  Referring Physician/Provider: Emeline General, MD  Date: 01/30/2023 Duration: 24.33 mins  Patient history: 77yo F with ams getting eeg to evaluate for seizure  Level of alertness: Awake, asleep  AEDs during EEG study: Valium  Technical aspects: This EEG study was done with scalp electrodes positioned according to the 10-20 International system of electrode placement. Electrical activity was reviewed with band pass filter of 1-70Hz , sensitivity of 7 uV/mm, display speed of 60mm/sec with a  notched filter applied as appropriate. EEG data were recorded continuously and digitally stored.  Video monitoring was available and reviewed as appropriate.  Description: The posterior dominant rhythm consists of 6-7 Hz activity of moderate voltage (25-35 uV) seen predominantly in posterior head regions, symmetric and reactive to eye opening and eye closing. Sleep was characterized by vertex waves, sleep spindles (12 to 14 Hz), maximal frontocentral region. EEG showed continuous generalized 5 to 7 Hz theta slowing admixed with intermittent generalized 2-3Hz  delta slowing.  Hyperventilation and photic stimulation were not performed.      ABNORMALITY - Continuous slow, generalized   IMPRESSION: This study is suggestive of moderate diffuse encephalopathy. No seizures or epileptiform discharges were seen throughout the recording.   Susan Collins

## 2023-01-30 NOTE — H&P (Signed)
History and Physical    Susan Collins WJX:[id number] DOB: 1946/07/15 DOA: 01/30/2023  PCP: Charlane Ferretti, DO (Confirm with patient/family/NH records and if not entered, this has to be entered at [facility] point of entry) Patient coming from: SNF  I have personally briefly reviewed patient's old medical records in [facility]  Chief Complaint: Patient can only mumbling  HPI: Susan Collins is a 77 y.o. female with medical history significant of COPD, advanced dementia, sent from nursing home for duration of worsening of mentations.  Patient could only mumbling, unable to answer any of my questions, or history provided by her 2 sons at bedside.  Family reports that the patient at baseline has advanced dementia, at baseline she is able to engage in conversation however very forgetful and slowly gradually related, family found the patient has had cognition declining over the last 1+ month.  Last 2 weeks, patient's mentation further declined, stopped feeding herself, has to be fed by either family member or nursing home staff.  Daytime, patient usually remains awake but eyes closed and continuous mumbling.  Family however is not sure whether patient has had sleep cycle disturbance.  Appears to have no breathing problems no pains.  But overall since ambulation has severely decreased and remains bedbound most of the days for the last 2 weeks.  ED Course: Mild tachycardia blood pressure mildly elevated authorization 95% on room air. CT head showed suspicious right occipital hypodensity involving some 4 subacute stroke.  Blood work showed WBC 13.7, hemoglobin 10.2, creatinine 2.2 compared to baseline 0.97 of last month.  X-ray negative for acute infiltrates, UA showed no signs of UTI.  Ammonium level within normal limits.  Review of Systems: Unable to perform, patient is confused.  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 Her smoking use included cigarettes. She started smoking about 63 years ago. She has a 53.00 pack-year smoking history. She has never used smokeless tobacco. She reports that she does not drink alcohol and does not use drugs.  Allergies  Allergen Reactions   Sulfonamide Derivatives     REACTION: headache    Family History  Problem Relation Age of Onset   Lung cancer Mother        died of lung Ca   COPD Father    Heart disease Father        died of MI   Heart attack Father    COPD Brother    Bladder Cancer Brother      Prior to Admission medications   Medication Sig Start Date End Date Taking? Authorizing Provider  acetaminophen (TYLENOL) 650 MG CR tablet Take 1,300 mg by mouth every 8 (eight) hours as needed for pain. 11/16/22  Yes [provider]  albuterol (PROVENTIL) (2.5 MG/3ML) 0.083% nebulizer solution TAKE 3 MLS BY NEBULIZATION EVERY 6 (SIX) HOURS AS NEEDED FOR WHEEZING OR SHORTNESS OF BREATH.  Patient taking differently: Take 2.5 mg by nebulization daily. Inhale 3mL into the lungs once daily at 1600 and every 6 hours as needed for SOB or wheezing 12/09/21  Yes Coralyn Helling, MD  albuterol (VENTOLIN HFA) 108 (90 Base) MCG/ACT inhaler INHALE TWO PUFFS BY MOUTH EVERY 6 HOURS AS NEEDED Patient taking differently: Inhale 2 puffs into the lungs every 4 (four) hours as needed for wheezing or shortness of breath. INHALE TWO PUFFS BY MOUTH EVERY 6 HOURS AS NEEDED 03/07/22  Yes Cobb, Ruby Cola, NP   budesonide (PULMICORT) 0.5 MG/2ML nebulizer solution Take 0.5 mg by nebulization daily.   Yes [provider]  calcium carbonate (OS-CAL - DOSED IN MG OF ELEMENTAL CALCIUM) 1250 (500 Ca) MG tablet Take 1 tablet by mouth daily.   Yes [provider]  Cholecalciferol (VITAMIN D3) 1000 units CAPS Take 1,000 Units by mouth daily.   Yes [provider]  ibandronate (BONIVA) 150 MG tablet Take 150 mg by mouth every 30 (thirty) days. 11/17/22  Yes [provider]  LORazepam (ATIVAN) 0.5 MG tablet Take 0.25 mg by mouth every 8 (eight) hours.   Yes [provider]  mirtazapine (REMERON) 7.5 MG tablet Take 7.5 mg by mouth at bedtime.   Yes [provider]  montelukast (SINGULAIR) 10 MG tablet Take 1 tablet (10 mg total) by mouth at bedtime. 07/25/22  Yes Cobb, Ruby Cola, NP  polyethylene glycol (MIRALAX / GLYCOLAX) 17 g packet Take 17 g by mouth daily.   Yes [provider]  QUEtiapine (SEROQUEL) 25 MG tablet Take 12.5 mg by mouth 2 (two) times daily.   Yes [provider]  simvastatin (ZOCOR) 40 MG tablet Take 1 tablet (40 mg total) by mouth daily. 05/20/18  Yes Gordy Savers, MD  Vitamins A & D (VITAMIN A & D) ointment Apply 1 Application topically See admin instructions. Apply to buttocks every shift and as needed after each incontinent episode.   Yes [provider]  Fluticasone-Umeclidin-Vilant (TRELEGY ELLIPTA) 100-62.5-25 MCG/ACT AEPB Inhale 1 puff into the lungs daily. Patient not taking: Reported on 01/30/2023 08/02/21   Coralyn Helling, MD  furosemide (LASIX) 20 MG tablet Take 40 mg by mouth daily. Patient not taking: Reported on 01/30/2023 11/14/22   [provider]    Physical Exam: Vitals:   01/30/23 1139 01/30/23 1145 01/30/23 1543 01/30/23 1600  BP: (!) 122/105  (!) 142/85 (!) 131/112  Pulse: (!) 110  (!) 102 (!) 103  Resp: 17  19 (!) 24  Temp: 97.6 F (36.4 C)     TempSrc: Oral     SpO2: 95%  94%  93%  Weight:  79 kg    Height:  5\' 3"  (1.6 m)      Constitutional: NAD, calm, comfortable Vitals:   01/30/23 1139 01/30/23 1145 01/30/23 1543 01/30/23 1600  BP: (!) 122/105  (!) 142/85 (!) 131/112  Pulse: (!) 110  (!) 102 (!) 103  Resp: 17  19 (!) 24  Temp: 97.6 F (36.4 C)     TempSrc: Oral     SpO2: 95%  94% 93%  Weight:  79 kg    Height:  5\' 3"  (1.6 m)     Eyes: PERRL, lids and conjunctivae normal ENMT: Mucous membranes are dry. Posterior pharynx clear of any exudate or lesions.Normal dentition.  Neck: normal, supple, no masses, no thyromegaly Respiratory: clear to auscultation bilaterally, no wheezing, no crackles. Normal respiratory effort. No accessory muscle use.  Cardiovascular: Regular rate  and rhythm, no murmurs / rubs / gallops. 2+ extremity edema. 2+ pedal pulses. No carotid bruits.  Abdomen: no tenderness, no masses palpated. No hepatosplenomegaly. Bowel sounds positive.  Musculoskeletal: no clubbing / cyanosis. No joint deformity upper and lower extremities. Good ROM, no contractures. Normal muscle tone.  Skin: Rash on the sacral area with small skin abrasions. Neurologic: No facial droops, moving all limbs, not following commands Psychiatric: Awake, mumbling as a closed.  Confused    Labs on Admission: I have personally reviewed following labs and imaging studies  CBC: Recent Labs  Lab 01/30/23 1154 01/30/23 1237  WBC 13.7*  --   NEUTROABS 10.0*  --   HGB 10.2* 11.2*  HCT 33.1* 33.0*  MCV 87.8  --   PLT 377  --    Basic Metabolic Panel: Recent Labs  Lab 01/30/23 1154 01/30/23 1237  NA 141 144  K 4.1 4.0  CL 105  --   CO2 24  --   GLUCOSE 93  --   BUN 55*  --   CREATININE 2.27*  --   CALCIUM 10.0  --    GFR: Estimated Creatinine Clearance: 20.3 mL/min (A) (by C-G formula based on SCr of 2.27 mg/dL (H)). Liver Function Tests: Recent Labs  Lab 01/30/23 1154  AST 35  ALT 21  ALKPHOS 68  BILITOT 0.7  PROT 8.2*  ALBUMIN 2.6*   No  results for input(s): "LIPASE", "AMYLASE" in the last 168 hours. Recent Labs  Lab 01/30/23 1154  AMMONIA 17   Coagulation Profile: Recent Labs  Lab 01/30/23 1154  INR 1.2   Cardiac Enzymes: No results for input(s): "CKTOTAL", "CKMB", "CKMBINDEX", "TROPONINI" in the last 168 hours. BNP (last 3 results) Recent Labs    01/31/22 0900  PROBNP 44.0   HbA1C: No results for input(s): "HGBA1C" in the last 72 hours. CBG: No results for input(s): "GLUCAP" in the last 168 hours. Lipid Profile: No results for input(s): "CHOL", "HDL", "LDLCALC", "TRIG", "CHOLHDL", "LDLDIRECT" in the last 72 hours. Thyroid Function Tests: No results for input(s): "TSH", "T4TOTAL", "FREET4", "T3FREE", "THYROIDAB" in the last 72 hours. Anemia Panel: No results for input(s): "VITAMINB12", "FOLATE", "FERRITIN", "TIBC", "IRON", "RETICCTPCT" in the last 72 hours. Urine analysis:    Component Value Date/Time   COLORURINE AMBER (A) 01/30/2023 1157   APPEARANCEUR CLEAR 01/30/2023 1157   LABSPEC 1.015 01/30/2023 1157   PHURINE 5.0 01/30/2023 1157   GLUCOSEU NEGATIVE 01/30/2023 1157   HGBUR NEGATIVE 01/30/2023 1157   BILIRUBINUR NEGATIVE 01/30/2023 1157   KETONESUR NEGATIVE 01/30/2023 1157   PROTEINUR NEGATIVE 01/30/2023 1157   NITRITE NEGATIVE 01/30/2023 1157   LEUKOCYTESUR NEGATIVE 01/30/2023 1157    Radiological Exams on Admission: CT HEAD WO CONTRAST  Result Date: 01/30/2023 CLINICAL DATA:  Decreased activity, recent pneumonia, history of dementia EXAM: CT HEAD WITHOUT CONTRAST TECHNIQUE: Contiguous axial images were obtained from the base of the skull through the vertex without intravenous contrast. RADIATION DOSE REDUCTION: This exam was performed according to the departmental dose-optimization program which includes automated exposure control, adjustment of the mA and/or kV according to patient size and/or use of iterative reconstruction technique. COMPARISON:  None Available. FINDINGS: Brain: Focal  hypodensity in the right occipital lobe (series 3, image 16 and series 6, image 23), which may represent a subacute infarct. No evidence of acute hemorrhage, mass, mass effect, or midline shift. No hydrocephalus or extra-axial fluid collection. Ventriculomegaly, likely related to central atrophy, which is slightly more prominent left temporal lobe. Periventricular white matter  changes, likely the sequela of chronic small vessel ischemic disease. Vascular: No hyperdense vessel. Skull: Negative for fracture or focal lesion. Sinuses/Orbits: No acute finding. Other: The mastoid air cells are well aerated. IMPRESSION: Focal hypodensity in the right occipital lobe, which is technically age indeterminate but may represent a subacute infarct. An MRI is recommended for further evaluation. No acute intracranial hemorrhage. These results were called by telephone at the time of interpretation on 01/30/2023 at 1:53 pm to provider [facility] , who verbally acknowledged these results. Electronically Signed   By: Wiliam Ke M.D.   On: 01/30/2023 13:53   DG Chest Portable 1 View  Result Date: 01/30/2023 CLINICAL DATA:  Confusion EXAM: PORTABLE CHEST 1 VIEW COMPARISON:  Radiograph 11/29/2022 FINDINGS: Unchanged enlarged cardiac silhouette. There is no focal airspace consolidation. There is no pleural effusion or evidence of pneumothorax. Mild thoracic spondylosis. No acute osseous abnormality. IMPRESSION: No evidence of acute cardiopulmonary disease. Electronically Signed   By: Caprice Renshaw M.D.   On: 01/30/2023 12:57    EKG: Independently reviewed.  Sinus tachycardia, no acute ST changes.  Assessment/Plan Principal Problem:   AKI (acute kidney injury) Active Problems:   Dementia   DNR (do not resuscitate)   Acute encephalopathy  (please populate well all problems here in Problem List. (For example, if patient is on BP meds at home and you resume or decide to hold them, it is a problem that needs to be her. Same for  CAD, COPD, HLD and so on)  Acute metabolic encephalopathy -No clear etiology.  Most likely differential is acute on chronic worsening of baseline dementia with accelerated cognition declining as described by family member, including more frequent episode of confusion, cognition deterioration, unable to feed herself/daily function impairment in the last 2 weeks. -Organic etiology less convincing, AKI dehydration more likely related to the mentation changes, and no significant infection source identified, no acute infiltrates on x-ray, UA showed no UTI. -TSH, ammonia within normal limits. -Check brain MRI, check EEG -Long discussion with both patient's sons at bedside, confirmed that patient is DNR and does not wish to have a feeding tube.  I recommended that current strategy is to treat symptomatic issues such as AKI, hypokalemia, and if there is no significant improvement over the next 24-48 hours, probably should consider palliative care/hospice.  Both sons agreed. -NPO, speech evaluation tomorrow -IVF -Avoid sedation medications  Question of right occipital stroke -MRI -Continue statin and consider aspirin if MRI positive for stroke  Stage I sacral pressure ulcer -Unlikely causing infection and sepsis, hold off antibiotics, consult wound care  AKI -Prerenal secondary to volume contraction from poor oral intake -IVF then reevaluate  Hypokalemia -IV replacement, recheck level tomorrow -Magnesium level pending  Peripheral edema -No Hx of CHF, normal Echo in 2022 -DVT study -On IVF for AKI  COPD -No significant symptoms or signs of acute exacerbation, normal ABG, unlikely to contribute to her symptoms right now.  DVT prophylaxis: Heparin subcu Code Status: DNR Family Communication: Both sons at bedside Disposition Plan: Patient is sick with AKI and acute encephalopathy with unknown etiology, requiring inpatient IV fluid treatment and inpatient neurological workup, expect more than  2 midnight hospital stay Consults called: None Admission status: Tele admit   Emeline General MD [facility] Pager [phone]  01/30/2023, 4:06 PM

## 2023-01-30 NOTE — Progress Notes (Signed)
MB arrived to Patients room for EEG. Per Nurse Pt. Is in MRI and not available. Equipment was left in room for  when the Pt. arrives, Nurse will call to let us know once they are back and available for EEG.

## 2023-01-30 NOTE — ED Notes (Signed)
ED TO INPATIENT HANDOFF REPORT  ED Nurse Name and Phone #: Linford Quintela [phone]  S Name/Age/Gender Susan Collins 77 y.o. female Room/Bed: 027C/027C  Code Status   Code Status: DNR  Home/SNF/Other Skilled nursing facility Patient oriented to: self Is this baseline? No   Triage Complete: Triage complete  Chief Complaint AKI (acute kidney injury) [N17.9]  Triage Note Pt BIB [facility] from [facility] due to decrease in activity over the past week.  Pt had pneumonia 2 weeks ago and took all antibiotics and steroid treatment.  Hx of dementia & anxiety.   Allergies Allergies  Allergen Reactions   Sulfonamide Derivatives     REACTION: headache    Level of Care/Admitting Diagnosis ED Disposition     ED Disposition  Admit   Condition  --   Comment  Hospital Area: [facility] [[id number]]  Level of Care: Telemetry Medical [104]  May admit patient to [facility] or [facility] if equivalent level of care is available:: No  Covid Evaluation: Asymptomatic - no recent exposure (last 10 days) testing not required  Diagnosis: AKI (acute kidney injury) [454098]  Admitting Physician: Emeline General [[id number]]  Attending Physician: Emeline General [[id number]]  Certification:: I certify this patient will need inpatient services for at least 2 midnights  Estimated Length of Stay: 2          B Medical/Surgery History Past Medical History:  Diagnosis Date   CHEST PAIN 11/20/2008   COLONIC POLYPS, HX OF 05/24/2007   Diverticular disease    DYSPNEA ON EXERTION 12/19/2008   HYPERCHOLESTEROLEMIA 09/18/2008   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   Lobar pneumonia, unspecified organism (HCC) 06/24/2021   Peripheral neuropathy    POSTMENOPAUSAL SYNDROME 04/12/2008   TINNITUS, 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   Past Surgical History:  Procedure Laterality Date   ABDOMINAL HYSTERECTOMY     LUMBAR LAMINECTOMY     NASAL SINUS SURGERY     SHOULDER SURGERY        A IV Location/Drains/Wounds Patient Lines/Drains/Airways Status     Active Line/Drains/Airways     Name Placement date Placement time Site Days   Peripheral IV 01/30/23 20 G Left Antecubital 01/30/23  1156  Antecubital  less than 1            Intake/Output Last 24 hours No intake or output data in the 24 hours ending 01/30/23 1638  Labs/Imaging Results for orders placed or performed during the hospital encounter of 01/30/23 (from the past 48 hour(s))  Ethanol     Status: None   Collection Time: 01/30/23 11:54 AM  Result Value Ref Range   Alcohol, Ethyl (B) <10 <10 mg/dL    Comment: (NOTE) Lowest detectable limit for serum alcohol is 10 mg/dL.  For medical purposes only. Performed at [facility], [address], [place], [place] [address]   Protime-INR     Status: Abnormal   Collection Time: 01/30/23 11:54 AM  Result Value Ref Range   Prothrombin Time 15.3 (H) 11.4 - 15.2 seconds   INR 1.2 0.8 - 1.2    Comment: (NOTE) INR goal varies based on device and disease states. Performed at [facility], [address] [place], [place] [address]   APTT     Status: None   Collection Time: 01/30/23 11:54 AM  Result Value Ref Range   aPTT 30 24 - 36 seconds    Comment: Performed at [facility], [address],  [place], [place] [address]  CBC     Status: Abnormal   Collection Time: 01/30/23 11:54 AM  Result Value Ref Range   WBC 13.7 (H) 4.0 - 10.5 K/uL   RBC 3.77 (L) 3.87 - 5.11 MIL/uL   Hemoglobin 10.2 (L) 12.0 - 15.0 g/dL   HCT 60.4 (L) 54.0 - 98.1 %   MCV 87.8 80.0 - 100.0 fL   MCH 27.1 26.0 - 34.0 pg   MCHC 30.8 30.0 - 36.0 g/dL   RDW 19.1 47.8 - 29.5 %   Platelets 377 150 - 400 K/uL   nRBC 0.0 0.0 - 0.2 %    Comment: Performed at [facility], [address] [place], [place] [address]  Differential     Status: Abnormal   Collection Time: 01/30/23 11:54 AM  Result Value Ref Range   Neutrophils Relative % 72 %   Neutro Abs 10.0 (H)  1.7 - 7.7 K/uL   Lymphocytes Relative 15 %   Lymphs Abs 2.1 0.7 - 4.0 K/uL   Monocytes Relative 8 %   Monocytes Absolute 1.1 (H) 0.1 - 1.0 K/uL   Eosinophils Relative 3 %   Eosinophils Absolute 0.4 0.0 - 0.5 K/uL   Basophils Relative 1 %   Basophils Absolute 0.1 0.0 - 0.1 K/uL   Immature Granulocytes 1 %   Abs Immature Granulocytes 0.07 0.00 - 0.07 K/uL    Comment: Performed at [facility], [address] [place], [place] [address]  Comprehensive metabolic panel     Status: Abnormal   Collection Time: 01/30/23 11:54 AM  Result Value Ref Range   Sodium 141 135 - 145 mmol/L   Potassium 4.1 3.5 - 5.1 mmol/L   Chloride 105 98 - 111 mmol/L   CO2 24 22 - 32 mmol/L   Glucose, Bld 93 70 - 99 mg/dL    Comment: Glucose reference range applies only to samples taken after fasting for at least 8 hours.   BUN 55 (H) 8 - 23 mg/dL   Creatinine, Ser 4.69 (H) 0.44 - 1.00 mg/dL   Calcium 62.9 8.9 - 52.8 mg/dL   Total Protein 8.2 (H) 6.5 - 8.1 g/dL   Albumin 2.6 (L) 3.5 - 5.0 g/dL   AST 35 15 - 41 U/L   ALT 21 0 - 44 U/L   Alkaline Phosphatase 68 38 - 126 U/L   Total Bilirubin 0.7 0.3 - 1.2 mg/dL   GFR, Estimated 22 (L) >60 mL/min    Comment: (NOTE) Calculated using the CKD-EPI Creatinine Equation (2021)    Anion gap 12 5 - 15    Comment: Performed at [facility], [address] [place], [place] [address]  Ammonia     Status: None   Collection Time: 01/30/23 11:54 AM  Result Value Ref Range   Ammonia 17 9 - 35 umol/L    Comment: Performed at [facility], [address] [place], [place] [address]  Urine rapid drug screen (hosp performed)     Status: None   Collection Time: 01/30/23 11:57 AM  Result Value Ref Range   Opiates NONE DETECTED NONE DETECTED   Cocaine NONE DETECTED NONE DETECTED   Benzodiazepines NONE DETECTED NONE DETECTED   Amphetamines NONE DETECTED NONE DETECTED   Tetrahydrocannabinol NONE DETECTED NONE DETECTED   Barbiturates NONE DETECTED NONE  DETECTED    Comment: (NOTE) DRUG SCREEN FOR MEDICAL PURPOSES ONLY.  IF CONFIRMATION IS NEEDED FOR ANY PURPOSE, NOTIFY LAB WITHIN 5  DAYS.  LOWEST DETECTABLE LIMITS FOR URINE DRUG SCREEN Drug Class                     Cutoff (ng/mL) Amphetamine and metabolites    1000 Barbiturate and metabolites    200 Benzodiazepine                 200 Opiates and metabolites        300 Cocaine and metabolites        300 THC                            50 Performed at [facility], [address], [place], [place] [address]   Urinalysis, w/ Reflex to Culture (Infection Suspected) -Urine, Clean Catch     Status: Abnormal   Collection Time: 01/30/23 11:57 AM  Result Value Ref Range   Specimen Source URINE, CLEAN CATCH    Color, Urine AMBER (A) YELLOW    Comment: BIOCHEMICALS MAY BE AFFECTED BY COLOR   APPearance CLEAR CLEAR   Specific Gravity, Urine 1.015 1.005 - 1.030   pH 5.0 5.0 - 8.0   Glucose, UA NEGATIVE NEGATIVE mg/dL   Hgb urine dipstick NEGATIVE NEGATIVE   Bilirubin Urine NEGATIVE NEGATIVE   Ketones, ur NEGATIVE NEGATIVE mg/dL   Protein, ur NEGATIVE NEGATIVE mg/dL   Nitrite NEGATIVE NEGATIVE   Leukocytes,Ua NEGATIVE NEGATIVE   RBC / HPF 11-20 0 - 5 RBC/hpf   WBC, UA 0-5 0 - 5 WBC/hpf    Comment:        Reflex urine culture not performed if WBC <=10, OR if Squamous epithelial cells >5. If Squamous epithelial cells >5 suggest recollection.    Bacteria, UA NONE SEEN NONE SEEN   Squamous Epithelial / HPF 0-5 0 - 5 /HPF   Hyaline Casts, UA PRESENT     Comment: Performed at [facility], [address], [place], [place] [address]  Resp panel by RT-PCR (RSV, Flu A&B, Covid) Anterior Nasal Swab     Status: None   Collection Time: 01/30/23 12:00 PM   Specimen: Anterior Nasal Swab  Result Value Ref Range   SARS Coronavirus 2 by RT PCR NEGATIVE NEGATIVE   Influenza A by PCR NEGATIVE NEGATIVE   Influenza B by PCR NEGATIVE NEGATIVE    Comment: (NOTE) The Xpert Xpress  SARS-CoV-2/FLU/RSV plus assay is intended as an aid in the diagnosis of influenza from Nasopharyngeal swab specimens and should not be used as a sole basis for treatment. Nasal washings and aspirates are unacceptable for Xpert Xpress SARS-CoV-2/FLU/RSV testing.  Fact Sheet for Patients: BloggerCourse.com  Fact Sheet for Healthcare Providers: SeriousBroker.it  This test is not yet approved or cleared by the [place] FDA and has been authorized for detection and/or diagnosis of SARS-CoV-2 by FDA under an Emergency Use Authorization (EUA). This EUA will remain in effect (meaning this test can be used) for the duration of the COVID-19 declaration under Section 564(b)(1) of the Act, 21 U.S.C. section 360bbb-3(b)(1), unless the authorization is terminated or revoked.     Resp Syncytial Virus by PCR NEGATIVE NEGATIVE    Comment: (NOTE) Fact Sheet for Patients: BloggerCourse.com  Fact Sheet for Healthcare Providers: SeriousBroker.it  This test is not yet approved or cleared by the [place] FDA and has been authorized for detection and/or diagnosis of SARS-CoV-2 by FDA under an Emergency Use Authorization (EUA). This EUA will remain in  effect (meaning this test can be used) for the duration of the COVID-19 declaration under Section 564(b)(1) of the Act, 21 U.S.C. section 360bbb-3(b)(1), unless the authorization is terminated or revoked.  Performed at [facility], [address] [place], [place] [address]   I-Stat venous blood gas, [facility], [facility], [facility])     Status: Abnormal   Collection Time: 01/30/23 12:37 PM  Result Value Ref Range   pH, Ven 7.346 7.25 - 7.43   pCO2, Ven 48.5 44 - 60 mmHg   pO2, Ven 38 32 - 45 mmHg   Bicarbonate 26.5 20.0 - 28.0 mmol/L   TCO2 28 22 - 32 mmol/L   O2 Saturation 68 %   Acid-Base Excess 0.0 0.0 - 2.0 mmol/L   Sodium 144 135 - 145  mmol/L   Potassium 4.0 3.5 - 5.1 mmol/L   Calcium, Ion 1.34 1.15 - 1.40 mmol/L   HCT 33.0 (L) 36.0 - 46.0 %   Hemoglobin 11.2 (L) 12.0 - 15.0 g/dL   Sample type VENOUS    Comment NOTIFIED PHYSICIAN    CT HEAD WO CONTRAST  Result Date: 01/30/2023 CLINICAL DATA:  Decreased activity, recent pneumonia, history of dementia EXAM: CT HEAD WITHOUT CONTRAST TECHNIQUE: Contiguous axial images were obtained from the base of the skull through the vertex without intravenous contrast. RADIATION DOSE REDUCTION: This exam was performed according to the departmental dose-optimization program which includes automated exposure control, adjustment of the mA and/or kV according to patient size and/or use of iterative reconstruction technique. COMPARISON:  None Available. FINDINGS: Brain: Focal hypodensity in the right occipital lobe (series 3, image 16 and series 6, image 23), which may represent a subacute infarct. No evidence of acute hemorrhage, mass, mass effect, or midline shift. No hydrocephalus or extra-axial fluid collection. Ventriculomegaly, likely related to central atrophy, which is slightly more prominent left temporal lobe. Periventricular white matter changes, likely the sequela of chronic small vessel ischemic disease. Vascular: No hyperdense vessel. Skull: Negative for fracture or focal lesion. Sinuses/Orbits: No acute finding. Other: The mastoid air cells are well aerated. IMPRESSION: Focal hypodensity in the right occipital lobe, which is technically age indeterminate but may represent a subacute infarct. An MRI is recommended for further evaluation. No acute intracranial hemorrhage. These results were called by telephone at the time of interpretation on 01/30/2023 at 1:53 pm to provider [facility] , who verbally acknowledged these results. Electronically Signed   By: Wiliam Ke M.D.   On: 01/30/2023 13:53   DG Chest Portable 1 View  Result Date: 01/30/2023 CLINICAL DATA:  Confusion EXAM: PORTABLE CHEST  1 VIEW COMPARISON:  Radiograph 11/29/2022 FINDINGS: Unchanged enlarged cardiac silhouette. There is no focal airspace consolidation. There is no pleural effusion or evidence of pneumothorax. Mild thoracic spondylosis. No acute osseous abnormality. IMPRESSION: No evidence of acute cardiopulmonary disease. Electronically Signed   By: Caprice Renshaw M.D.   On: 01/30/2023 12:57    Pending Labs Unresulted Labs (From admission, onward)     Start     Ordered   01/31/23 0500  Basic metabolic panel  Tomorrow morning,   R        01/30/23 1606   01/31/23 0500  CBC  Tomorrow morning,   R        01/30/23 1606   01/30/23 1625  Magnesium  Add-on,   AD        01/30/23 1624   01/30/23 1610  Vitamin B12  Add-on,   AD  01/30/23 1610   01/30/23 1610  TSH  Add-on,   AD        01/30/23 1610            Vitals/Pain Today's Vitals   01/30/23 1145 01/30/23 1543 01/30/23 1600 01/30/23 1610  BP:  (!) 142/85 (!) 131/112   Pulse:  (!) 102 (!) 103   Resp:  19 (!) 24   Temp:    97.8 F (36.6 C)  TempSrc:    Oral  SpO2:  94% 93%   Weight: 79 kg     Height:  (1.6 m)     PainSc:        Isolation Precautions No active isolations  Medications Medications  sodium chloride 0.9 % bolus 500 mL (0 mLs Intravenous Stopped 01/30/23 1250)    Followed by  0.9 %  sodium chloride infusion (100 mL/hr Intravenous New Bag/Given 01/30/23 1302)  acetaminophen (TYLENOL) tablet 975 mg (has no administration in time range)  simvastatin (ZOCOR) tablet 40 mg (has no administration in time range)  mirtazapine (REMERON) tablet 7.5 mg (has no administration in time range)  QUEtiapine (SEROQUEL) tablet 12.5 mg (has no administration in time range)  polyethylene glycol (MIRALAX / GLYCOLAX) packet 17 g (17 g Oral Not Given 01/30/23 1625)  budesonide (PULMICORT) nebulizer solution 0.5 mg (has no administration in time range)  heparin injection 5,000 Units (has no administration in time range)  guaiFENesin (MUCINEX) 12 hr  tablet 1,200 mg (has no administration in time range)  labetalol (NORMODYNE) injection 5 mg (has no administration in time range)  sodium chloride 0.9 % bolus 250 mL (0 mLs Intravenous Stopped 01/30/23 1543)  diazepam (VALIUM) injection 5 mg (5 mg Intravenous Given 01/30/23 1610)    Mobility non-ambulatory     Focused Assessments    R Recommendations: See Admitting Provider Note  Report given to:   Additional Notes:

## 2023-01-30 NOTE — ED Provider Notes (Signed)
[place] EMERGENCY DEPARTMENT AT [facility] Provider Note   CSN: [id number] Arrival date & time: 01/30/23  1136     History {Add pertinent medical, surgical, social history, OB history to HPI:1} Chief Complaint  Patient presents with   Altered Mental Status    Susan Collins is a 77 y.o. female.   Altered Mental Status    Patient has history of dementia, hypercholesterolemia, diverticular disease, pneumonia.  She is a resident of a nursing facility.  Patient's had a decline in ental status over the last couple weeks.  Patient was diagnosed with pneumonia 2 weeks ago.  She has been taking antibiotics and steroids.  Per the nursing facility she has had decreased activity over the past week and was sent to the ED for further evaluation.  Patient attempts to answer questions but it is very hard to understand her.  Unable to get clear additional history from her  Home Medications Prior to Admission medications   Medication Sig Start Date End Date Taking? Authorizing Provider  acetaminophen (TYLENOL) 650 MG CR tablet Take 1,300 mg by mouth every 8 (eight) hours as needed for pain. 11/16/22  Yes [provider]  Vitamins A & D (VITAMIN A & D) ointment Apply 1 Application topically See admin instructions. Apply to buttocks every shift and as needed after each incontinent episode.   Yes [provider]  albuterol (PROVENTIL) (2.5 MG/3ML) 0.083% nebulizer solution TAKE 3 MLS BY NEBULIZATION EVERY 6 (SIX) HOURS AS NEEDED FOR WHEEZING OR SHORTNESS OF BREATH. Patient taking differently: Take 2.5 mg by nebulization every 6 (six) hours as needed for wheezing or shortness of breath. TAKE 3 MLS BY NEBULIZATION EVERY 6 (SIX) HOURS AS NEEDED FOR WHEEZING OR SHORTNESS OF BREATH. 12/09/21   Coralyn Helling, MD  albuterol (VENTOLIN HFA) 108 (90 Base) MCG/ACT inhaler INHALE TWO PUFFS BY MOUTH EVERY 6 HOURS AS NEEDED Patient taking differently: Inhale 2 puffs into the lungs every 4  (four) hours as needed for wheezing or shortness of breath. INHALE TWO PUFFS BY MOUTH EVERY 6 HOURS AS NEEDED 03/07/22   Cobb, Ruby Cola, NP  ammonium lactate (AMLACTIN) 12 % lotion Apply 1 application. topically as needed for dry skin. 12/26/21   McDonald, Rachelle Hora, DPM  Ascorbic Acid (VITAMIN C) 1000 MG tablet Take 1,000 mg by mouth daily. 11/17/22   [provider]  calcium carbonate (OS-CAL - DOSED IN MG OF ELEMENTAL CALCIUM) 1250 (500 Ca) MG tablet Take 1 tablet by mouth daily.    [provider]  Cholecalciferol (VITAMIN D3) 1000 units CAPS Take 1,000 Units by mouth daily.    [provider]  Fluticasone-Umeclidin-Vilant (TRELEGY ELLIPTA) 100-62.5-25 MCG/ACT AEPB Inhale 1 puff into the lungs daily. 08/02/21   Coralyn Helling, MD  furosemide (LASIX) 20 MG tablet Take 40 mg by mouth daily. 11/14/22   [provider]  ibandronate (BONIVA) 150 MG tablet Take 150 mg by mouth every 30 (thirty) days. 11/17/22   [provider]  montelukast (SINGULAIR) 10 MG tablet Take 1 tablet (10 mg total) by mouth at bedtime. 07/25/22   Cobb, Ruby Cola, NP  polyethylene glycol (MIRALAX / GLYCOLAX) 17 g packet Take 17 g by mouth daily.    [provider]  simvastatin (ZOCOR) 40 MG tablet Take 1 tablet (40 mg total) by mouth daily. 05/20/18   Gordy Savers, MD      Allergies    Sulfonamide derivatives    Review of Systems   Review  of Systems  Physical Exam Updated Vital Signs BP (!) 122/105 (BP Location: Left Arm)   Pulse (!) 110   Temp 97.6 F (36.4 C) (Oral)   Resp 17   Ht 1.6 m (5\' 3" )   Wt 79 kg   SpO2 95%   BMI 30.85 kg/m  Physical Exam Vitals and nursing note reviewed.  Constitutional:      Appearance: She is ill-appearing.     Comments: Elderly, frail  HENT:     Head: Normocephalic and atraumatic.     Right Ear: External ear normal.     Left Ear: External ear normal.     Mouth/Throat:     Mouth: Mucous membranes are dry.  Eyes:      General: No scleral icterus.       Right eye: No discharge.        Left eye: No discharge.     Conjunctiva/sclera: Conjunctivae normal.  Neck:     Trachea: No tracheal deviation.  Cardiovascular:     Rate and Rhythm: Normal rate and regular rhythm.  Pulmonary:     Effort: Pulmonary effort is normal. No respiratory distress.     Breath sounds: Normal breath sounds. No stridor. No wheezing or rales.  Abdominal:     General: Bowel sounds are normal. There is no distension.     Palpations: Abdomen is soft.     Tenderness: There is no abdominal tenderness. There is no guarding or rebound.  Musculoskeletal:        General: No tenderness or deformity.     Cervical back: Neck supple.     Right lower leg: Edema present.     Left lower leg: Edema present.     Comments: Mild edema bilateral lower extremities  Skin:    General: Skin is warm and dry.     Findings: No rash.  Neurological:     General: No focal deficit present.     Mental Status: She is alert.     Cranial Nerves: No cranial nerve deficit, dysarthria or facial asymmetry.     Sensory: No sensory deficit.     Motor: Weakness present. No abnormal muscle tone or seizure activity.     Coordination: Coordination normal.     Comments: Patient does answer some my questions but not consistently.  Unable to tell me the date or her location  Psychiatric:        Mood and Affect: Mood normal.     ED Results / Procedures / Treatments   Labs (all labs ordered are listed, but only abnormal results are displayed) Labs Reviewed  I-STAT VENOUS BLOOD GAS, ED - Abnormal; Notable for the following components:      Result Value   HCT 33.0 (*)    Hemoglobin 11.2 (*)    All other components within normal limits  RESP PANEL BY RT-PCR (RSV, FLU A&B, COVID)  [id number]  ETHANOL  PROTIME-INR  APTT  CBC  DIFFERENTIAL  COMPREHENSIVE METABOLIC PANEL  RAPID URINE DRUG SCREEN, HOSP PERFORMED  URINALYSIS, W/ REFLEX TO CULTURE (INFECTION SUSPECTED)   AMMONIA  I-STAT CHEM 8, ED    EKG EKG Interpretation  Date/Time:  Friday January 30 2023 12:18:31 EDT Ventricular Rate:  100 PR Interval:  34 QRS Duration: 98 QT Interval:  348 QTC Calculation: 449 R Axis:   -50 Text Interpretation: Sinus tachycardia Right atrial enlargement Left anterior fascicular block Anterior infarct, old No significant change since last tracing Artifact Confirmed by Linwood Dibbles [phone]) on  01/30/2023 12:39:11 PM  Radiology No results found.  Procedures Procedures  {Document cardiac monitor, telemetry assessment procedure when appropriate:1}  Medications Ordered in ED Medications  sodium chloride 0.9 % bolus 500 mL (500 mLs Intravenous New Bag/Given 01/30/23 1206)    Followed by  0.9 %  sodium chloride infusion (has no administration in time range)    ED Course/ Medical Decision Making/ A&P   {   Click here for ABCD2, HEART and other calculatorsREFRESH Note before signing :1}                          Medical Decision Making Amount and/or Complexity of Data Reviewed Labs: ordered. Radiology: ordered.  Risk Prescription drug management.   ***  {Document critical care time when appropriate:1} {Document review of labs and clinical decision tools ie heart score, Chads2Vasc2 etc:1}  {Document your independent review of radiology images, and any outside records:1} {Document your discussion with family members, caretakers, and with consultants:1} {Document social determinants of health affecting pt's care:1} {Document your decision making why or why not admission, treatments were needed:1} Final Clinical Impression(s) / ED Diagnoses Final diagnoses:  None    Rx / DC Orders ED Discharge Orders     None

## 2023-01-30 NOTE — ED Notes (Signed)
Pt has significant skin breakdown to sacral area found during straight cath. There are spots of redness as well as spots that are bleeding. There was no dressing on it from the nursing home. MD Lynelle Doctor notified and aware

## 2023-01-30 NOTE — ED Notes (Signed)
Pt to MRI

## 2023-01-31 ENCOUNTER — Inpatient Hospital Stay (HOSPITAL_COMMUNITY): Payer: Medicare HMO

## 2023-01-31 DIAGNOSIS — N179 Acute kidney failure, unspecified: Secondary | ICD-10-CM | POA: Diagnosis not present

## 2023-01-31 DIAGNOSIS — M7989 Other specified soft tissue disorders: Secondary | ICD-10-CM

## 2023-01-31 DIAGNOSIS — I5031 Acute diastolic (congestive) heart failure: Secondary | ICD-10-CM

## 2023-01-31 LAB — CBC
HCT: 29.2 % — ABNORMAL LOW (ref 36.0–46.0)
Hemoglobin: 9.4 g/dL — ABNORMAL LOW (ref 12.0–15.0)
MCH: 27.6 pg (ref 26.0–34.0)
MCHC: 32.2 g/dL (ref 30.0–36.0)
MCV: 85.6 fL (ref 80.0–100.0)
Platelets: 303 10*3/uL (ref 150–400)
RBC: 3.41 MIL/uL — ABNORMAL LOW (ref 3.87–5.11)
RDW: 15 % (ref 11.5–15.5)
WBC: 10.7 10*3/uL — ABNORMAL HIGH (ref 4.0–10.5)
nRBC: 0 % (ref 0.0–0.2)

## 2023-01-31 LAB — BASIC METABOLIC PANEL
Anion gap: 11 (ref 5–15)
BUN: 47 mg/dL — ABNORMAL HIGH (ref 8–23)
CO2: 23 mmol/L (ref 22–32)
Calcium: 9.3 mg/dL (ref 8.9–10.3)
Chloride: 109 mmol/L (ref 98–111)
Creatinine, Ser: 1.63 mg/dL — ABNORMAL HIGH (ref 0.44–1.00)
GFR, Estimated: 32 mL/min — ABNORMAL LOW (ref >60)
Glucose, Bld: 82 mg/dL (ref 70–99)
Potassium: 3.5 mmol/L (ref 3.5–5.1)
Sodium: 143 mmol/L (ref 135–145)

## 2023-01-31 LAB — ECHOCARDIOGRAM COMPLETE
AR max vel: 2.45 cm2
AV Area VTI: 2.33 cm2
AV Area mean vel: 2.38 cm2
AV Mean grad: 6 mmHg
AV Peak grad: 10.5 mmHg
Ao pk vel: 1.62 m/s
Area-P 1/2: 3.24 cm2
Height: 63 in
S' Lateral: 2 cm
Weight: 2786.61 oz

## 2023-01-31 NOTE — Assessment & Plan Note (Signed)
Given the patient's overall poor quality of life, and failure to respond to fluids and antibiotics, as well as her failure to identify any reversible cause of her deterioration, and the patient's expressed wishes to be allowed a natural death, met with family today who expressed wish to stop hospital level interventions, and allow natural course. -Stop IV fluids - Stop IV antibiotics - Consult hospice  The patient has pain with any nursing manipulation, requiring analgesics.  She is also refusing all oral intake or medicines by mouth.

## 2023-01-31 NOTE — Assessment & Plan Note (Signed)
This appears to be advancing - Recommend Palliative care at d/c

## 2023-01-31 NOTE — Progress Notes (Signed)
PT Cancellation Note  Patient Details Name: Susan Collins MRN: [id number] DOB: 1945/11/09   Cancelled Treatment:    Reason Eval/Treat Not Completed: PT screened, no needs identified, will sign off Pt off floor for test, will f/u as able.  Anise Salvo, PT Acute Rehab [facility], [facility], [facility] [phone]   [name] 01/31/2023, 10:32 AM

## 2023-01-31 NOTE — Hospital Course (Signed)
Mrs. Strack is a 77 y.o. F with dementia, lives in ALF for last 3 months, progressive and now mostly unable to walk, advanced memory loss, also COPD, and prior CVA who presneted with decreased responsiveness.   In the ER, Cr up to 2.2, appeared dehydrated.  CT showed inconclusive abnormality.

## 2023-01-31 NOTE — Plan of Care (Signed)
  Problem: Education: Goal: Knowledge of General Education information will improve Description: Including pain rating scale, medication(s)/side effects and non-pharmacologic comfort measures Outcome: Not Progressing   Problem: Health Behavior/Discharge Planning: Goal: Ability to manage health-related needs will improve Outcome: Not Progressing   Problem: Clinical Measurements: Goal: Respiratory complications will improve Outcome: Not Progressing   Problem: Activity: Goal: Risk for activity intolerance will decrease Outcome: Not Progressing   Problem: Nutrition: Goal: Adequate nutrition will be maintained Outcome: Not Progressing   Problem: Coping: Goal: Level of anxiety will decrease Outcome: Not Progressing   Problem: Skin Integrity: Goal: Risk for impaired skin integrity will decrease Outcome: Not Progressing

## 2023-01-31 NOTE — Progress Notes (Signed)
  Progress Note   Patient: Susan Collins GUY:[id number] DOB: 13-Apr-1946 DOA: 01/30/2023     1 DOS: the patient was seen and examined on 01/31/2023        Brief hospital course: Mrs. Fry is a 77 y.o. F with dementia, lives in ALF for last 3 months, progressive and now mostly unable to walk, advanced memory loss, also COPD, and prior CVA who presneted with decreased responsiveness.   In the ER, Cr up to 2.2, appeared dehydrated.  CT showed inconclusive abnormality.     Assessment and Plan: * AKI (acute kidney injury) Cr 2.2 on admission, improved to 1.6 with fluids - Continue IV fluids  Dementia This appears to be advancing - Recommend Palliative care at d/c  Acute encephalopathy Due to dehydration in setting of dementia.          Subjective: Weak and tired, patient has no complaints, no nursing concerns.     Physical Exam: BP (!) 103/53 (BP Location: Right Arm)   Pulse (!) 106   Temp 99.4 F (37.4 C)   Resp 18   Ht  (1.6 m)   Wt 79 kg   SpO2 92%   BMI 30.85 kg/m   Frail elderly female, lying in bed, arouses and responds to questions, but is disoriented, recognizes her daughter only RRR, no murmurs, no peripheral edema Respiratory rate normal, lungs clear without rales or wheezes Abdomen soft without tenderness palpation or guarding, no ascites or distention  Data Reviewed: Creatinine improving White Blood cell count down to normal  Family Communication: Daughter at the bedside    Disposition: Status is: Inpatient         Author: Alberteen Sam, MD 01/31/2023 5:54 PM  For on call review [email or web address].

## 2023-01-31 NOTE — Progress Notes (Signed)
SLP Cancellation Note  Patient Details Name: Susan Collins MRN: [id number] DOB: April 07, 1946   Cancelled treatment:       Reason Eval/Treat Not Completed: Patient at procedure or test/unavailable. SLP will follow for availability/readiness.  Angela Nevin, MA, CCC-SLP Speech Therapy

## 2023-01-31 NOTE — Evaluation (Signed)
Physical Therapy Evaluation Patient Details Name: Susan Collins MRN: [id number] DOB: Oct 18, 1945 Today's Date: 01/31/2023  History of Present Illness  Pt is 77 yo female sent from nursing home with worsening mentation on 01/30/23.  Pt with AKI and metabolic encephalopathy no clear etiology.  Noted in MD note that if no clear improvement in 24-48 hours family open to palliative/hospice consult. Pt with COPD and advanced dementia.  Clinical Impression  Pt admitted with above diagnosis. Pt is from a memory care facility and has steadily declining mobility since January.  Daughter reports pt mostly max A pivot to chair since January and then per chart bed bound past few weeks. Daughter states pt yells and seems to be hurting/fear of falling with any movement.   Today, pt very limited with therapy.  She was not able to follow any commands and resisted all extremity movement. Pt yelling with any movement "Tammy let me rest, leave me alone, don't make me mad, ouch."  Required total A of 2 to reposition in bed and not able to further progress.  At this point, pt does not have skillable rehab potential and is hurting/fearful with all movement with likely no benefit from therapy.   Will sign off at this time.  If pt improves please reconsult if needed.  Discussed with daughter who is in agreement.      Recommendations for follow up therapy are one component of a multi-disciplinary discharge planning process, led by the attending physician.  Recommendations may be updated based on patient status, additional functional criteria and insurance authorization.  Follow Up Recommendations Can patient physically be transported by private vehicle: No     Assistance Recommended at Discharge Frequent or constant Supervision/Assistance  Patient can return home with the following  Two people to help with walking and/or transfers;Two people to help with bathing/dressing/bathroom    Equipment Recommendations     Recommendations for Other Services       Functional Status Assessment Patient has not had a recent decline in their functional status     Precautions / Restrictions Precautions Precautions: Fall Precaution Comments: pain      Mobility  Bed Mobility Overal bed mobility: Needs Assistance Bed Mobility: Rolling Rolling: Total assist, +2 for physical assistance         General bed mobility comments: Total x 2 for partial roll and repositioning with pt yelling to leave her alone    Transfers                        Ambulation/Gait                  Stairs            Wheelchair Mobility    Modified Rankin (Stroke Patients Only)       Balance       Sitting balance - Comments: unable to test                                     Pertinent Vitals/Pain Pain Assessment Pain Assessment: Faces Faces Pain Scale: Hurts even more Pain Location: At rest appears comfortable; with any movement pt grimaces and says to leave her alone she hurts Pain Intervention(s): Limited activity within patient's tolerance, Monitored during session    Home Living Family/patient expects to be discharged to:: Assisted living [facility])  Home Equipment: Agricultural consultant (2 wheels);Shower seat Additional Comments: Pt recently moved to [facility], memory care unit. Per pt's daughter pt has access to all needed DME and staff checks in every 2 hours. Pt has a roommate.    Prior Function Prior Level of Function : Needs assist             Mobility Comments: Has not walked since January and needed assistance to get to chair (1-2 people max assist); mostly bed bound past couple weeks; daughter reports large fear of falling and pt yells that she hurts; sleeps in recliner ADLs Comments: Per pt's daughter - staff assists with all ADL, mobility and IADLs. assist for weakness and baseline dementia     Hand Dominance   Dominant  Hand: Right    Extremity/Trunk Assessment   Upper Extremity Assessment Upper Extremity Assessment: Difficult to assess due to impaired cognition;LUE deficits/detail;RUE deficits/detail RUE Deficits / Details: Pt resistive to any movement and yells to leave her alone and let rest; tried to encourage spontaneous movement with reaching but pt not participating LUE Deficits / Details: Pt resistive to any movement and yells to leave her alone and let rest;tried to encourage spontaneous movement with reaching but pt not participating    Lower Extremity Assessment Lower Extremity Assessment: Difficult to assess due to impaired cognition;RLE deficits/detail;LLE deficits/detail RLE Deficits / Details: Pt resistive to any movement.  Pt with prevalon boots in place.  Able to get near neutral dorsiflexion on R.  Knee ROM ~0 to 30 degrees but pt resisting. LLE Deficits / Details: Pt resistive to any movement. Pt with prevalon boots in place. Tight heel cord on L lacking 15-20 degrees to neutral. Knee ROM ~0 to 30 degrees but pt resisting.    Cervical / Trunk Assessment Cervical / Trunk Assessment: Other exceptions Cervical / Trunk Exceptions: Pt in supine difficult to assist; keeps head turned L - tried to encourage to look R and tried PROM but pt resisting and stating "Tammy, leave me alone, you let me rest and don't make me mad." (Tammy is daughter)  Communication   Communication: HOH  Cognition Arousal/Alertness: Lethargic Behavior During Therapy: Agitated, WFL for tasks assessed/performed (agitated wtih movement; at rest comfortable) Overall Cognitive Status: Impaired/Different from baseline                                 General Comments: Pt with hx of advanced dementia with worsening confusion over past month.  Pt not able to follow any commands; states she just wants to sleep and to leave her alone.        General Comments      Exercises     Assessment/Plan    PT  Assessment Patient does not need any further PT services  PT Problem List         PT Treatment Interventions      PT Goals (Current goals can be found in the Care Plan section)  Acute Rehab PT Goals Patient Stated Goal: "leave me alone, let me rest" PT Goal Formulation: All assessment and education complete, DC therapy    Frequency       Co-evaluation               AM-PAC PT "6 Clicks" Mobility  Outcome Measure Help needed turning from your back to your side while in a flat bed without using bedrails?: Total Help needed moving from lying  on your back to sitting on the side of a flat bed without using bedrails?: Total Help needed moving to and from a bed to a chair (including a wheelchair)?: Total Help needed standing up from a chair using your arms (e.g., wheelchair or bedside chair)?: Total Help needed to walk in hospital room?: Total Help needed climbing 3-5 steps with a railing? : Total 6 Click Score: 6    End of Session   Activity Tolerance: Patient limited by pain;Other (comment) (and cogntion) Patient left: in bed;with call bell/phone within reach;with bed alarm set;with family/visitor present Nurse Communication: Mobility status PT Visit Diagnosis: Muscle weakness (generalized) (M62.81)    Time: 1610-9604 PT Time Calculation (min) (ACUTE ONLY): 16 min   Charges:   PT Evaluation $PT Eval Low Complexity: 1 Low          Iretha Kirley, PT [facility] [phone]   [name] 01/31/2023, 2:42 PM

## 2023-01-31 NOTE — Consult Note (Signed)
WOC Nurse Consult Note: Reason for Consult:Patient with wounds noted upon admission to buttocks, posterior right tibial and bilateral heels.    Wound type: Pressure, trauma Pressure Injury POA: Yes Measurement:Per Nursing Flow Sheet on Admission Right buttock (DTPI): 10cm x 7.6cm Left Buttock (DTPI):10cm x 7.6cm Right heel (Stage 1): nonblanching erythema, no open wound Left heel (Unstageable): 2cm round Right posterior tibia (full thickness): 2cm round Dressing procedure/placement/frequency: I have provided Nursing with guidance in the care of the skin lesions using turning and repositioning to minimize time in the supine position, cleansing of wounds with NS, and covering with xeroform gauze (antimicrobial, nonadherent). The dressings to the buttocks and heels are to be secured with silicone foam dressings, the wound to the right posterior tibial is to be secured with a few turns of Kerlix roll gauze/paper tape.Bilateral Prevalon boots are provided.   A mattress replacement with low air loss feature is provided and DermaTherapy low friction coefficient bedlinen system is in place..  WOC nursing team will not follow, but will remain available to this patient, the nursing and medical teams.  Please re-consult if needed.  Thank you for inviting [place] to participate in this patient's Plan of Care.  Ladona Mow, MSN, RN, CNS, GNP, Leda Min, [employer], [employer] phone:  [phone]

## 2023-01-31 NOTE — Evaluation (Signed)
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 female with medical history significant of COPD, advanced dementia, sent from nursing home for duration of worsening of mentations. She was living at home prior to this.  Reason for admission to assisted living due to worsening dementia and falls at home. Family reports that the patient at baseline has advanced dementia, at baseline she is able to engage in conversation however very forgetful and slowly gradually related, family found the patient has had cognition declining over the last 1+ month.  Last 2 weeks, patient's mentation further declined, stopped feeding herself, has to be fed by either family member or nursing home staff.  Pt is currently NPO following admission to the ED yesterday. The pt had a CXR on 4/19, the impression from this was that there is no evidence of acute cardiopulmonary disease. According to the daughter, she has reduced appetite and extreme xerostomia which has made putting dentures difficult.    Assessment / Plan / Recommendation  Clinical Impression  Pt seen for BSE to determine PO  readiness. The pt is currently NPO and was assessed with thin liquid and puree. The pt was given oral care following the pts daughters reports of dry mouth, which she reports has prohibited the pt from having her dentures in for the past few weeks. The pt was given minimal swabbing to clear dried secretions, pt did not tolerate it past a few swabs. The pts OME revealed a general weakness of the labial muscles, suspected due to level of alertness. The pt was unable to elicit a cough and follow one step directions consistently due to dementia dx. The pt was given thin liquid via spoon, where she had mild anterior loss initially (suspected due to lethargy level), but the pt became more awake and alert with every PO trial ("This tastes so good" "I'm glad you're doing this"). The pt was given thin liquid via spoon x2 with no overt s/s of aspiration (minus the initial anterior loss) and was initially unable to get any water via straw but after more PO trials was able to take a sequential sip with a dry delayed cough. The pt had no overt s/s of aspiration with puree but did require verbal cueing to swallow the bolus. When asked if the pt would like to trial advancing solids, the pt responded "I don't think I can do that". Given the pts lack of dentition, energy levels, and results of the BSE, safest diet recs at this time are Dysphagia 1/ thin liquid with assist from family or staff for all PO intake with STRICT aspiration precautions (sit upright for PO  intake and 30 minutes following, small bites and sips, reduced rate of intake, small sips of water every couple of bites). SLP to follow up to assess success with diet recs while admitted. SLP Visit Diagnosis: Dysphagia, unspecified (R13.10)    Aspiration Risk  Mild aspiration risk    Diet Recommendation Dysphagia 1 (Puree);Thin liquid   Liquid Administration via: Straw;Spoon Medication Administration: Crushed with puree Supervision: Staff to assist with self  feeding;Full supervision/cueing for compensatory strategies Compensations: Minimize environmental distractions;Slow rate;Small sips/bites Postural Changes: Seated upright at 90 degrees;Remain upright for at least 30 minutes after po intake    Other  Recommendations Oral Care Recommendations: Oral care BID    Recommendations for follow up therapy are one component of a multi-disciplinary discharge planning process, led by the attending physician.  Recommendations may be updated based on patient status, additional functional criteria and insurance authorization.     Assistance Recommended at Discharge    Functional Status Assessment Patient has had a recent decline in their functional status and demonstrates the ability to make significant improvements in function in a reasonable and predictable amount of time.  Frequency and Duration min 2x/week  1 week       Prognosis Prognosis for improved oropharyngeal function: Good Barriers to Reach Goals: Cognitive deficits      Swallow Study   General Date of Onset: 01/31/23 HPI: Susan Collins is a 77 y.o. female with medical history significant of COPD, advanced dementia, sent from nursing home for duration of worsening of mentations. She was living at home prior to this.  Reason for admission to assisted living due to worsening dementia and falls at home. Family reports that the patient at baseline has advanced dementia, at baseline she is able to engage in conversation however very forgetful and slowly gradually related, family found the patient has had cognition declining over the last 1+ month.  Last 2 weeks, patient's mentation further declined, stopped feeding herself, has to be fed by either family member or nursing home staff.  Pt is currently NPO following admission to the ED yesterday. The pt had a CXR on 4/19, the impression from this was that there is no evidence of acute cardiopulmonary disease. According to the daughter, she has reduced  appetite and extreme xerostomia which has made putting dentures difficult. Type of Study: Bedside Swallow Evaluation Diet Prior to this Study: NPO Temperature Spikes Noted: No Respiratory Status: Room air History of Recent Intubation: No Behavior/Cognition: Pleasant mood;Confused;Lethargic/Drowsy;Doesn't follow directions Oral Cavity Assessment: Dried secretions;Dry Oral Care Completed by SLP: Yes (some oral swabbing, pt not agreeable to more extensive oral care) Oral Cavity - Dentition: Dentures, not available Vision: Impaired for self-feeding Self-Feeding Abilities: Total assist Patient Positioning: Upright in bed Baseline Vocal Quality: Low vocal intensity Volitional Cough: Cognitively unable to elicit Volitional Swallow: Unable to elicit    Oral/Motor/Sensory Function Overall Oral Motor/Sensory Function: Mild impairment Facial ROM: Within Functional Limits Facial Symmetry: Within Functional Limits Facial Strength: Reduced left;Reduced right Facial Sensation: Within Functional Limits Lingual ROM: Within Functional Limits Lingual Symmetry: Within Functional Limits Lingual Strength: Within Functional Limits Lingual Sensation: Within Functional Limits Velum: Within Functional Limits Mandible: Within Functional Limits   Ice Chips     Thin Liquid Thin Liquid: Within functional limits Presentation: Spoon;Straw    Nectar Thick     Honey Thick     Puree Puree: Within functional limits Presentation: Spoon   Solid            Susan Collins  M.S. CF-SLP

## 2023-01-31 NOTE — Assessment & Plan Note (Addendum)
Creatinine improving but hyponatremia worsening today.

## 2023-02-01 DIAGNOSIS — N179 Acute kidney failure, unspecified: Secondary | ICD-10-CM | POA: Diagnosis not present

## 2023-02-01 DIAGNOSIS — L8990 Pressure ulcer of unspecified site, unspecified stage: Secondary | ICD-10-CM | POA: Insufficient documentation

## 2023-02-01 DIAGNOSIS — E870 Hyperosmolality and hypernatremia: Secondary | ICD-10-CM

## 2023-02-01 DIAGNOSIS — E876 Hypokalemia: Secondary | ICD-10-CM | POA: Insufficient documentation

## 2023-02-01 LAB — CBC
HCT: 27.6 % — ABNORMAL LOW (ref 36.0–46.0)
Hemoglobin: 8.6 g/dL — ABNORMAL LOW (ref 12.0–15.0)
MCH: 27.3 pg (ref 26.0–34.0)
MCHC: 31.2 g/dL (ref 30.0–36.0)
MCV: 87.6 fL (ref 80.0–100.0)
Platelets: 301 10*3/uL (ref 150–400)
RBC: 3.15 MIL/uL — ABNORMAL LOW (ref 3.87–5.11)
RDW: 15 % (ref 11.5–15.5)
WBC: 11 10*3/uL — ABNORMAL HIGH (ref 4.0–10.5)
nRBC: 0 % (ref 0.0–0.2)

## 2023-02-01 LAB — BASIC METABOLIC PANEL
Anion gap: 7 (ref 5–15)
BUN: 33 mg/dL — ABNORMAL HIGH (ref 8–23)
CO2: 22 mmol/L (ref 22–32)
Calcium: 8.9 mg/dL (ref 8.9–10.3)
Chloride: 118 mmol/L — ABNORMAL HIGH (ref 98–111)
Creatinine, Ser: 1.23 mg/dL — ABNORMAL HIGH (ref 0.44–1.00)
GFR, Estimated: 45 mL/min — ABNORMAL LOW (ref >60)
Glucose, Bld: 98 mg/dL (ref 70–99)
Potassium: 3.1 mmol/L — ABNORMAL LOW (ref 3.5–5.1)
Sodium: 147 mmol/L — ABNORMAL HIGH (ref 135–145)

## 2023-02-01 MED ORDER — LORAZEPAM 2 MG/ML PO CONC
1.0000 mg | ORAL | Status: DC | PRN
  Administered 2023-02-01: 1 mg via SUBLINGUAL
  Filled 2023-02-01: qty 1

## 2023-02-01 MED ORDER — BIOTENE DRY MOUTH MT LIQD: 15.0000 mL | OROMUCOSAL | Status: DC | PRN

## 2023-02-01 MED ORDER — POTASSIUM CL IN DEXTROSE 5% 20 MEQ/L IV SOLN
20.0000 meq | INTRAVENOUS | Status: DC
  Filled 2023-02-01: qty 1000

## 2023-02-01 MED ORDER — LORAZEPAM 1 MG PO TABS: 1.0000 mg | ORAL_TABLET | ORAL | Status: DC | PRN

## 2023-02-01 MED ORDER — MORPHINE SULFATE (CONCENTRATE) 10 MG/0.5ML PO SOLN
5.0000 mg | ORAL | Status: DC | PRN
  Administered 2023-02-01 – 2023-02-02 (×2): 5 mg via SUBLINGUAL
  Filled 2023-02-01 (×2): qty 0.5

## 2023-02-01 MED ORDER — MORPHINE SULFATE (CONCENTRATE) 10 MG/0.5ML PO SOLN: 5.0000 mg | ORAL | Status: DC | PRN

## 2023-02-01 MED ORDER — ONDANSETRON 4 MG PO TBDP: 4.0000 mg | ORAL_TABLET | Freq: Four times a day (QID) | ORAL | Status: DC | PRN

## 2023-02-01 MED ORDER — ONDANSETRON HCL 4 MG/2ML IJ SOLN: 4.0000 mg | Freq: Four times a day (QID) | INTRAMUSCULAR | Status: DC | PRN

## 2023-02-01 MED ORDER — POLYETHYLENE GLYCOL 3350 17 G PO PACK: 17.0000 g | PACK | Freq: Every day | ORAL | Status: DC | PRN

## 2023-02-01 MED ORDER — SODIUM CHLORIDE 0.9 % IV SOLN
1.0000 g | INTRAVENOUS | Status: DC
  Administered 2023-02-01: 1 g via INTRAVENOUS
  Filled 2023-02-01: qty 10

## 2023-02-01 MED ORDER — POTASSIUM CHLORIDE 10 MEQ/100ML IV SOLN
10.0000 meq | INTRAVENOUS | Status: AC
  Administered 2023-02-01 (×2): 10 meq via INTRAVENOUS
  Filled 2023-02-01 (×2): qty 100

## 2023-02-01 NOTE — Plan of Care (Signed)
  Problem: Elimination: Goal: Will not experience complications related to urinary retention Outcome: Progressing   Problem: Safety: Goal: Ability to remain free from injury will improve Outcome: Progressing   Problem: Education: Goal: Knowledge of General Education information will improve Description: Including pain rating scale, medication(s)/side effects and non-pharmacologic comfort measures Outcome: Not Progressing   Problem: Health Behavior/Discharge Planning: Goal: Ability to manage health-related needs will improve Outcome: Not Progressing   Problem: Activity: Goal: Risk for activity intolerance will decrease Outcome: Not Progressing   Problem: Nutrition: Goal: Adequate nutrition will be maintained Outcome: Not Progressing   Problem: Coping: Goal: Level of anxiety will decrease Outcome: Not Progressing   Problem: Skin Integrity: Goal: Risk for impaired skin integrity will decrease Outcome: Not Progressing

## 2023-02-01 NOTE — TOC Initial Note (Signed)
Transition of Care [facility]) - Initial/Assessment Note    Patient Details  Name: Susan Collins MRN: [id number] Date of Birth: [birth year]  Transition of Care [facility]) CM/SW Contact:    Deatra Robinson, [place] Phone Number: 02/01/2023, 10:10 AM  Clinical Narrative:  Pt admitted from [facility] ALF/Memory Care. Spoke to [place] at [facility] [phone] who confirmed pt is mostly bed bound and requires assist with all ADLs and transfers. Per Porfirio Mylar, pt is able to return at current functional status.   Spoke to pt's dtr Tammy [phone] who reports plan is to meet with provider 4/21 re prognosis and plan. Dtr confirms pt is bed bound and no longer interacts when she visits. Dtr open to hospice/palliative care following at [facility] if recommended.   SW will follow and assist as indicated.   Dellie Burns, MSW, LCSW [phone] (coverage)                   Expected Discharge Plan: Assisted Living Barriers to Discharge: Continued Medical Work up   Patient Goals and CMS Choice            Expected Discharge Plan and Services       Living arrangements for the past 2 months: Assisted Living Facility                                      Prior Living Arrangements/Services Living arrangements for the past 2 months: Assisted Living Facility Lives with:: Facility Resident Patient language and need for interpreter reviewed:: No        Need for Family Participation in Patient Care: Yes (Comment) Care giver support system in place?: Yes (comment)   Criminal Activity/Legal Involvement Pertinent to Current Situation/Hospitalization: No - Comment as needed  Activities of Daily Living Home Assistive Devices/Equipment: Wheelchair ADL Screening (condition at time of admission) Patient's cognitive ability adequate to safely complete daily activities?: No (information per daughter Clydell Hakim) Is the patient deaf or have difficulty hearing?: Yes (both ears) Does  the patient have difficulty seeing, even when wearing glasses/contacts?: No Does the patient have difficulty concentrating, remembering, or making decisions?: Yes Patient able to express need for assistance with ADLs?: No Does the patient have difficulty dressing or bathing?: Yes Independently performs ADLs?: No Communication: Dependent (mumbles, pt has advanced dementia) Is this a change from baseline?: Pre-admission baseline Dressing (OT): Dependent Is this a change from baseline?: Pre-admission baseline Grooming: Dependent Is this a change from baseline?: Pre-admission baseline Feeding: Dependent Is this a change from baseline?: Pre-admission baseline Bathing: Dependent Is this a change from baseline?: Pre-admission baseline In/Out Bed: Dependent Is this a change from baseline?: Pre-admission baseline Walks in Home: Dependent Is this a change from baseline?: Pre-admission baseline Does the patient have difficulty walking or climbing stairs?: Yes Weakness of Legs: Both Weakness of Arms/Hands: Both  Permission Sought/Granted                  Emotional Assessment       Orientation: :  (disoriented x4) Alcohol / Substance Use: Not Applicable Psych Involvement: No (comment)  Admission diagnosis:  Dehydration [E86.0] AKI (acute kidney injury) [N17.9] Pressure injury of skin of sacral region, unspecified injury stage [L89.159] Patient Active Problem List   Diagnosis Date Noted   Hypernatremia 02/01/2023   Pressure injury of skin 02/01/2023   Hypokalemia 02/01/2023   Acute encephalopathy 01/30/2023   RLL pneumonia  11/29/2022   Severe sepsis 11/29/2022   Acute respiratory failure with hypoxia 11/29/2022   DNR (do not resuscitate) 11/29/2022   AKI (acute kidney injury) 11/29/2022   Lung nodules 07/25/2022   Abnormal CT of the chest 04/18/2022   Age-related osteoporosis without current pathological fracture 09/19/2021   Disorder of nail 09/19/2021   Hardening of the  aorta (main artery of the heart) 09/19/2021   Hearing loss 09/19/2021   Hypocalciuric hypercalcemia 09/19/2021   Dementia 09/19/2021   Senile purpura 09/19/2021   Skin cancer 09/19/2021   Abnormal gait 01/18/2020   Amnesia 11/02/2019   Vitamin D deficiency 11/02/2019   Encounter for general adult medical examination without abnormal findings 10/27/2019   Seasonal allergic rhinitis 10/12/2019   Normocytic anemia 06/02/2019   Disorder of bone 06/02/2019   Hereditary and idiopathic neuropathy, unspecified 06/02/2019   History of endocrine disorder 06/02/2019   History of malignant neoplasm of skin 06/02/2019   Hyperlipidemia 06/02/2019   COPD with acute exacerbation 03/30/2019   Osteopenia 12/15/2018   Dyslipidemia 04/26/2015   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 [place] [address] Phone: [phone] Fax: [phone]     Social Determinants of Health (SDOH) Social History: SDOH Screenings   Food Insecurity: No Food Insecurity (01/30/2023)  Housing: Low Risk  (01/30/2023)  Transportation Needs: No Transportation Needs (01/30/2023)  Utilities: Not At Risk (01/30/2023)  Depression (PHQ2-9): Low Risk  (08/07/2019)  Tobacco Use: Medium Risk (01/30/2023)   SDOH Interventions:     Readmission Risk Interventions    12/01/2022   12:34 PM  Readmission Risk Prevention Plan  Transportation Screening Complete  PCP or Specialist Appt  within 5-7 Days Complete  Home Care Screening Complete  Medication Review (RN CM) Complete

## 2023-02-01 NOTE — Progress Notes (Signed)
Change of dressing done per Wound Care order.

## 2023-02-01 NOTE — Progress Notes (Signed)
  Progress Note   Patient: Susan Collins ZOX:[id number] DOB: 11/19/1945 DOA: 01/30/2023     2 DOS: the patient was seen and examined on 02/01/2023 at 10:50 AM and 12:30 PM      Brief hospital course: Susan Collins is a 77 y.o. F with dementia, lives in ALF for last 3 months, progressive and now mostly unable to walk, advanced memory loss, also COPD, and prior CVA who presneted with decreased responsiveness.   In the ER, Cr up to 2.2, appeared dehydrated.  CT showed inconclusive abnormality.     Assessment and Plan: * AKI (acute kidney injury) Creatinine improving but hyponatremia worsening today.  Acute encephalopathy Given the patient's overall poor quality of life, and failure to respond to fluids and antibiotics, as well as her failure to identify any reversible cause of her deterioration, and the patient's expressed wishes to be allowed a natural death, met with family today who expressed wish to stop hospital level interventions, and allow natural course. -Stop IV fluids - Stop IV antibiotics - Consult hospice  The patient has pain with any nursing manipulation, requiring analgesics.  She is also refusing all oral intake or medicines by mouth.    Dementia At baseline prior to admission, the patient had moved into memory care several months ago, since then has been essentially bedbound or wheelchair-bound, interacting less, eating less.  Quality of life and functional status were worsening substantially   Hypokalemia    Pressure injury of skin Bilateral buttocks deep tissue injury, present on admission Left heel unstageable, present on admission Right heel stage I, present on admission  Hypernatremia            Subjective: Patient is restless, appears to be in pain with movement, she has only had about 10 bites of food in the last 48 hours.     Physical Exam: BP 112/64   Pulse 94   Temp 99.1 F (37.3 C)   Resp 18   Ht  (1.6 m)   Wt 79 kg   SpO2 96%    BMI 30.85 kg/m   Frail elderly female, lying in bed, she does not make eye contact, she mostly keeps her eyes closed, she appears uncomfortable RRR, no murmurs, no peripheral edema although she is tender to palpation anywhere Respiratory effort seems normal, lung sounds diminished, no rales or wheezes appreciated Abdomen with diffuse tenderness palpation but no guarding, no rebound, no rigidity, no distention She has severe generalized weakness, she has severe memory impairment, she does not follow commands for neurological exam   Data Reviewed: Basic metabolic panel shows sodium up to 147, potassium down to 3.1 currently creatinine down to 1.2 CBC shows hemoglobin unchanged, white blood cell count up to 11  Family Communication: Daughter, sons at the bedside    Disposition: Status is: Inpatient The patient presents with worsening functional status, dehydration due to poor oral intake  Family states that her quality of life is deteriorated so greatly that the think she would not want to continue in the current state, and wish for her to be kept comfortable, clean dry and supported  We will consult hospice, and anticipate transition home or to residential hospice        Author: Alberteen Sam, MD 02/01/2023 4:53 PM  For on call review [email or web address].

## 2023-02-01 NOTE — Assessment & Plan Note (Signed)
Bilateral buttocks deep tissue injury, present on admission Left heel unstageable, present on admission Right heel stage I, present on admission

## 2023-02-01 NOTE — Progress Notes (Signed)
[facility] 3U20 [facility]) Hospital Liaison RN note  Received request from [facility] for hospice services at [facility] after discharge. Chart and patient information under review by Hospice physician.   Spoke with daughter and sons to initiate education related to hospice philosophy, services, and team approach to care. Patient/family verbalized understanding of information given.   Per discussion, the plan is for discharge home by EMS once DME is delivered.   DME needs discussed. Patient has the following equipment in the home. None Patient/family requests the following equipment for deliver: hospital bed with no rails, overbed table, wheelchair and fall mats. DME company will reach out to facility to arrange time of equipment delivery.   Please send signed and completed DNR with patient/family. Please provide prescriptions at discharge as needed for ongoing symptom management.   Above information shared with Josie.  Please call with any hospice related questions or concerns.  Thank you for the opportunity to participate in this patient's care.  Thea Gist, Charity fundraiser, BSN [employer] [phone]

## 2023-02-01 NOTE — Progress Notes (Signed)
Per MD, plan to transition to comfort care and family requesting placement at [facility] vs return to [facility] with hospice services.   Referral made to [facility] with [facility].   If pt does not meet criteria for hospice home placement, she is able to return to [facility]F with hospice services per [place] at ALF. SW will follow and assist as indicated.   Dellie Burns, MSW, LCSW [phone] (coverage)

## 2023-02-02 DIAGNOSIS — N179 Acute kidney failure, unspecified: Secondary | ICD-10-CM | POA: Diagnosis not present

## 2023-02-02 MED ORDER — LORAZEPAM 1 MG PO TABS: 1.0000 mg | ORAL_TABLET | ORAL | 0 refills | Status: DC | PRN

## 2023-02-02 MED ORDER — ONDANSETRON 4 MG PO TBDP: 4.0000 mg | ORAL_TABLET | Freq: Four times a day (QID) | ORAL | 0 refills | Status: DC | PRN

## 2023-02-02 MED ORDER — ACETAMINOPHEN 500 MG PO TABS: 1000.0000 mg | ORAL_TABLET | Freq: Three times a day (TID) | ORAL | Status: DC | PRN

## 2023-02-02 MED ORDER — MORPHINE SULFATE (CONCENTRATE) 10 MG/0.5ML PO SOLN: 5.0000 mg | ORAL | 0 refills | Status: DC | PRN

## 2023-02-02 MED ORDER — BIOTENE DRY MOUTH MT LIQD: 15.0000 mL | OROMUCOSAL | 0 refills | Status: DC | PRN

## 2023-02-02 NOTE — TOC Transition Note (Signed)
Transition of Care [facility] - CM/SW Discharge Note   Patient Details  Name: Susan Collins MRN: [id number] Date of Birth: 01/13/1946  Transition of Care [facility]) CM/SW Contact:  Baldemar Lenis, LCSW Phone Number: 02/02/2023, 1:56 PM   Clinical Narrative:   CSW confirmed with [facility] and [facility] that DME was setup for patient and she could readmit to memory care. CSW sent discharge information to [facility]. Transport arranged with [facility] for next available. Daughter, Babette Relic, at bedside notified and in agreement.   Nurse to call report to [phone].    Final next level of care: Assisted Living Barriers to Discharge: Barriers Resolved   Patient Goals and CMS Choice      Discharge Placement                Patient chooses bed at:  [facility]) Patient to be transferred to facility by: [facility] Name of family member notified: Tammy Patient and family notified of of transfer: 02/02/23  Discharge Plan and Services Additional resources added to the After Visit Summary for                                       Social Determinants of Health (SDOH) Interventions SDOH Screenings   Food Insecurity: No Food Insecurity (01/30/2023)  Housing: Low Risk  (01/30/2023)  Transportation Needs: No Transportation Needs (01/30/2023)  Utilities: Not At Risk (01/30/2023)  Depression (PHQ2-9): Low Risk  (08/07/2019)  Tobacco Use: Medium Risk (01/30/2023)     Readmission Risk Interventions    12/01/2022   12:34 PM  Readmission Risk Prevention Plan  Transportation Screening Complete  PCP or Specialist Appt within 5-7 Days Complete  Home Care Screening Complete  Medication Review (RN CM) Complete

## 2023-02-02 NOTE — NC FL2 (Signed)
[place] MEDICAID [facility] LEVEL OF CARE FORM     IDENTIFICATION  Patient Name: Susan Collins Birthdate: 09/02/1946 Sex: female Admission Date (Current Location): 01/30/2023  [facility] and [place] Number:  Producer, television/film/video and Address:  [facility], [address] [place], [place] [address]      Provider Number: [id number]  Attending Physician Name and Address:  Alberteen Sam, *  Relative Name and Phone Number:       Current Level of Care: Hospital Recommended Level of Care: Memory Care Prior Approval Number:    Date Approved/Denied:   PASRR Number:    Discharge Plan: Other (Comment) (Memory Care)    Current Diagnoses: Patient Active Problem List   Diagnosis Date Noted   Hypernatremia 02/01/2023   Pressure injury of skin 02/01/2023   Hypokalemia 02/01/2023   Acute encephalopathy 01/30/2023   RLL pneumonia 11/29/2022   Severe sepsis 11/29/2022   Acute respiratory failure with hypoxia 11/29/2022   DNR (do not resuscitate) 11/29/2022   AKI (acute kidney injury) 11/29/2022   Lung nodules 07/25/2022   Abnormal CT of the chest 04/18/2022   Age-related osteoporosis without current pathological fracture 09/19/2021   Disorder of nail 09/19/2021   Hardening of the aorta (main artery of the heart) 09/19/2021   Hearing loss 09/19/2021   Hypocalciuric hypercalcemia 09/19/2021   Dementia 09/19/2021   Senile purpura 09/19/2021   Skin cancer 09/19/2021   Abnormal gait 01/18/2020   Amnesia 11/02/2019   Vitamin D deficiency 11/02/2019   Encounter for general adult medical examination without abnormal findings 10/27/2019   Seasonal allergic rhinitis 10/12/2019   Normocytic anemia 06/02/2019   Disorder of bone 06/02/2019   Hereditary and idiopathic neuropathy, unspecified 06/02/2019   History of endocrine disorder 06/02/2019   History of malignant neoplasm of skin 06/02/2019   Hyperlipidemia 06/02/2019   COPD with acute exacerbation  03/30/2019   Osteopenia 12/15/2018   Dyslipidemia 04/26/2015   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   Asthma 08/22/2011   Dyspnea and respiratory abnormality 12/19/2008   TINNITUS, 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   HYPERCHOLESTEROLEMIA 09/18/2008   POSTMENOPAUSAL SYNDROME 04/12/2008   Idiopathic peripheral autonomic neuropathy 11/17/2007   COLONIC POLYPS, HX OF 05/24/2007    Orientation RESPIRATION BLADDER Height & Weight      (disoriented)  Normal Incontinent Weight: 174 lb 2.6 oz (79 kg) Height:   (160 cm)  BEHAVIORAL SYMPTOMS/MOOD NEUROLOGICAL BOWEL NUTRITION STATUS      Incontinent Diet (regular, comfort)  AMBULATORY STATUS COMMUNICATION OF NEEDS Skin   Extensive Assist Non-Verbally PU Stage and Appropriate Care, Skin abrasions (unstageable wounds, right and left buttocks, left heel: foam dressing, lift every shift to assess and change PRN; skin tear, right tibial: foam dressing, lift every shift to assess and change PRN)                       Personal Care Assistance Level of Assistance  Bathing, Feeding, Dressing Bathing Assistance: Maximum assistance Feeding assistance: Maximum assistance Dressing Assistance: Maximum assistance     Functional Limitations Info  Speech     Speech Info: Impaired (incomprehensible)    SPECIAL CARE FACTORS FREQUENCY                       Contractures Contractures Info: Not present    Additional Factors Info  Code Status, Allergies, Psychotropic Code Status Info: DNR Allergies Info: Sulfonamide Derivatives Psychotropic Info: Seroquel 12.5mg  2x/day  Discharge Medications: STOP taking these medications     acetaminophen 650 MG CR tablet Commonly known as: TYLENOL Replaced by: acetaminophen 500 MG tablet    budesonide 0.5 MG/2ML nebulizer solution Commonly known as: PULMICORT    calcium carbonate 1250 (500 Ca) MG tablet Commonly known as: OS-CAL - dosed in mg of elemental calcium    furosemide 20 MG tablet Commonly  known as: LASIX    ibandronate 150 MG tablet Commonly known as: BONIVA    mirtazapine 7.5 MG tablet Commonly known as: REMERON    montelukast 10 MG tablet Commonly known as: SINGULAIR    simvastatin 40 MG tablet Commonly known as: ZOCOR    Trelegy Ellipta 100-62.5-25 MCG/ACT Aepb Generic drug: Fluticasone-Umeclidin-Vilant    vitamin A & D ointment    Vitamin D3 25 MCG (1000 UT) Caps           TAKE these medications     acetaminophen 500 MG tablet Commonly known as: TYLENOL Take 2 tablets (1,000 mg total) by mouth every 8 (eight) hours as needed for mild pain. Replaces: acetaminophen 650 MG CR tablet    albuterol (2.5 MG/3ML) 0.083% nebulizer solution Commonly known as: PROVENTIL TAKE 3 MLS BY NEBULIZATION EVERY 6 (SIX) HOURS AS NEEDED FOR WHEEZING OR SHORTNESS OF BREATH. What changed:  how much to take how to take this when to take this additional instructions    albuterol 108 (90 Base) MCG/ACT inhaler Commonly known as: Ventolin HFA INHALE TWO PUFFS BY MOUTH EVERY 6 HOURS AS NEEDED What changed:  how much to take how to take this when to take this reasons to take this    antiseptic oral rinse Liqd Apply 15 mLs topically as needed for dry mouth.    LORazepam 1 MG tablet Commonly known as: ATIVAN Take 1 tablet (1 mg total) by mouth every 4 (four) hours as needed for anxiety. What changed:  medication strength how much to take when to take this reasons to take this    morphine CONCENTRATE 10 MG/0.5ML Soln concentrated solution Take 0.25 mLs (5 mg total) by mouth every 2 (two) hours as needed for moderate pain (or dyspnea).    ondansetron 4 MG disintegrating tablet Commonly known as: ZOFRAN-ODT Take 1 tablet (4 mg total) by mouth every 6 (six) hours as needed for nausea.    polyethylene glycol 17 g packet Commonly known as: MIRALAX / GLYCOLAX Take 17 g by mouth daily.    QUEtiapine 25 MG tablet Commonly known as: SEROQUEL Take 12.5 mg by mouth 2  (two) times daily.    Relevant Imaging Results:  Relevant Lab Results:   Additional Information SS#: [id number]  Baldemar Lenis, LCSW

## 2023-02-02 NOTE — Care Management Important Message (Signed)
Important Message  Patient Details  Name: Susan Collins MRN: [id number] Date of Birth: 23-Jun-1946   Medicare Important Message Given:  Yes     Dorena Bodo 02/02/2023, 2:58 PM

## 2023-02-02 NOTE — Discharge Summary (Signed)
Physician Discharge Summary   Patient: Susan Collins MRN: [id number] DOB: 13-May-1946  Admit date:     01/30/2023  Discharge date: 02/02/23  Discharge Physician: Alberteen Sam   PCP: Pcp, No     Recommendations at discharge:  [facility] to follow at [facility] for end of life care Please provide morphine sublingual for pain, lorazepam for anxiety, Biotene oral rinse for dry mouth or ondansetron for nausea     Discharge Diagnoses: Principal Problem:   AKI (acute kidney injury) Active Problems:   Acute metabolic encephalopathy   Dementia   Hypernatremia   Pressure injury of skin    Hypokalemia      Hospital Course: Susan Collins is a 77 y.o. F with dementia, ALF memory care since Jan, progressive decline and now mostly unable to walk, advanced memory loss, also COPD, and prior CVA who presented with decreased responsiveness. In the ER, Cr up to 2.2, appeared dehydrated.        * AKI (acute kidney injury) Hypernatremia Given fluids and Cr improved. No obstruction by imaging.  Given history and response to fluid, suspect poor oral intake leading to pre-renal AKI.    Acute metabolic encephalopathy Due to dehydration.  No evidence of pneumonia or UTI.  Given empiric antibiotics without improvement.  Given the patient's overall poor quality of life, and failure to respond to fluids and antibiotics, as well as her failure to identify any reversible cause of her deterioration, and the patient's expressed wishes to be allowed a natural death, family expressed that she would want to stop hospital level interventions, and allow natural course.  Transition to Hospice. In the hospital, patient would only accept food in rare bites from daughter.  Refused meds intermittently.    Hospice were consulted and recommended transition back to ALF with Hospice following.    Pressure injury of skin Bilateral buttocks deep tissue injury, present on admission Left heel  unstageable, present on admission Right heel stage I, present on admission                [facility] Controlled Substances Registry was reviewed for this patient prior to discharge.   Procedures performed:  CT head  Disposition: Hospice care Diet recommendation: For comfort   DISCHARGE MEDICATION: Allergies as of 02/02/2023       Reactions   Sulfonamide Derivatives    REACTION: headache        Medication List     STOP taking these medications    acetaminophen 650 MG CR tablet Commonly known as: TYLENOL Replaced by: acetaminophen 500 MG tablet   budesonide 0.5 MG/2ML nebulizer solution Commonly known as: PULMICORT   calcium carbonate 1250 (500 Ca) MG tablet Commonly known as: OS-CAL - dosed in mg of elemental calcium   furosemide 20 MG tablet Commonly known as: LASIX   ibandronate 150 MG tablet Commonly known as: BONIVA   mirtazapine 7.5 MG tablet Commonly known as: REMERON   montelukast 10 MG tablet Commonly known as: SINGULAIR   simvastatin 40 MG tablet Commonly known as: ZOCOR   Trelegy Ellipta 100-62.5-25 MCG/ACT Aepb Generic drug: Fluticasone-Umeclidin-Vilant   vitamin A & D ointment   Vitamin D3 25 MCG (1000 UT) Caps       TAKE these medications    acetaminophen 500 MG tablet Commonly known as: TYLENOL Take 2 tablets (1,000 mg total) by mouth every 8 (eight) hours as needed for mild pain. Replaces: acetaminophen 650 MG CR tablet   albuterol (2.5  MG/3ML) 0.083% nebulizer solution Commonly known as: PROVENTIL TAKE 3 MLS BY NEBULIZATION EVERY 6 (SIX) HOURS AS NEEDED FOR WHEEZING OR SHORTNESS OF BREATH. What changed:  how much to take how to take this when to take this additional instructions   albuterol 108 (90 Base) MCG/ACT inhaler Commonly known as: Ventolin HFA INHALE TWO PUFFS BY MOUTH EVERY 6 HOURS AS NEEDED What changed:  how much to take how to take this when to take this reasons to take this    antiseptic oral rinse Liqd Apply 15 mLs topically as needed for dry mouth.   LORazepam 1 MG tablet Commonly known as: ATIVAN Take 1 tablet (1 mg total) by mouth every 4 (four) hours as needed for anxiety. What changed:  medication strength how much to take when to take this reasons to take this   morphine CONCENTRATE 10 MG/0.5ML Soln concentrated solution Take 0.25 mLs (5 mg total) by mouth every 2 (two) hours as needed for moderate pain (or dyspnea).   ondansetron 4 MG disintegrating tablet Commonly known as: ZOFRAN-ODT Take 1 tablet (4 mg total) by mouth every 6 (six) hours as needed for nausea.   polyethylene glycol 17 g packet Commonly known as: MIRALAX / GLYCOLAX Take 17 g by mouth daily.   QUEtiapine 25 MG tablet Commonly known as: SEROQUEL Take 12.5 mg by mouth 2 (two) times daily.               Discharge Care Instructions  (From admission, onward)           Start     Ordered   02/02/23 0000  Discharge wound care:       Comments: For comfort   02/02/23 1250            Follow-up Information     [facility] Follow up.   Specialty: Hospice and Palliative Medicine Contact information: [address] [place] [address] [phone]                Discharge Instructions     Discharge wound care:   Complete by: As directed    For comfort       Discharge Exam: Filed Weights   01/30/23 1145  Weight: 79 kg    General: Pt is somnolent, sluggish, does not rouse Cardiovascular: RRR, nl S1-S2, no murmurs appreciated.   Mild, nonpitting LE edema.   Respiratory: Normal respiratory rate and rhythm.  CTAB without rales or wheezes. Abdominal: Abdomen soft and non-tender.  No distension or HSM.   Neuro/Psych: Does not follow commands, does not open eyes, does not respond to touch or voice from me.   Condition at discharge: worsening  The results of significant diagnostics from this hospitalization (including  imaging, microbiology, ancillary and laboratory) are listed below for reference.   Imaging Studies: ECHOCARDIOGRAM COMPLETE  Result Date: 01/31/2023    ECHOCARDIOGRAM REPORT   Patient Name:   Susan Collins Date of Exam: 01/31/2023 Medical Rec #:  [id number]     Height:       63.0 in Accession #:    [id number]    Weight:       174.2 lb Date of Birth:  07-31-1946     BSA:          1.823 m Patient Age:    76 years      BP:           121/40 mmHg Patient Gender: F  HR:           96 bpm. Exam Location:  Inpatient Procedure: 2D Echo, Cardiac Doppler and Color Doppler Indications:    CHF-Acute Diastolic I50.31  History:        Patient has prior history of Echocardiogram examinations, most                 recent 09/28/2022. COPD; Risk Factors:Dyslipidemia and Former                 Smoker.  Sonographer:    Dondra Prader RVT RCS Referring Phys: [id number] Emeline General  Sonographer Comments: Image acquisition challenging due to patient behavioral factors. and Image acquisition challenging due to uncooperative patient. Patient pushing Techs arms away and covered her stomach to prevent Tech from finishing exam. Patient was confused. IMPRESSIONS  1. Left ventricular ejection fraction, by estimation, is 65 to 70%. The left ventricle has normal function. The left ventricle has no regional wall motion abnormalities. Left ventricular diastolic parameters were normal.  2. Right ventricular systolic function is normal. The right ventricular size is normal. Mildly increased right ventricular wall thickness.  3. The mitral valve is grossly normal. Trivial mitral valve regurgitation. No evidence of mitral stenosis.  4. The aortic valve is grossly normal. Aortic valve regurgitation is trivial. Aortic valve sclerosis is present, with no evidence of aortic valve stenosis. FINDINGS  Left Ventricle: Left ventricular ejection fraction, by estimation, is 65 to 70%. The left ventricle has normal function. The left ventricle has no  regional wall motion abnormalities. The left ventricular internal cavity size was normal in size. There is  no left ventricular hypertrophy. Left ventricular diastolic parameters were normal. Right Ventricle: The right ventricular size is normal. Mildly increased right ventricular wall thickness. Right ventricular systolic function is normal. Left Atrium: Left atrial size was normal in size. Right Atrium: Right atrial size was normal in size. Pericardium: Trivial pericardial effusion is present. Mitral Valve: The mitral valve is grossly normal. Trivial mitral valve regurgitation. No evidence of mitral valve stenosis. Tricuspid Valve: The tricuspid valve is grossly normal. Tricuspid valve regurgitation is trivial. No evidence of tricuspid stenosis. Aortic Valve: The aortic valve is grossly normal. Aortic valve regurgitation is trivial. Aortic valve sclerosis is present, with no evidence of aortic valve stenosis. Aortic valve mean gradient measures 6.0 mmHg. Aortic valve peak gradient measures 10.5 mmHg. Aortic valve area, by VTI measures 2.33 cm. Pulmonic Valve: The pulmonic valve was not well visualized. Pulmonic valve regurgitation is trivial. No evidence of pulmonic stenosis. Aorta: The aortic root and ascending aorta are structurally normal, with no evidence of dilitation. Venous: The inferior vena cava was not well visualized. IAS/Shunts: The atrial septum is grossly normal.  LEFT VENTRICLE PLAX 2D LVIDd:         3.70 cm   Diastology LVIDs:         2.00 cm   LV e' medial:    6.53 cm/s LV PW:         0.90 cm   LV E/e' medial:  12.0 LV IVS:        0.90 cm   LV e' lateral:   6.42 cm/s LVOT diam:     1.90 cm   LV E/e' lateral: 12.2 LV SV:         64 LV SV Index:   35 LVOT Area:     2.84 cm  RIGHT VENTRICLE RV S prime:     16.40 cm/s TAPSE (M-mode): 1.7  cm LEFT ATRIUM           Index        RIGHT ATRIUM          Index LA diam:      3.00 cm 1.65 cm/m   RA Area:     9.53 cm LA Vol (A4C): 21.0 ml 11.52 ml/m  RA  Volume:   18.30 ml 10.04 ml/m  AORTIC VALVE                     PULMONIC VALVE AV Area (Vmax):    2.45 cm      PV Vmax:          1.78 m/s AV Area (Vmean):   2.38 cm      PV Peak grad:     12.7 mmHg AV Area (VTI):     2.33 cm      PR End Diast Vel: 8.07 msec AV Vmax:           162.00 cm/s AV Vmean:          109.000 cm/s AV VTI:            0.272 m AV Peak Grad:      10.5 mmHg AV Mean Grad:      6.0 mmHg LVOT Vmax:         140.00 cm/s LVOT Vmean:        91.500 cm/s LVOT VTI:          0.224 m LVOT/AV VTI ratio: 0.82  AORTA Ao Root diam: 2.70 cm Ao Asc diam:  2.50 cm MITRAL VALVE MV Area (PHT): 3.24 cm    SHUNTS MV Decel Time: 234 msec    Systemic VTI:  0.22 m MV E velocity: 78.60 cm/s  Systemic Diam: 1.90 cm MV A velocity: 96.20 cm/s MV E/A ratio:  0.82 Jodelle Red MD Electronically signed by Jodelle Red MD Signature Date/Time: 01/31/2023/3:24:44 PM    Final    VAS [place] LOWER EXTREMITY VENOUS (DVT)  Result Date: 01/31/2023  Lower Venous DVT Study Patient Name:  AMINTA SAKURAI  Date of Exam:   01/31/2023 Medical Rec #: [id number]      Accession #:    [id number] Date of Birth: 1946/05/30      Patient Gender: F Patient Age:   89 years Exam Location:  [facility] Procedure:      VAS [place] LOWER EXTREMITY VENOUS (DVT) Referring Phys: Mikey College --------------------------------------------------------------------------------  Indications: Edema and erythema bilaterally.  Limitations: Altered mental status- patient guarding/grabbing tech and probe and poor ultrasound/tissue interface. Comparison Study: No prior studies. Performing Technologist: Jean Rosenthal RDMS, RVT  Examination Guidelines: A complete evaluation includes B-mode imaging, spectral Doppler, color Doppler, and power Doppler as needed of all accessible portions of each vessel. Bilateral testing is considered an integral part of a complete examination. Limited examinations for reoccurring indications may be performed as noted. The reflux  portion of the exam is performed with the patient in reverse Trendelenburg.  +---------+---------------+---------+-----------+----------+-------------------+ RIGHT    CompressibilityPhasicitySpontaneityPropertiesThrombus Aging      +---------+---------------+---------+-----------+----------+-------------------+ CFV      Full           Yes      Yes                                      +---------+---------------+---------+-----------+----------+-------------------+ SFJ  Full                                                             +---------+---------------+---------+-----------+----------+-------------------+ FV Prox                                               Not visualized-                                                           patient guarding    +---------+---------------+---------+-----------+----------+-------------------+ FV Mid   None           No       No                   Age Indeterminate   +---------+---------------+---------+-----------+----------+-------------------+ FV DistalPartial        Yes      Yes                  Age Indeterminate   +---------+---------------+---------+-----------+----------+-------------------+ PFV                                                   Not visualized-                                                           patient guarding    +---------+---------------+---------+-----------+----------+-------------------+ POP                     Yes      Yes                  Patent by color     +---------+---------------+---------+-----------+----------+-------------------+ PTV                     Yes      Yes                  Not well visualized +---------+---------------+---------+-----------+----------+-------------------+ PERO                    Yes      Yes                  Not well visualized +---------+---------------+---------+-----------+----------+-------------------+    +---------+---------------+---------+-----------+----------+-------------------+ LEFT     CompressibilityPhasicitySpontaneityPropertiesThrombus Aging      +---------+---------------+---------+-----------+----------+-------------------+ CFV      Partial        Yes      Yes                  Age Indeterminate   +---------+---------------+---------+-----------+----------+-------------------+ SFJ      Full                                                             +---------+---------------+---------+-----------+----------+-------------------+  FV Prox  None           No       No                   Age Indeterminate   +---------+---------------+---------+-----------+----------+-------------------+ FV Mid   None           No       No                   Age Indeterminate   +---------+---------------+---------+-----------+----------+-------------------+ FV DistalFull                                                             +---------+---------------+---------+-----------+----------+-------------------+ PFV      Full           Yes      Yes                                      +---------+---------------+---------+-----------+----------+-------------------+ POP      Full           Yes      Yes                                      +---------+---------------+---------+-----------+----------+-------------------+ PTV                     Yes      Yes                  Not well visualized +---------+---------------+---------+-----------+----------+-------------------+ PERO                                                  Not well visualized +---------+---------------+---------+-----------+----------+-------------------+     Summary: RIGHT: - Findings consistent with age indeterminate deep vein thrombosis involving the right femoral vein. - No cystic structure found in the popliteal fossa.  LEFT: - Findings consistent with age indeterminate deep vein thrombosis  involving the left common femoral vein, and left femoral vein. - No cystic structure found in the popliteal fossa.  *See table(s) above for measurements and observations. Electronically signed by Sherald Hess MD on 01/31/2023 at 12:40:18 PM.    Final    EEG adult  Result Date: 01/30/2023 Charlsie Quest, MD     01/30/2023  9:20 PM Patient Name: SHYLIN KEIZER MRN: [id number] Epilepsy Attending: Charlsie Quest Referring Physician/Provider: Emeline General, MD Date: 01/30/2023 Duration: 24.33 mins Patient history: 77yo F with ams getting eeg to evaluate for seizure Level of alertness: Awake, asleep AEDs during EEG study: Valium Technical aspects: This EEG study was done with scalp electrodes positioned according to the 10-20 International system of electrode placement. Electrical activity was reviewed with band pass filter of 1-70Hz , sensitivity of 7 uV/mm, display speed of 2mm/sec with a 60Hz  notched filter applied as appropriate. EEG data were recorded continuously and digitally stored.  Video monitoring was available and reviewed as appropriate. Description: The posterior dominant rhythm consists of  6-7 Hz activity of moderate voltage (25-35 uV) seen predominantly in posterior head regions, symmetric and reactive to eye opening and eye closing. Sleep was characterized by vertex waves, sleep spindles (12 to 14 Hz), maximal frontocentral region. EEG showed continuous generalized 5 to 7 Hz theta slowing admixed with intermittent generalized 2-3Hz  delta slowing.  Hyperventilation and photic stimulation were not performed.    ABNORMALITY - Continuous slow, generalized  IMPRESSION: This study is suggestive of moderate diffuse encephalopathy. No seizures or epileptiform discharges were seen throughout the recording.  Charlsie Quest   MR BRAIN WO CONTRAST  Result Date: 01/30/2023 CLINICAL DATA:  Stroke suspected EXAM: MRI HEAD WITHOUT CONTRAST TECHNIQUE: Multiplanar, multiecho pulse sequences of the brain and  surrounding structures were obtained without intravenous contrast. COMPARISON:  04/03/2009, correlation is also made with 01/30/2023 CT head FINDINGS: Evaluation is somewhat limited by motion artifact. Brain: No restricted diffusion to suggest acute or subacute infarct. An area of encephalomalacia in the right occipital lobe correlates with the hypodensity seen on the same-day CT. No acute hemorrhage, mass, mass effect, or midline shift. No hydrocephalus or extra-axial collection. Normal pituitary and craniocervical junction. No hemosiderin deposition to suggest remote hemorrhage. Advanced cerebral atrophy for age. Confluent T2 hyperintense signal in the periventricular white matter, likely the sequela of moderate chronic small vessel ischemic disease. Vascular: Normal arterial flow voids. Skull and upper cervical spine: Normal marrow signal. Sinuses/Orbits: Clear paranasal sinuses. No acute finding in the orbits. Other: Trace fluid in the mastoid air cells. IMPRESSION: No acute intracranial process. No evidence of acute or subacute infarct. A remote infarct correlates with the hypodensity seen on the same-day CT. Electronically Signed   By: Wiliam Ke M.D.   On: 01/30/2023 17:28   US Renal  Result Date: 01/30/2023 CLINICAL DATA:  Acute kidney injury EXAM: RENAL / URINARY TRACT ULTRASOUND COMPLETE COMPARISON:  None Available. FINDINGS: Right Kidney: Renal measurements: 8.8 x 4.4 x 4.9 = volume: 98.5 mL. Echogenicity within normal limits. No mass or hydronephrosis visualized. Left Kidney: Renal measurements: 10.6 x 5.6 x 4.7 = volume: 144.36 mL. Echogenicity within normal limits. No mass or hydronephrosis visualized. Bladder: Appears normal for degree of bladder distention. Other: Portions of the kidneys and bladder are obscured by overlapping bowel gas and soft tissue. Patient had difficulty tolerating the procedure as per the sonographer as well. IMPRESSION: No collecting system dilatation or perinephric  fluid. Electronically Signed   By: Karen Kays M.D.   On: 01/30/2023 16:40   CT HEAD WO CONTRAST  Result Date: 01/30/2023 CLINICAL DATA:  Decreased activity, recent pneumonia, history of dementia EXAM: CT HEAD WITHOUT CONTRAST TECHNIQUE: Contiguous axial images were obtained from the base of the skull through the vertex without intravenous contrast. RADIATION DOSE REDUCTION: This exam was performed according to the departmental dose-optimization program which includes automated exposure control, adjustment of the mA and/or kV according to patient size and/or use of iterative reconstruction technique. COMPARISON:  None Available. FINDINGS: Brain: Focal hypodensity in the right occipital lobe (series 3, image 16 and series 6, image 23), which may represent a subacute infarct. No evidence of acute hemorrhage, mass, mass effect, or midline shift. No hydrocephalus or extra-axial fluid collection. Ventriculomegaly, likely related to central atrophy, which is slightly more prominent left temporal lobe. Periventricular white matter changes, likely the sequela of chronic small vessel ischemic disease. Vascular: No hyperdense vessel. Skull: Negative for fracture or focal lesion. Sinuses/Orbits: No acute finding. Other: The mastoid air cells are well aerated. IMPRESSION:  Focal hypodensity in the right occipital lobe, which is technically age indeterminate but may represent a subacute infarct. An MRI is recommended for further evaluation. No acute intracranial hemorrhage. These results were called by telephone at the time of interpretation on 01/30/2023 at 1:53 pm to provider [facility] , who verbally acknowledged these results. Electronically Signed   By: Wiliam Ke M.D.   On: 01/30/2023 13:53   DG Chest Portable 1 View  Result Date: 01/30/2023 CLINICAL DATA:  Confusion EXAM: PORTABLE CHEST 1 VIEW COMPARISON:  Radiograph 11/29/2022 FINDINGS: Unchanged enlarged cardiac silhouette. There is no focal airspace  consolidation. There is no pleural effusion or evidence of pneumothorax. Mild thoracic spondylosis. No acute osseous abnormality. IMPRESSION: No evidence of acute cardiopulmonary disease. Electronically Signed   By: Caprice Renshaw M.D.   On: 01/30/2023 12:57    Microbiology: Results for orders placed or performed during the hospital encounter of 01/30/23  Resp panel by RT-PCR (RSV, Flu A&B, Covid) Anterior Nasal Swab     Status: None   Collection Time: 01/30/23 12:00 PM   Specimen: Anterior Nasal Swab  Result Value Ref Range Status   SARS Coronavirus 2 by RT PCR NEGATIVE NEGATIVE Final   Influenza A by PCR NEGATIVE NEGATIVE Final   Influenza B by PCR NEGATIVE NEGATIVE Final    Comment: (NOTE) The Xpert Xpress SARS-CoV-2/FLU/RSV plus assay is intended as an aid in the diagnosis of influenza from Nasopharyngeal swab specimens and should not be used as a sole basis for treatment. Nasal washings and aspirates are unacceptable for Xpert Xpress SARS-CoV-2/FLU/RSV testing.  Fact Sheet for Patients: BloggerCourse.com  Fact Sheet for Healthcare Providers: SeriousBroker.it  This test is not yet approved or cleared by the [place] FDA and has been authorized for detection and/or diagnosis of SARS-CoV-2 by FDA under an Emergency Use Authorization (EUA). This EUA will remain in effect (meaning this test can be used) for the duration of the COVID-19 declaration under Section 564(b)(1) of the Act, 21 U.S.C. section 360bbb-3(b)(1), unless the authorization is terminated or revoked.     Resp Syncytial Virus by PCR NEGATIVE NEGATIVE Final    Comment: (NOTE) Fact Sheet for Patients: BloggerCourse.com  Fact Sheet for Healthcare Providers: SeriousBroker.it  This test is not yet approved or cleared by the [place] FDA and has been authorized for detection and/or diagnosis of SARS-CoV-2 by FDA  under an Emergency Use Authorization (EUA). This EUA will remain in effect (meaning this test can be used) for the duration of the COVID-19 declaration under Section 564(b)(1) of the Act, 21 U.S.C. section 360bbb-3(b)(1), unless the authorization is terminated or revoked.  Performed at [facility], [address], [place], [place] [address]   MRSA Next Gen by PCR, Nasal     Status: Abnormal   Collection Time: 01/30/23  8:22 PM   Specimen: Nasal Mucosa; Nasal Swab  Result Value Ref Range Status   MRSA by PCR Next Gen DETECTED (A) NOT DETECTED Final    Comment: RESULT CALLED TO, READ BACK BY AND VERIFIED WITH: C. TAN RN 01/30/23 @ 2256 BY AB (NOTE) The GeneXpert MRSA Assay (FDA approved for NASAL specimens only), is one component of a comprehensive MRSA colonization surveillance program. It is not intended to diagnose MRSA infection nor to guide or monitor treatment for MRSA infections. Test performance is not FDA approved in patients less than 97 years old. Performed at [facility], [address] [place], [place] [address]  Labs: CBC: Recent Labs  Lab 01/30/23 1154 01/30/23 1237 01/31/23 0421 02/01/23 0259  WBC 13.7*  --  10.7* 11.0*  NEUTROABS 10.0*  --   --   --   HGB 10.2* 11.2* 9.4* 8.6*  HCT 33.1* 33.0* 29.2* 27.6*  MCV 87.8  --  85.6 87.6  PLT 377  --  303 301   Basic Metabolic Panel: Recent Labs  Lab 01/30/23 1154 01/30/23 1237 01/30/23 2112 01/31/23 0421 02/01/23 0259  NA 141 144  --  143 147*  K 4.1 4.0  --  3.5 3.1*  CL 105  --   --  109 118*  CO2 24  --   --  23 22  GLUCOSE 93  --   --  82 98  BUN 55*  --   --  47* 33*  CREATININE 2.27*  --   --  1.63* 1.23*  CALCIUM 10.0  --   --  9.3 8.9  MG  --   --  2.4  --   --    Liver Function Tests: Recent Labs  Lab 01/30/23 1154  AST 35  ALT 21  ALKPHOS 68  BILITOT 0.7  PROT 8.2*  ALBUMIN 2.6*   CBG: No results for input(s): "GLUCAP" in the last 168 hours.  Discharge time  spent: approximately 35 minutes spent on discharge counseling, evaluation of patient on day of discharge, and coordination of discharge planning with nursing, social work, pharmacy and case management  Signed: Alberteen Sam, MD [facility] 02/02/2023

## 2023-02-11 DEATH — deceased

## 2024-04-04 IMAGING — DX DG CHEST 2V
2 series · 2 of 2 positions shown · non-contrast
Comparison: Chest x-ray 08/02/2021.

CLINICAL DATA: 75-year-old female with history of dyspnea.
Bilateral lower extremity edema.

EXAM:
CHEST - 2 VIEW

[chest pa]
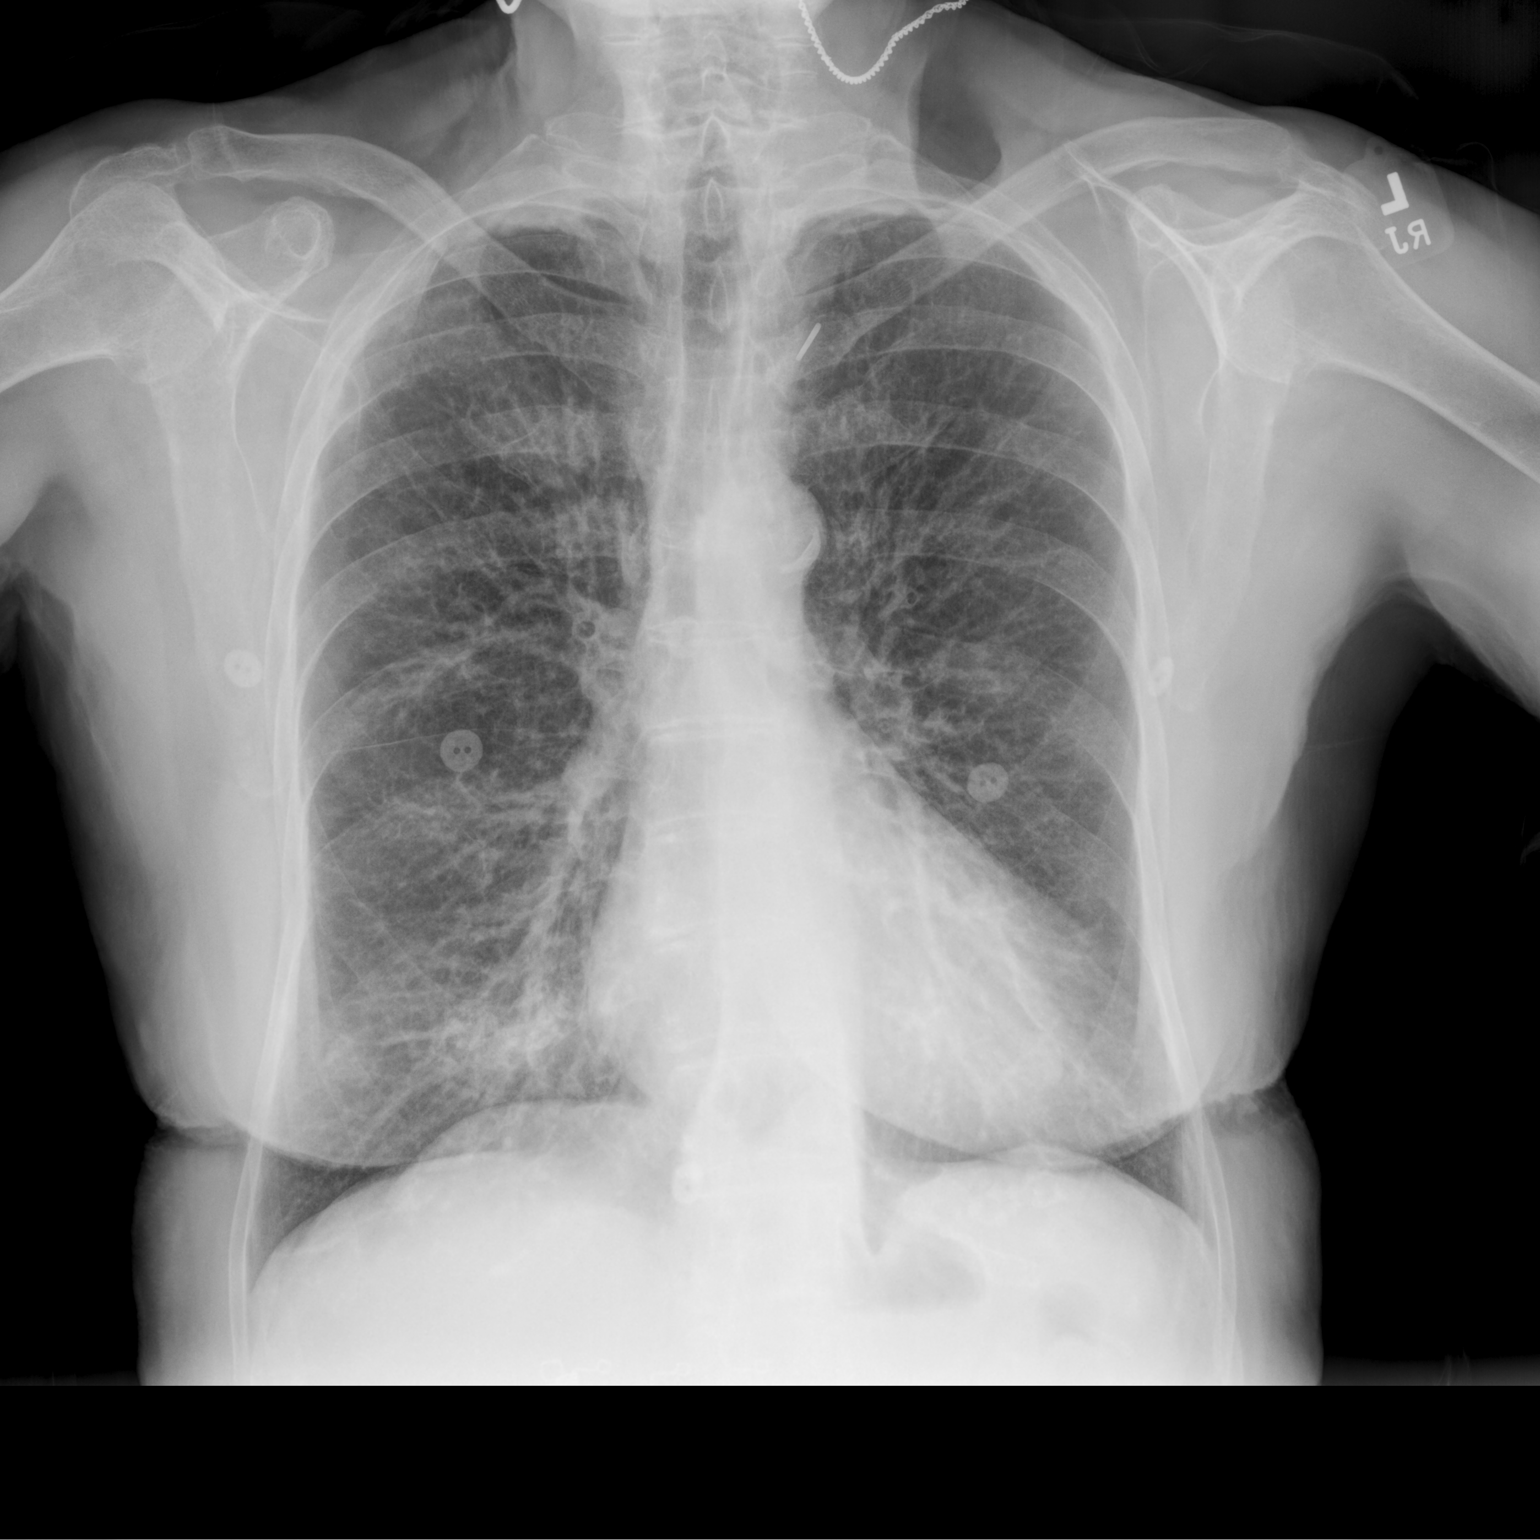

[chest lat]
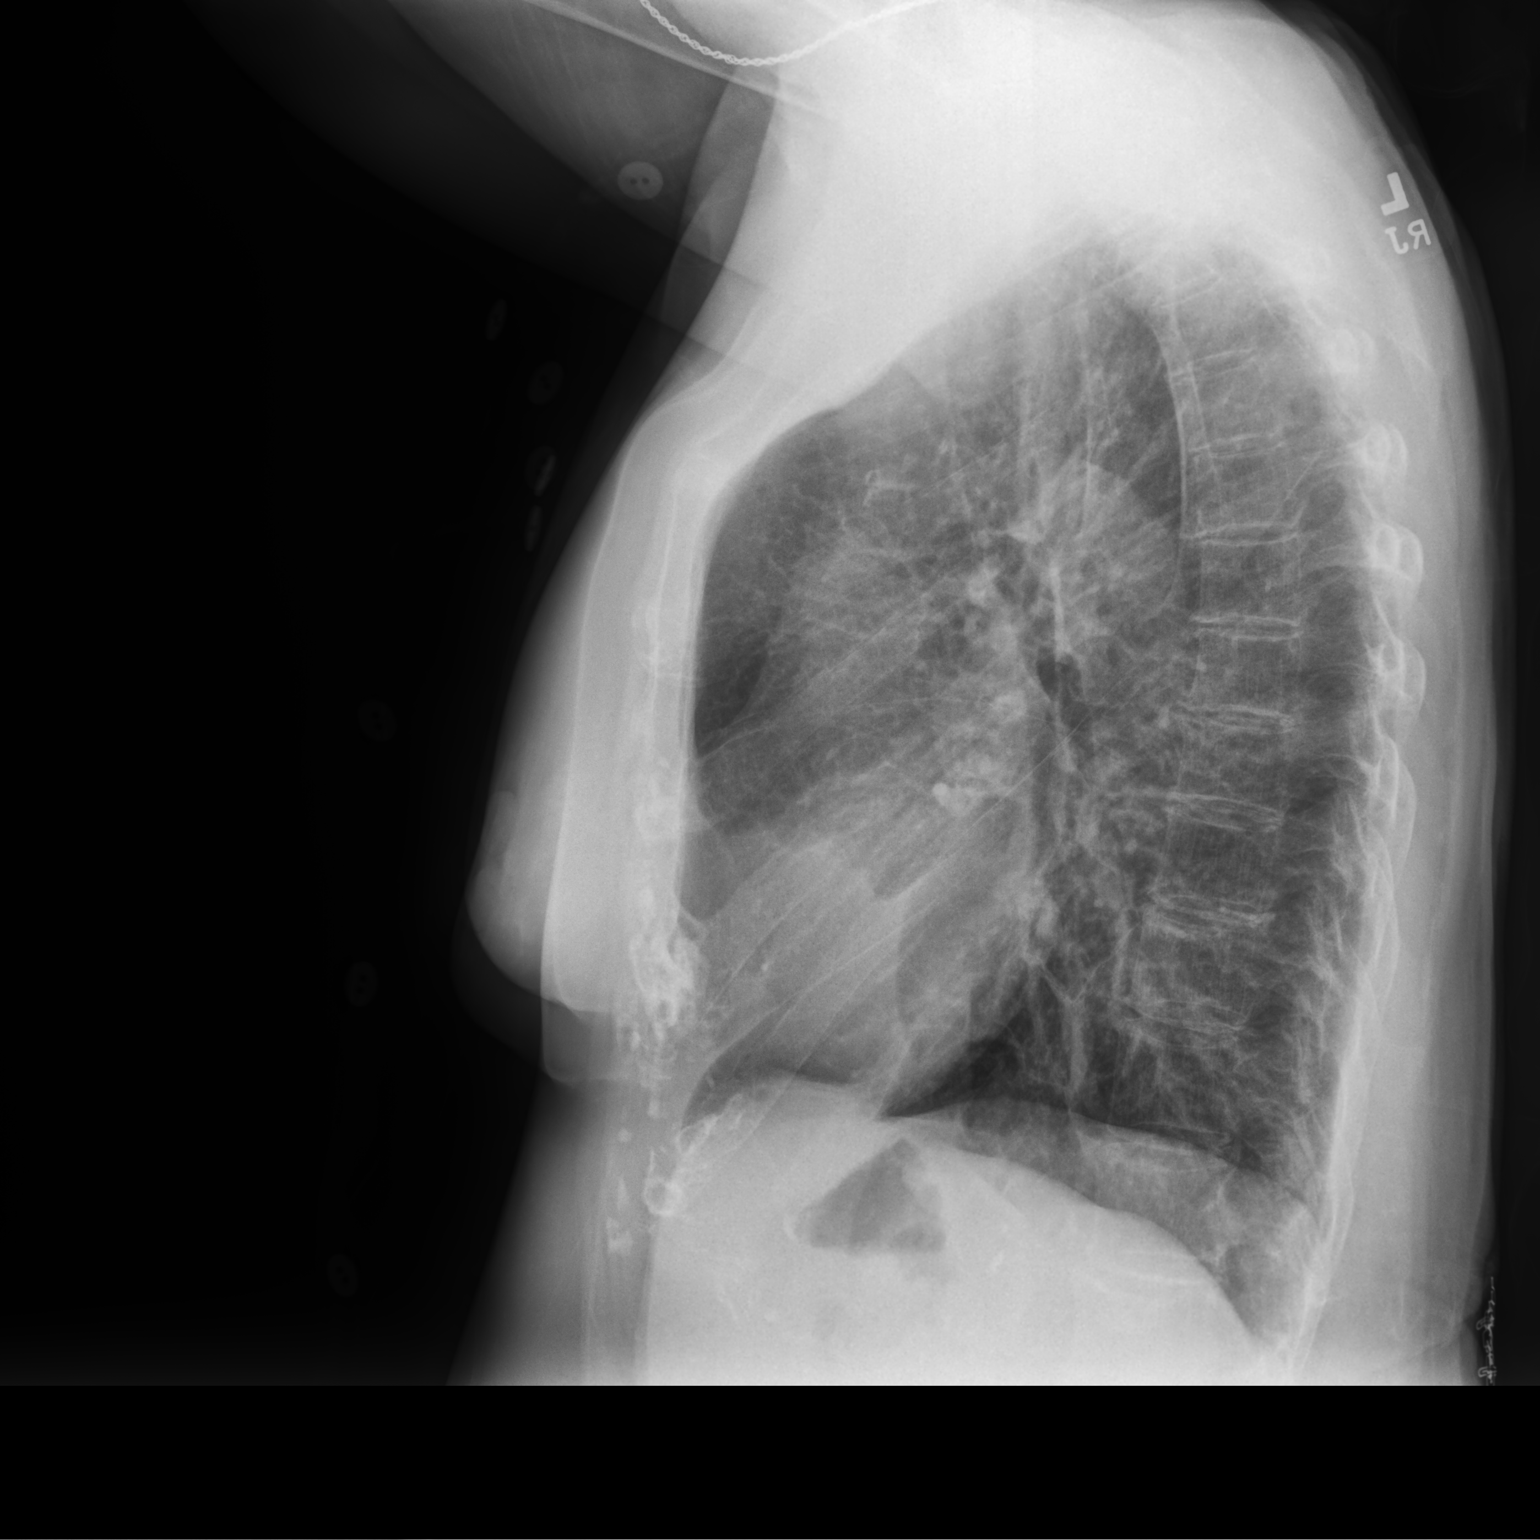

[2 of 2 positions shown; findings below may reference images not displayed]

FINDINGS: Lung volumes are normal. No consolidative airspace disease.
Widespread areas of interstitial prominence and diffuse
peribronchial cuffing. Ill-defined opacity in the right middle lobe.
No pleural effusions. No pneumothorax. No pulmonary nodule or mass
noted. Pulmonary vasculature and the cardiomediastinal silhouette
are within normal limits. Atherosclerosis in the thoracic aorta.
IMPRESSION: 1. The appearance the chest suggests severe bronchitis, potentially
with right middle lobe bronchopneumonia. Followup PA and lateral
chest X-ray is recommended in 3-4 weeks following trial of
antibiotic therapy to ensure resolution and exclude underlying
malignancy.
2. Aortic atherosclerosis.

## 2024-04-17 IMAGING — CT CT CHEST W/ CM
2 of 4 series · 15 of 36 positions shown, 18 images · IV contrast (OMNIPAQUE 300)
Comparison: Chest radiographs 01/29/2022 and 08/02/2021.

CLINICAL DATA: Abnormal x-ray. Increased shortness of breath with
exertion. History of chronic obstructive pulmonary disease.

EXAM:
CT CHEST WITH CONTRAST
TECHNIQUE: Multidetector CT imaging of the chest was performed during
intravenous contrast administration.

[Series 2: thorax · axial · 0.62mm/px · z∈[-287,-43]mm · 12 of 146 slices shown, 15 images]
[im 12/146  mediastinal]
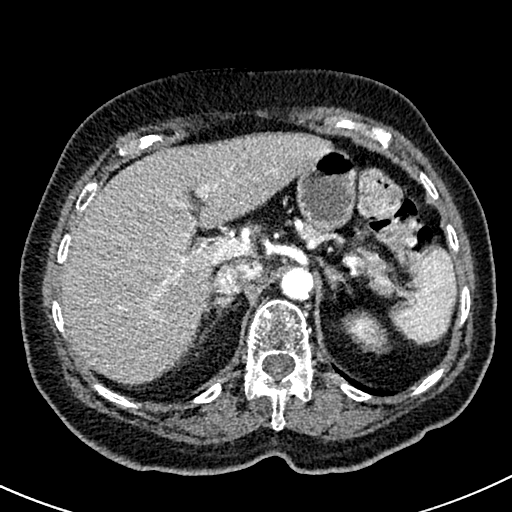
[im 12/146  lung]
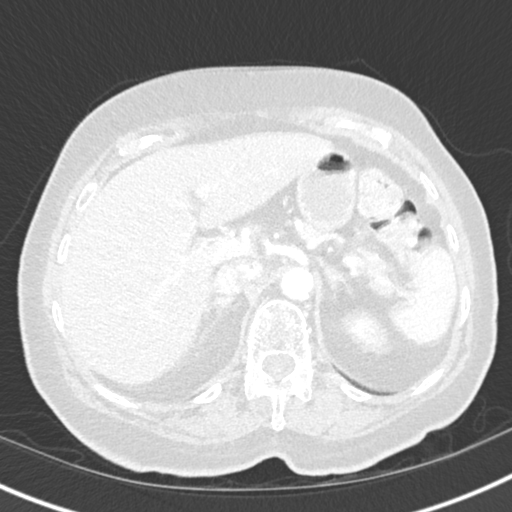
[im 23/146  lung]
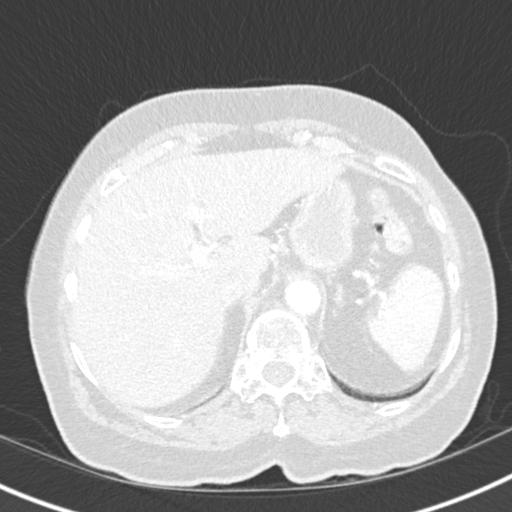
[im 34/146  lung]
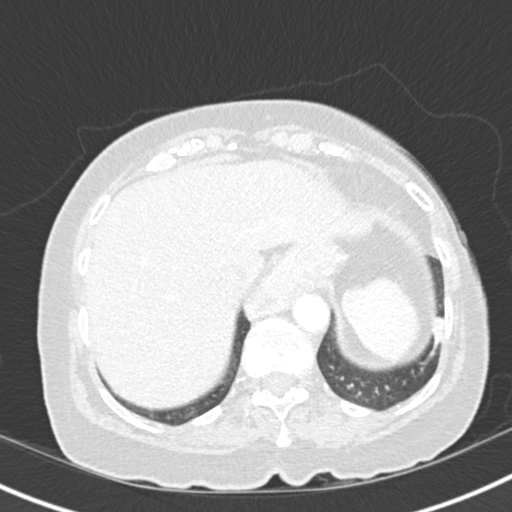
[im 45/146  lung]
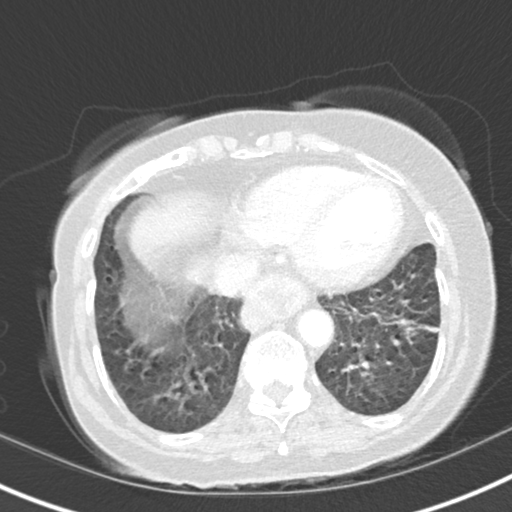
[im 56/146  mediastinal]
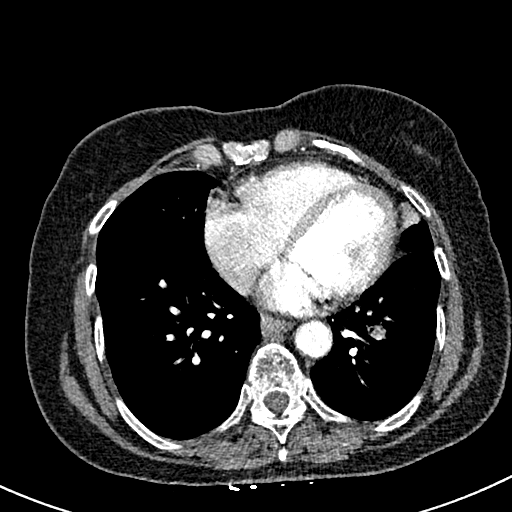
[im 56/146  lung]
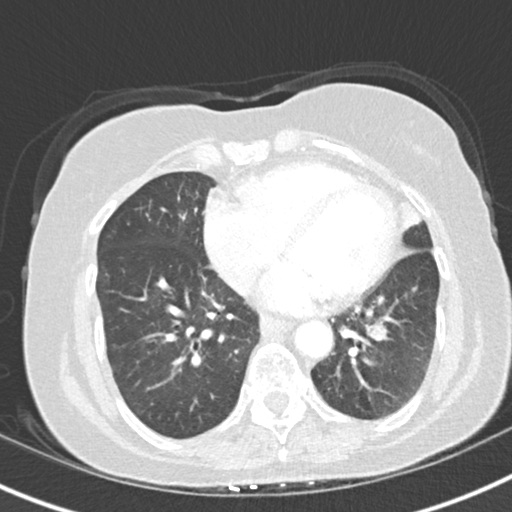
[im 67/146  lung]
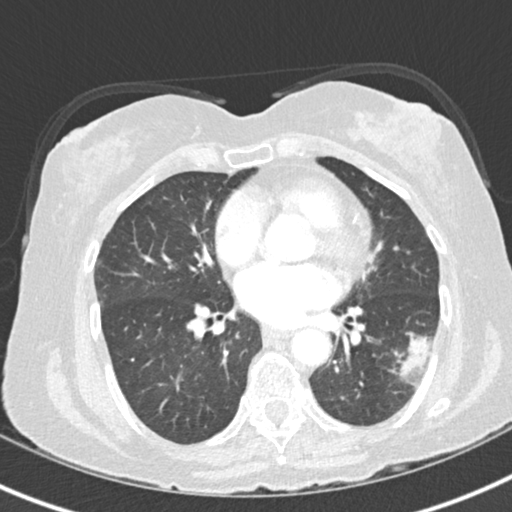
[im 79/146  lung]
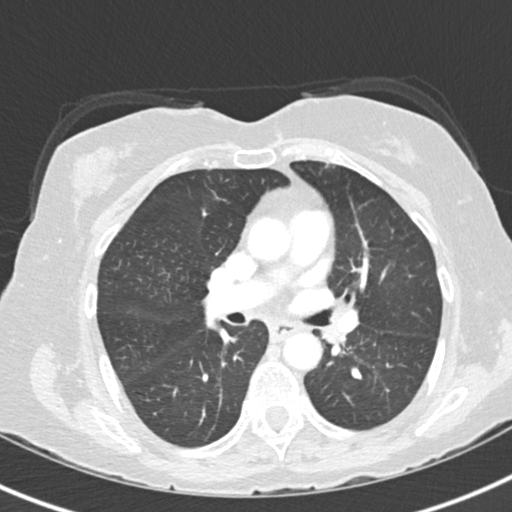
[im 90/146  lung]
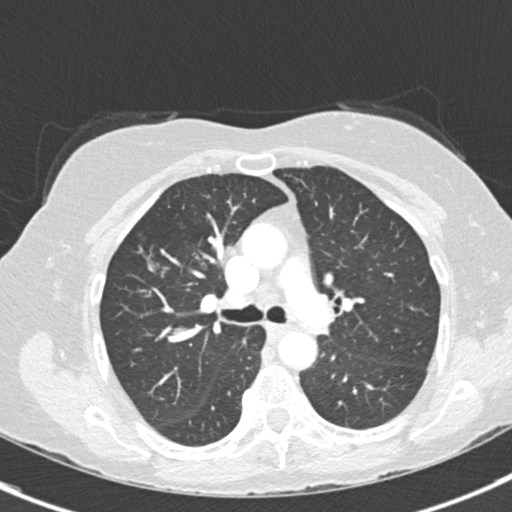
[im 101/146  mediastinal]
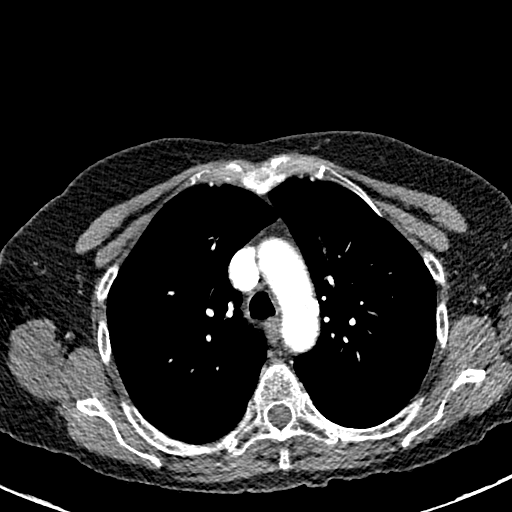
[im 101/146  lung]
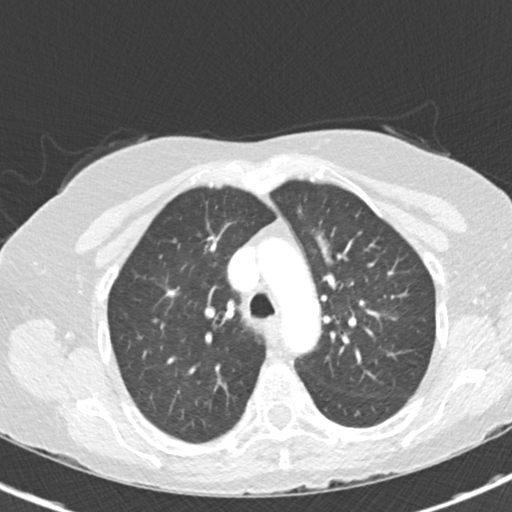
[im 112/146  lung]
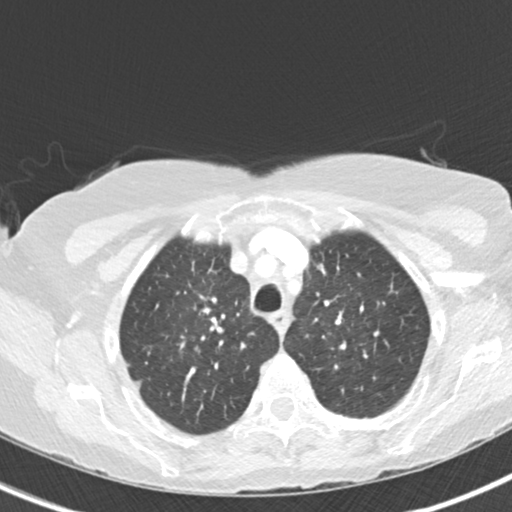
[im 123/146  lung]
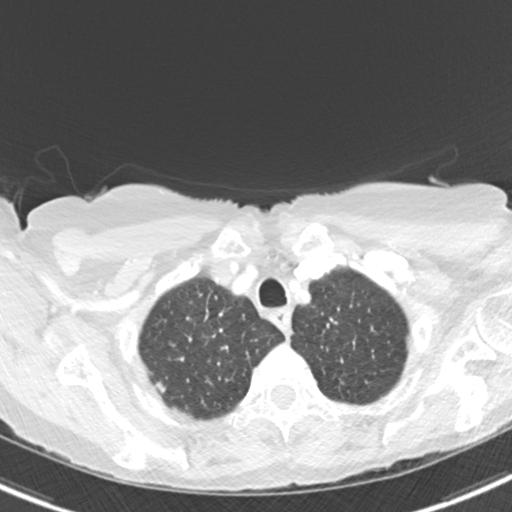
[im 134/146  lung]
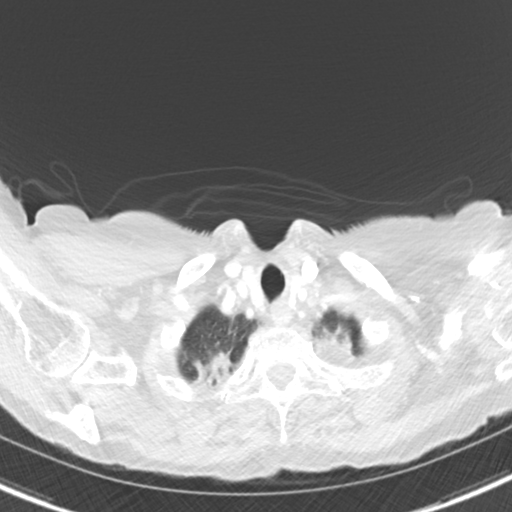

[Series 5: coronal · coronal · 0.59mm/px · 3 of 108 slices shown]
[im 22/108  lung]
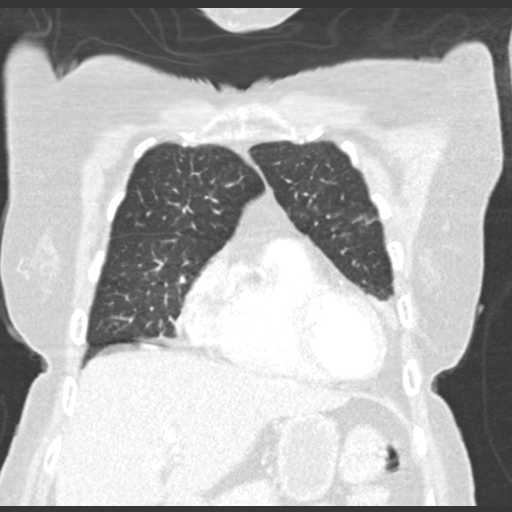
[im 43/108  lung]
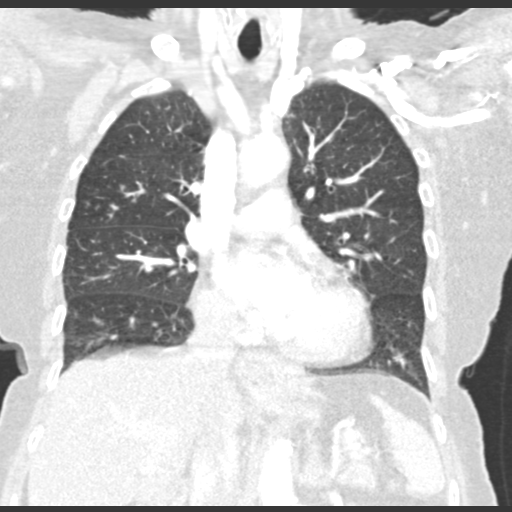
[im 65/108  lung]
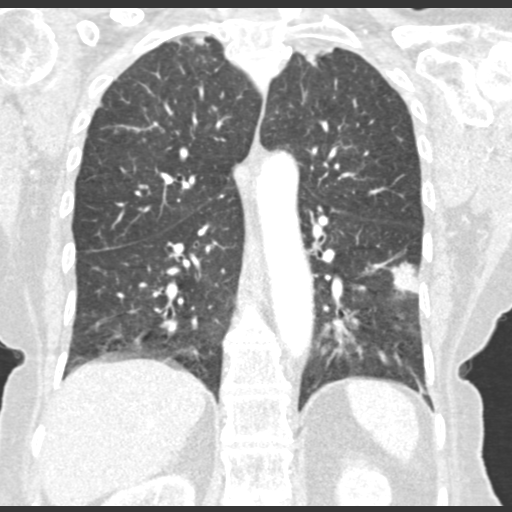

[15 of 36 positions shown; findings below may reference images not displayed]

RADIATION DOSE REDUCTION: This exam was performed according to the
departmental dose-optimization program which includes automated
exposure control, adjustment of the mA and/or kV according to
patient size and/or use of iterative reconstruction technique.

CONTRAST:  80mL OMNIPAQUE IOHEXOL 300 MG/ML  SOLN
FINDINGS: Cardiovascular: Atherosclerosis of the aorta, great vessels and
coronary arteries with irregular partially calcified plaque in the
proximal left subclavian artery. No acute vascular findings. No
evidence of acute pulmonary embolism. The heart size is normal.
There is no pericardial effusion.

Mediastinum/Nodes: There are no enlarged mediastinal, hilar or
axillary lymph nodes.Small to moderate hiatal hernia with mild
distal esophageal wall thickening. The thyroid gland and trachea
appear unremarkable.

Lungs/Pleura: No pleural effusion or pneumothorax. Mild biapical
scarring, similar to prior radiographs. There is diffuse central
airway thickening with scattered clustered micro nodularity in both
lungs, most notable in the right upper and lower lobes. There is an
ill-defined nodule or focal infiltrate in the left lower lobe which
measures 2.1 x 1.3 cm on image 79/3. This is visible on the scout
image, not apparent on the recent radiographs and most likely
represents focal pneumonia. There is mild surrounding clustered
nodularity. There is an additional ill-defined nodular density more
centrally in the left lower lobe measuring 1.2 cm on image 90/3.
Probable areas of scarring or atelectasis medially in the right
middle lobe and lingula and laterally in the left lower lobe.

Upper abdomen: No acute findings are seen within the visualized
upper abdomen. No adrenal mass.

Musculoskeletal/Chest wall: There is no chest wall mass or
suspicious osseous finding.
IMPRESSION: 1. New focal left lower lobe airspace disease compared with recent
radiographs, most consistent with pneumonia. There are additional
scattered clustered nodules in both lungs as can be seen with
atypical mycobacterial infection. Findings are atypical for
malignancy. Recommend chest CT follow-in 3 months.
2. No adenopathy, pleural effusion or acute vascular findings.
3. Hiatal hernia with distal esophageal wall thickening.
4. Coronary and Aortic Atherosclerosis ([id number]).
